# Patient Record
Sex: Female | Born: 1945
Health system: Southern US, Community
[De-identification: ages and names within clinical notes are randomized; demographics above are authoritative.]

## PROBLEM LIST (undated history)

## (undated) DIAGNOSIS — M810 Age-related osteoporosis without current pathological fracture: Secondary | ICD-10-CM

## (undated) DIAGNOSIS — K219 Gastro-esophageal reflux disease without esophagitis: Secondary | ICD-10-CM

## (undated) DIAGNOSIS — M5030 Other cervical disc degeneration, unspecified cervical region: Secondary | ICD-10-CM

## (undated) DIAGNOSIS — H269 Unspecified cataract: Secondary | ICD-10-CM

## (undated) DIAGNOSIS — T7840XA Allergy, unspecified, initial encounter: Secondary | ICD-10-CM

## (undated) DIAGNOSIS — N200 Calculus of kidney: Secondary | ICD-10-CM

## (undated) DIAGNOSIS — M1990 Unspecified osteoarthritis, unspecified site: Secondary | ICD-10-CM

## (undated) DIAGNOSIS — N35919 Unspecified urethral stricture, male, unspecified site: Secondary | ICD-10-CM

## (undated) DIAGNOSIS — I4510 Unspecified right bundle-branch block: Secondary | ICD-10-CM

## (undated) DIAGNOSIS — R001 Bradycardia, unspecified: Secondary | ICD-10-CM

## (undated) DIAGNOSIS — F32A Depression, unspecified: Secondary | ICD-10-CM

## (undated) DIAGNOSIS — E7800 Pure hypercholesterolemia, unspecified: Secondary | ICD-10-CM

## (undated) DIAGNOSIS — F419 Anxiety disorder, unspecified: Secondary | ICD-10-CM

## (undated) DIAGNOSIS — R7303 Prediabetes: Secondary | ICD-10-CM

## (undated) DIAGNOSIS — I471 Supraventricular tachycardia, unspecified: Secondary | ICD-10-CM

## (undated) DIAGNOSIS — E039 Hypothyroidism, unspecified: Secondary | ICD-10-CM

## (undated) DIAGNOSIS — K317 Polyp of stomach and duodenum: Secondary | ICD-10-CM

## (undated) DIAGNOSIS — M778 Other enthesopathies, not elsewhere classified: Secondary | ICD-10-CM

## (undated) HISTORY — DX: Unspecified cataract: H26.9

## (undated) HISTORY — PX: BREAST SURGERY: SHX581

## (undated) HISTORY — PX: EYE SURGERY: SHX253

## (undated) HISTORY — DX: Anxiety disorder, unspecified: F41.9

## (undated) HISTORY — PX: THYROIDECTOMY, PARTIAL: SHX18

## (undated) HISTORY — DX: Other enthesopathies, not elsewhere classified: M77.8

## (undated) HISTORY — PX: HAMMER TOE SURGERY: SHX385

## (undated) HISTORY — PX: BREAST CYST ASPIRATION: SHX578

## (undated) HISTORY — PX: DILATION AND CURETTAGE OF UTERUS: SHX78

## (undated) HISTORY — PX: TONSILLECTOMY: SUR1361

## (undated) HISTORY — PX: BREAST BIOPSY: SHX20

## (undated) HISTORY — DX: Depression, unspecified: F32.A

## (undated) HISTORY — DX: Allergy, unspecified, initial encounter: T78.40XA

## (undated) HISTORY — DX: Unspecified right bundle-branch block: I45.10

---

## 1965-05-18 HISTORY — PX: OVARIAN CYST REMOVAL: SHX89

## 2000-11-01 ENCOUNTER — Other Ambulatory Visit: Admission: RE | Admit: 2000-11-01 | Discharge: 2000-11-01 | Payer: Self-pay | Admitting: Family Medicine

## 2004-03-17 ENCOUNTER — Encounter: Payer: Self-pay | Admitting: Orthopedic Surgery

## 2004-03-18 ENCOUNTER — Encounter: Payer: Self-pay | Admitting: Orthopedic Surgery

## 2004-04-17 ENCOUNTER — Encounter: Payer: Self-pay | Admitting: Orthopedic Surgery

## 2004-05-18 ENCOUNTER — Encounter: Payer: Self-pay | Admitting: Orthopedic Surgery

## 2004-06-23 ENCOUNTER — Ambulatory Visit: Payer: Self-pay | Admitting: Family Medicine

## 2005-04-23 ENCOUNTER — Ambulatory Visit: Payer: Self-pay | Admitting: Unknown Physician Specialty

## 2005-04-30 ENCOUNTER — Ambulatory Visit: Payer: Self-pay | Admitting: Unknown Physician Specialty

## 2005-07-29 ENCOUNTER — Ambulatory Visit: Payer: Self-pay | Admitting: Unknown Physician Specialty

## 2005-09-14 ENCOUNTER — Ambulatory Visit: Payer: Self-pay | Admitting: Unknown Physician Specialty

## 2005-11-02 ENCOUNTER — Ambulatory Visit: Payer: Self-pay

## 2005-11-06 ENCOUNTER — Ambulatory Visit: Payer: Self-pay

## 2006-04-26 ENCOUNTER — Ambulatory Visit: Payer: Self-pay

## 2006-12-03 ENCOUNTER — Ambulatory Visit: Payer: Self-pay | Admitting: Family Medicine

## 2007-01-03 ENCOUNTER — Ambulatory Visit: Payer: Self-pay | Admitting: Unknown Physician Specialty

## 2007-02-07 ENCOUNTER — Ambulatory Visit: Payer: Self-pay | Admitting: Family Medicine

## 2008-02-17 ENCOUNTER — Ambulatory Visit: Payer: Self-pay | Admitting: Family Medicine

## 2008-10-16 DIAGNOSIS — M810 Age-related osteoporosis without current pathological fracture: Secondary | ICD-10-CM | POA: Insufficient documentation

## 2008-10-16 DIAGNOSIS — E7800 Pure hypercholesterolemia, unspecified: Secondary | ICD-10-CM | POA: Insufficient documentation

## 2008-10-16 DIAGNOSIS — E010 Iodine-deficiency related diffuse (endemic) goiter: Secondary | ICD-10-CM | POA: Insufficient documentation

## 2009-02-12 ENCOUNTER — Other Ambulatory Visit: Payer: Self-pay | Admitting: Family Medicine

## 2009-02-22 ENCOUNTER — Ambulatory Visit: Payer: Self-pay | Admitting: Family Medicine

## 2009-04-30 ENCOUNTER — Other Ambulatory Visit: Payer: Self-pay | Admitting: Family Medicine

## 2009-05-06 ENCOUNTER — Ambulatory Visit: Payer: Self-pay | Admitting: General Practice

## 2009-06-28 ENCOUNTER — Emergency Department: Payer: Self-pay | Admitting: Emergency Medicine

## 2010-02-26 ENCOUNTER — Ambulatory Visit: Payer: Self-pay | Admitting: Family Medicine

## 2010-04-04 ENCOUNTER — Ambulatory Visit: Payer: Self-pay | Admitting: Family Medicine

## 2011-03-04 ENCOUNTER — Ambulatory Visit: Payer: Self-pay | Admitting: Family Medicine

## 2011-11-05 LAB — HM PAP SMEAR: HM Pap smear: NEGATIVE

## 2012-03-15 ENCOUNTER — Ambulatory Visit: Payer: Self-pay | Admitting: Family Medicine

## 2012-03-25 ENCOUNTER — Ambulatory Visit: Payer: Self-pay | Admitting: Unknown Physician Specialty

## 2012-03-25 LAB — HM COLONOSCOPY

## 2012-03-28 LAB — PATHOLOGY REPORT

## 2012-04-27 ENCOUNTER — Ambulatory Visit: Payer: Self-pay | Admitting: Family Medicine

## 2012-06-27 ENCOUNTER — Other Ambulatory Visit: Payer: Self-pay | Admitting: Family Medicine

## 2012-06-27 LAB — CBC WITH DIFFERENTIAL/PLATELET
Basophil #: 0 10*3/uL (ref 0.0–0.1)
Basophil %: 0.7 %
Eosinophil #: 0 10*3/uL (ref 0.0–0.7)
Eosinophil %: 0.8 %
HCT: 39.9 % (ref 35.0–47.0)
HGB: 13.6 g/dL (ref 12.0–16.0)
Lymphocyte #: 2 10*3/uL (ref 1.0–3.6)
Lymphocyte %: 33.6 %
MCH: 31 pg (ref 26.0–34.0)
MCHC: 34.1 g/dL (ref 32.0–36.0)
MCV: 91 fL (ref 80–100)
Monocyte #: 0.6 x10 3/mm (ref 0.2–0.9)
Monocyte %: 9.8 %
Neutrophil #: 3.2 10*3/uL (ref 1.4–6.5)
Neutrophil %: 55.1 %
Platelet: 285 10*3/uL (ref 150–440)
RBC: 4.39 10*6/uL (ref 3.80–5.20)
RDW: 12.8 % (ref 11.5–14.5)
WBC: 5.9 10*3/uL (ref 3.6–11.0)

## 2012-06-27 LAB — COMPREHENSIVE METABOLIC PANEL
Albumin: 3.9 g/dL (ref 3.4–5.0)
Alkaline Phosphatase: 124 U/L (ref 50–136)
Anion Gap: 3 — ABNORMAL LOW (ref 7–16)
BUN: 23 mg/dL — ABNORMAL HIGH (ref 7–18)
Bilirubin,Total: 0.3 mg/dL (ref 0.2–1.0)
Calcium, Total: 8.7 mg/dL (ref 8.5–10.1)
Chloride: 105 mmol/L (ref 98–107)
Co2: 31 mmol/L (ref 21–32)
Creatinine: 0.69 mg/dL (ref 0.60–1.30)
EGFR (African American): >60
EGFR (Non-African Amer.): >60
Glucose: 85 mg/dL (ref 65–99)
Osmolality: 280 (ref 275–301)
Potassium: 3.9 mmol/L (ref 3.5–5.1)
SGOT(AST): 20 U/L (ref 15–37)
SGPT (ALT): 18 U/L (ref 12–78)
Sodium: 139 mmol/L (ref 136–145)
Total Protein: 7.1 g/dL (ref 6.4–8.2)

## 2012-06-27 LAB — TSH: Thyroid Stimulating Horm: 3.64 u[IU]/mL

## 2012-06-27 LAB — T4, FREE: Free Thyroxine: 0.87 ng/dL (ref 0.76–1.46)

## 2013-01-20 ENCOUNTER — Ambulatory Visit: Payer: Self-pay | Admitting: Gastroenterology

## 2013-03-16 ENCOUNTER — Ambulatory Visit: Payer: Self-pay | Admitting: Family Medicine

## 2014-02-01 ENCOUNTER — Ambulatory Visit: Payer: Self-pay | Admitting: Family Medicine

## 2014-02-01 LAB — LIPID PANEL
Cholesterol: 205 mg/dL — ABNORMAL HIGH (ref 0–200)
HDL Cholesterol: 56 mg/dL (ref 40–60)
Ldl Cholesterol, Calc: 117 mg/dL — ABNORMAL HIGH (ref 0–100)
Triglycerides: 160 mg/dL (ref 0–200)
VLDL Cholesterol, Calc: 32 mg/dL (ref 5–40)

## 2014-03-19 ENCOUNTER — Ambulatory Visit: Payer: Self-pay | Admitting: Family Medicine

## 2014-03-19 LAB — HM MAMMOGRAPHY

## 2014-09-07 DIAGNOSIS — M1990 Unspecified osteoarthritis, unspecified site: Secondary | ICD-10-CM | POA: Insufficient documentation

## 2014-09-07 DIAGNOSIS — F419 Anxiety disorder, unspecified: Secondary | ICD-10-CM | POA: Insufficient documentation

## 2014-09-07 DIAGNOSIS — G4700 Insomnia, unspecified: Secondary | ICD-10-CM | POA: Insufficient documentation

## 2014-09-07 DIAGNOSIS — R42 Dizziness and giddiness: Secondary | ICD-10-CM | POA: Insufficient documentation

## 2014-09-07 DIAGNOSIS — R928 Other abnormal and inconclusive findings on diagnostic imaging of breast: Secondary | ICD-10-CM | POA: Insufficient documentation

## 2014-09-07 DIAGNOSIS — R7303 Prediabetes: Secondary | ICD-10-CM | POA: Insufficient documentation

## 2014-09-07 DIAGNOSIS — J309 Allergic rhinitis, unspecified: Secondary | ICD-10-CM | POA: Insufficient documentation

## 2014-09-07 DIAGNOSIS — F32A Depression, unspecified: Secondary | ICD-10-CM | POA: Insufficient documentation

## 2014-09-07 DIAGNOSIS — F329 Major depressive disorder, single episode, unspecified: Secondary | ICD-10-CM | POA: Insufficient documentation

## 2014-09-07 DIAGNOSIS — R9431 Abnormal electrocardiogram [ECG] [EKG]: Secondary | ICD-10-CM | POA: Insufficient documentation

## 2014-10-08 ENCOUNTER — Other Ambulatory Visit: Payer: Self-pay

## 2014-10-08 NOTE — Patient Outreach (Signed)
[facility]) Care Management  [place]  10/08/2014   Sonya Barnes 1945-12-14 [id number]  Subjective: Patient comes to me today, pale and talking the entire time about the stress she's under.  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  Deziya forgot her blood sugar records but reports them as 80-90 in the am and 80-184m/dl, 2 hours after a meal.  She is not exercising or eating healthy like she normally does.  Objective: weight 139.5lbs, BP 132/68  Current Medications:  Current Outpatient Prescriptions  Medication Sig Dispense Refill  . ALPRAZolam (XANAX) 0.5 MG tablet Take 1 tablet by mouth daily. PRN    . cetirizine (ZYRTEC) 10 MG tablet Take 1 tablet by mouth daily.    . Diphenhydramine-APAP, sleep, 25-500 MG CAPS Take 1 tablet by mouth at bedtime as needed.    . fluticasone (FLONASE) 50 MCG/ACT nasal spray Place 2 sprays into the nose daily. 2 sprays each nostril    . Omeprazole 20 MG TBEC Take 1 capsule by mouth daily.    . sertraline (ZOLOFT) 50 MG tablet Take 1.5 tablets by mouth daily.    . Blood Glucose Monitoring Suppl (ACCU-CHEK AVIVA CONNECT) W/DEVICE KIT ACCU-CHEK AVIVA (In Vitro Strip)  1 Strip Use to check blood sugar daily as instructed for 0 days  Quantity: 50;  Refills: 2   Ordered :22-May-2014  MMargarita RanaMD;  Started 22-May-2014 Active    . NEOMYCIN-POLYMYXIN-HYDROCORTISONE (CORTISPORIN) 1 % SOLN otic solution Place in ear(s).    . ranitidine (ZANTAC) 150 MG tablet Take 1 tablet by mouth daily. As needed     No current facility-administered medications for this visit.    Functional Status:  In your present state of health, do you have any difficulty performing the following activities: 10/08/2014  Hearing? N  Vision? N  Difficulty concentrating or making decisions? N   Walking or climbing stairs? N  Dressing or bathing? N  Doing errands, shopping? N    Fall/Depression Screening: PHQ 2/9 Scores 10/08/2014  PHQ - 2 Score 2    Assessment/plan: patient is emotionally drained and not willing to add exercise at this time.  She is not following the meal plan guidelines for diabetes and is not willing to make changes at this time.  She will discuss her feelings of depression with MD at the next visit and may discuss the option of additional medications or increasing her dose of Zoloft at her next MD visit.   DMayceeis [facility] therefore I will not see her again.   [facility]        Patient Outreach from 10/08/2014 in TAnnaProblem One  Potential for elevated blood sugars as a result of stress   Care Plan for Problem One  Active   [facility] Long Term Goal (31-90 days)  Patient will maintain an A1C less than 6%   [facility] Long Term Goal Start Date  10/08/14   Interventions for Problem One Long Term Goal  - continue to check blood sugars through the week [Patient is emotionally drained at this time- not willing to make dietary or exercise changes at this time. ]      JGentry Fitz RN, BA, MHA,  [place] Direct Dial:  [phone]  Fax:  [phone] E-mail: [email or web address] [address], [place], [place]  [address]

## 2014-10-25 ENCOUNTER — Ambulatory Visit (INDEPENDENT_AMBULATORY_CARE_PROVIDER_SITE_OTHER): Payer: 59 | Admitting: Family Medicine

## 2014-10-25 ENCOUNTER — Encounter: Payer: Self-pay | Admitting: Family Medicine

## 2014-10-25 VITALS — BP 110/64 | HR 58 | Temp 97.7°F | Resp 16 | Ht 65.0 in | Wt 138.0 lb

## 2014-10-25 DIAGNOSIS — E049 Nontoxic goiter, unspecified: Secondary | ICD-10-CM | POA: Diagnosis not present

## 2014-10-25 DIAGNOSIS — E7800 Pure hypercholesterolemia, unspecified: Secondary | ICD-10-CM

## 2014-10-25 DIAGNOSIS — R739 Hyperglycemia, unspecified: Secondary | ICD-10-CM

## 2014-10-25 DIAGNOSIS — E010 Iodine-deficiency related diffuse (endemic) goiter: Secondary | ICD-10-CM

## 2014-10-25 DIAGNOSIS — Z0000 Encounter for general adult medical examination without abnormal findings: Secondary | ICD-10-CM | POA: Diagnosis not present

## 2014-10-25 NOTE — Progress Notes (Signed)
Patient ID: Sonya Barnes, female   DOB: 10/25/1945, 69 y.o.   MRN: [id number] Patient: Sonya Barnes, Female    DOB: 08/22/1945, 69 y.o.   MRN: [id number] Visit Date: 10/25/2014  Today's Provider: Nancy Maloney, MD   Chief Complaint  Patient presents with  . Medicare Wellness   Subjective:    Annual wellness visit Sonya Barnes is a 69 y.o. female who presents today for her Subsequent Annual Wellness Visit. She feels well. She reports exercising 3 times a week. She reports she is sleeping fairly well. 01/26/14 CPE 03/25/12 Colonoscopy- diverticulosis 03/19/14 mammo-BI-RADS 1 11/05/11 Pap-neg HPV-NEG 09/24/10 EKG  -----------------------------------------------------------   Review of Systems  Constitutional: Negative.   HENT: Negative.   Eyes: Negative.   Respiratory: Negative.   Cardiovascular: Negative.   Gastrointestinal: Negative.   Endocrine: Negative.   Genitourinary: Negative.   Musculoskeletal: Negative.   Skin: Negative.   Allergic/Immunologic: Negative.   Neurological: Negative.   Hematological: Negative.   Psychiatric/Behavioral: Negative.     History   Social History  . Marital Status: Married    Spouse Name: Mike  . Number of Children: 2  . Years of Education: N/A   Occupational History  . Full time [employer]   Social History Main Topics  . Smoking status: Never Smoker   . Smokeless tobacco: Never Used  . Alcohol Use: No  . Drug Use: No  . Sexual Activity: Not on file   Other Topics Concern  . Not on file   Social History Narrative    Patient Active Problem List   Diagnosis Date Noted  . Abnormal ECG 09/07/2014  . Abnormal finding on mammography 09/07/2014  . Allergic rhinitis 09/07/2014  . Anxiety 09/07/2014  . Arthritis 09/07/2014  . Clinical depression 09/07/2014  . Blood glucose elevated 09/07/2014  . Cannot sleep 09/07/2014  . Head revolving around 09/07/2014  . Bone/cartilage disorder 10/16/2008  . Big thyroid 10/16/2008  .  Hypercholesteremia 10/16/2008    Past Surgical History  Procedure Laterality Date  . Total thyroidectomy Right 2007  . Ovarian cyst removal  1967  . Tonsillectomy    . Hammer toe surgery Bilateral   . Breast surgery Left     biopsy    Her family history includes Arthritis in her mother; Cancer in her mother; Diabetes in her father; Early death in her brother; Heart disease in her father and sister; Liver disease in her brother; Lung disease in her mother and sister; Mental illness in her brother; Stroke in her father.    Previous Medications   ALPRAZOLAM (XANAX) 0.5 MG TABLET    Take 1 tablet by mouth daily. PRN   BLOOD GLUCOSE MONITORING SUPPL (ACCU-CHEK AVIVA CONNECT) W/DEVICE KIT    ACCU-CHEK AVIVA (In Vitro Strip)  1 Strip Use to check blood sugar daily as instructed for 0 days  Quantity: 50;  Refills: 2   Ordered :22-May-2014  Maloney, Nancy MD;  Started 22-May-2014 Active   CETIRIZINE (ZYRTEC) 10 MG TABLET    Take 1 tablet by mouth daily.   DIPHENHYDRAMINE-APAP, SLEEP, 25-500 MG CAPS    Take 1 tablet by mouth at bedtime as needed.   FLUTICASONE (FLONASE) 50 MCG/ACT NASAL SPRAY    Place 2 sprays into the nose daily. 2 sprays each nostril   NEOMYCIN-POLYMYXIN-HYDROCORTISONE (CORTISPORIN) 1 % SOLN OTIC SOLUTION    Place in ear(s).   OMEPRAZOLE 20 MG TBEC    Take 1 capsule by mouth daily.   RANITIDINE (ZANTAC) 150 MG   TABLET    Take 1 tablet by mouth daily. As needed   SERTRALINE (ZOLOFT) 50 MG TABLET    Take 1.5 tablets by mouth daily.    Patient Care Team: Nancy Maloney, MD as PCP - General (Family Medicine) Julie M Montpellier, RN as Registered Nurse    No Known Allergies   Objective:   Vitals: BP 110/64 mmHg  Pulse 58  Temp(Src) 97.7 F (36.5 C) (Oral)  Resp 16  Ht 5' 5" (1.651 m)  Wt 138 lb (62.596 kg)  BMI 22.96 kg/m2  SpO2 97%  Physical Exam  Constitutional: She is oriented to person, place, and time. She appears well-developed and well-nourished.  HENT:  Head:  Normocephalic and atraumatic.  Right Ear: Tympanic membrane, external ear and ear canal normal.  Left Ear: Tympanic membrane, external ear and ear canal normal.  Nose: Nose normal.  Mouth/Throat: Uvula is midline, oropharynx is clear and moist and mucous membranes are normal.  Eyes: Conjunctivae, EOM and lids are normal. Pupils are equal, round, and reactive to light.  Neck: Trachea normal and normal range of motion. Neck supple. Carotid bruit is not present. No thyroid mass and no thyromegaly present.  Cardiovascular: Normal rate, regular rhythm and normal heart sounds.   Pulmonary/Chest: Effort normal and breath sounds normal.  Abdominal: Soft. Normal appearance and bowel sounds are normal. There is no hepatosplenomegaly. There is no tenderness.  Genitourinary: No breast swelling, tenderness or discharge.  Musculoskeletal: Normal range of motion.  Lymphadenopathy:    She has no cervical adenopathy.    She has no axillary adenopathy.  Neurological: She is alert and oriented to person, place, and time. She has normal strength. No cranial nerve deficit.  Skin: Skin is warm, dry and intact.  Psychiatric: She has a normal mood and affect. Her speech is normal and behavior is normal. Judgment and thought content normal. Cognition and memory are normal.    Activities of Daily Living In your present state of health, do you have any difficulty performing the following activities: 10/25/2014 10/08/2014  Hearing? N N  Vision? N N  Difficulty concentrating or making decisions? N N  Walking or climbing stairs? N N  Dressing or bathing? N N  Doing errands, shopping? N N    Fall Risk Assessment Fall Risk  10/25/2014 10/08/2014  Falls in the past year? No No     Depression Screen PHQ 2/9 Scores 10/25/2014 10/08/2014  PHQ - 2 Score 0 2    Cognitive Testing - 6-CIT    Year: 0 4 points  Month: 0 3 points  Memorize "[place], [place], [address], [place]"  Time (within 1 hour:) 0 3 points  Count  backwars from 20: 0 2 4 points  Name months of year: 0 2 4 points  Repeat Address: 0 2 4 6 8 10 points   Total Score: 6/28  Interpretation : Normal (0-7) Abnormal (8-28)    Assessment & Plan:    1. Medicare annual wellness visit, subsequent       Stable. Patient advised to continue eating healthy and exercise daily.  2. Hypercholesteremia  - CBC with Differential/Platelet - Lipid panel  3. Blood glucose elevated  - Comprehensive metabolic panel - Hemoglobin A1c  4. Big thyroid  - TSH   Annual Wellness Visit  Reviewed patient's Family Medical History Reviewed and updated list of patient's medical providers Assessment of cognitive impairment was done Assessed patient's functional ability Established a written schedule for health screening services Health Risk   Assessent Completed and Reviewed  Exercise Activities and Dietary recommendations Goals    . Exercise 150 minutes per week (moderate activity)       Immunization History  Administered Date(s) Administered  . Pneumococcal Conjugate-13 01/26/2014  . Zoster 12/24/2010    Health Maintenance  Topic Date Due  . Samul Dada  10/13/1964  . DEXA SCAN  10/14/2010  . INFLUENZA VACCINE  12/17/2014  . PNA vac Low Risk Adult (2 of 2 - PPSV23) 01/27/2015  . MAMMOGRAM  03/19/2016  . COLONOSCOPY  03/25/2022  . ZOSTAVAX  Completed      Discussed health benefits of physical activity, and encouraged her to engage in regular exercise appropriate for her age and condition.    ------------------------------------------------------------------------------------------------------------    Patient seen and examined by Dr. Jerrell Belfast, and note scribed by Philbert Riser. Dimas, CMA.  I have reviewed the document for accuracy and completeness and I agree with above. Jerrell Belfast, MD

## 2014-10-26 ENCOUNTER — Telehealth: Payer: Self-pay

## 2014-10-26 ENCOUNTER — Other Ambulatory Visit
Admission: RE | Admit: 2014-10-26 | Discharge: 2014-10-26 | Disposition: A | Discharge status: Home / Self Care | Payer: 59 | Source: Ambulatory Visit | Attending: Family Medicine | Admitting: Family Medicine

## 2014-10-26 DIAGNOSIS — R7309 Other abnormal glucose: Secondary | ICD-10-CM | POA: Insufficient documentation

## 2014-10-26 DIAGNOSIS — E049 Nontoxic goiter, unspecified: Secondary | ICD-10-CM | POA: Insufficient documentation

## 2014-10-26 DIAGNOSIS — E780 Pure hypercholesterolemia: Secondary | ICD-10-CM | POA: Diagnosis not present

## 2014-10-26 LAB — CBC WITH DIFFERENTIAL/PLATELET
Basophils Absolute: 0.1 10*3/uL (ref 0–0.1)
Basophils Relative: 4 %
Eosinophils Absolute: 0.1 10*3/uL (ref 0–0.7)
Eosinophils Relative: 4 %
HCT: 39.4 % (ref 35.0–47.0)
Hemoglobin: 13.2 g/dL (ref 12.0–16.0)
Lymphocytes Relative: 23 %
Lymphs Abs: 0.9 10*3/uL — ABNORMAL LOW (ref 1.0–3.6)
MCH: 30.4 pg (ref 26.0–34.0)
MCHC: 33.4 g/dL (ref 32.0–36.0)
MCV: 90.8 fL (ref 80.0–100.0)
Monocytes Absolute: 0.5 10*3/uL (ref 0.2–0.9)
Monocytes Relative: 14 %
Neutro Abs: 2.2 10*3/uL (ref 1.4–6.5)
Neutrophils Relative %: 55 %
Platelets: 263 10*3/uL (ref 150–440)
RBC: 4.34 MIL/uL (ref 3.80–5.20)
RDW: 13.1 % (ref 11.5–14.5)
WBC: 3.9 10*3/uL (ref 3.6–11.0)

## 2014-10-26 LAB — COMPREHENSIVE METABOLIC PANEL
ALT: 25 U/L (ref 14–54)
AST: 34 U/L (ref 15–41)
Albumin: 4 g/dL (ref 3.5–5.0)
Alkaline Phosphatase: 172 U/L — ABNORMAL HIGH (ref 38–126)
Anion gap: 8 (ref 5–15)
BUN: 18 mg/dL (ref 6–20)
CO2: 28 mmol/L (ref 22–32)
Calcium: 9 mg/dL (ref 8.9–10.3)
Chloride: 105 mmol/L (ref 101–111)
Creatinine, Ser: 0.87 mg/dL (ref 0.44–1.00)
GFR calc Af Amer: >60 mL/min (ref >60)
GFR calc non Af Amer: >60 mL/min (ref >60)
Glucose, Bld: 96 mg/dL (ref 65–99)
Potassium: 4 mmol/L (ref 3.5–5.1)
Sodium: 141 mmol/L (ref 135–145)
Total Bilirubin: 0.4 mg/dL (ref 0.3–1.2)
Total Protein: 6.8 g/dL (ref 6.5–8.1)

## 2014-10-26 LAB — HEMOGLOBIN A1C: Hgb A1c MFr Bld: 5.7 % (ref 4.0–6.0)

## 2014-10-26 NOTE — Telephone Encounter (Signed)
Informed pt. Sonya Barnes, CMA  

## 2014-10-26 NOTE — Telephone Encounter (Signed)
-----   Message from Lorie Phenix, MD sent at 10/26/2014  4:10 PM EDT ----- HgbA1c looks good at 5.7. Please notify patient. Thanks.

## 2014-11-06 ENCOUNTER — Other Ambulatory Visit: Payer: Self-pay | Admitting: Family Medicine

## 2014-11-06 DIAGNOSIS — F32A Depression, unspecified: Secondary | ICD-10-CM

## 2014-11-06 DIAGNOSIS — F329 Major depressive disorder, single episode, unspecified: Secondary | ICD-10-CM

## 2014-11-06 NOTE — Telephone Encounter (Signed)
Last Refill 06/12/2014. LOV 10/25/2014. [name], CMA

## 2014-12-19 ENCOUNTER — Telehealth: Payer: Self-pay | Admitting: Family Medicine

## 2014-12-19 DIAGNOSIS — R748 Abnormal levels of other serum enzymes: Secondary | ICD-10-CM

## 2014-12-19 NOTE — Telephone Encounter (Signed)
Patient's Alkaline Phos was elevated at 172 on 10/26/14. You wanted her to have this repeated in 1 month. Patient wants to know why she should have this repeated? Please advise. Thanks!

## 2014-12-19 NOTE — Telephone Encounter (Signed)
Printed.  Thanks.  

## 2014-12-19 NOTE — Telephone Encounter (Signed)
Pt stated that she was supposed to have a lab rechecked this month and would like to get a lab slip. Pt request a nurse to call her back to verify which test she needs to have redone because she doesn't want to have another panel done. Thanks TNP

## 2015-01-14 ENCOUNTER — Other Ambulatory Visit
Admission: RE | Admit: 2015-01-14 | Discharge: 2015-01-14 | Disposition: A | Discharge status: Home / Self Care | Payer: 59 | Source: Ambulatory Visit | Attending: Family Medicine | Admitting: Family Medicine

## 2015-01-14 DIAGNOSIS — R748 Abnormal levels of other serum enzymes: Secondary | ICD-10-CM | POA: Diagnosis not present

## 2015-01-14 LAB — COMPREHENSIVE METABOLIC PANEL
ALT: 17 U/L (ref 14–54)
AST: 25 U/L (ref 15–41)
Albumin: 4.2 g/dL (ref 3.5–5.0)
Alkaline Phosphatase: 136 U/L — ABNORMAL HIGH (ref 38–126)
Anion gap: 8 (ref 5–15)
BUN: 20 mg/dL (ref 6–20)
CO2: 29 mmol/L (ref 22–32)
Calcium: 9.5 mg/dL (ref 8.9–10.3)
Chloride: 101 mmol/L (ref 101–111)
Creatinine, Ser: 0.8 mg/dL (ref 0.44–1.00)
GFR calc Af Amer: >60 mL/min (ref >60)
GFR calc non Af Amer: >60 mL/min (ref >60)
Glucose, Bld: 88 mg/dL (ref 65–99)
Potassium: 4.1 mmol/L (ref 3.5–5.1)
Sodium: 138 mmol/L (ref 135–145)
Total Bilirubin: 0.5 mg/dL (ref 0.3–1.2)
Total Protein: 7.1 g/dL (ref 6.5–8.1)

## 2015-01-15 ENCOUNTER — Telehealth: Payer: Self-pay

## 2015-01-15 NOTE — Telephone Encounter (Signed)
Pt advised as directed below.   Thanks,   -Laura  

## 2015-01-15 NOTE — Telephone Encounter (Signed)
-----   Message from Lorie Phenix, MD sent at 01/15/2015  9:36 AM EDT ----- Liver enzyme almost back to normal. Much better than previous reading.  Only slightly above normal. Recommend continue to monitor, with recheck in 2 to 3 months for stability. No need for further investigation at this time.  Sometimes any slight liver stress can make it go up and then returns to normal. Not concerning at this point. Thanks.

## 2015-02-15 ENCOUNTER — Ambulatory Visit (INDEPENDENT_AMBULATORY_CARE_PROVIDER_SITE_OTHER): Payer: 59 | Admitting: Family Medicine

## 2015-02-15 DIAGNOSIS — Z23 Encounter for immunization: Secondary | ICD-10-CM

## 2015-02-18 NOTE — Progress Notes (Signed)
Vaccine given in office.

## 2015-03-04 NOTE — Patient Outreach (Signed)
[facility]) Care Management  03/04/2015  Sonya Barnes 1945/11/26 [id number]   [name] has retired from [employer] and is no longer eligible for the Link to Wellness diabetes management program.   Susette RacerJulie Johnnay Pleitez, RN, BA, MHA, CDE [facility] Diabetes Coordinator- Link To Wellness Direct Dial:  [phone]  Fax:  [phone] E-mail: [email or web address] [address], [place], [place]  [id number]

## 2015-03-11 ENCOUNTER — Other Ambulatory Visit: Payer: Self-pay | Admitting: Family Medicine

## 2015-03-11 DIAGNOSIS — R739 Hyperglycemia, unspecified: Secondary | ICD-10-CM

## 2015-03-11 DIAGNOSIS — F419 Anxiety disorder, unspecified: Secondary | ICD-10-CM

## 2015-03-11 MED ORDER — ALPRAZOLAM 0.5 MG PO TABS: 0.5000 mg | ORAL_TABLET | Freq: Two times a day (BID) | ORAL | Status: DC | PRN

## 2015-03-11 NOTE — Telephone Encounter (Signed)
Called medication in to the pharmacy per verbal order from Dr. Elease HashimotoMaloney. Allene DillonEmily Drozdowski, CMA

## 2015-03-11 NOTE — Telephone Encounter (Signed)
OK to call in rx. Thanks.  

## 2015-03-11 NOTE — Telephone Encounter (Signed)
Pt called wanting refill on her generic xanax.  She needs a three month refill because her insurance will terminate next month.    She ask that someone please call her and let her know if and when the refill has been sent in.  Her call back is [phone]  [facility]

## 2015-03-11 NOTE — Telephone Encounter (Signed)
Called in rx. Informed pt. Allene DillonEmily Drozdowski, CMA

## 2015-06-10 ENCOUNTER — Other Ambulatory Visit: Payer: Self-pay | Admitting: Family Medicine

## 2015-06-10 DIAGNOSIS — K219 Gastro-esophageal reflux disease without esophagitis: Secondary | ICD-10-CM

## 2015-06-10 MED ORDER — OMEPRAZOLE 40 MG PO CPDR: 40.0000 mg | DELAYED_RELEASE_CAPSULE | Freq: Every day | ORAL | Status: DC

## 2015-06-10 NOTE — Telephone Encounter (Signed)
Pt contacted office for refill request on the following medications:  Omeprazole 20 MG TBEC.   Pt states [facility] gave this to pt.  Pt states the last Rx is for  not .  [employer] [facility]/phone #[phone]-fax #[phone].  Pt states you will need her ID #[id number].  CB#[id number].    This is a Dr Elease Hashimoto patient/MW

## 2015-10-21 DIAGNOSIS — H524 Presbyopia: Secondary | ICD-10-CM | POA: Diagnosis not present

## 2015-10-23 ENCOUNTER — Ambulatory Visit (INDEPENDENT_AMBULATORY_CARE_PROVIDER_SITE_OTHER): Payer: Medicare HMO | Admitting: Family Medicine

## 2015-10-23 ENCOUNTER — Encounter: Payer: Self-pay | Admitting: Family Medicine

## 2015-10-23 VITALS — BP 102/60 | HR 64 | Temp 98.2°F | Resp 16 | Ht 65.0 in | Wt 135.0 lb

## 2015-10-23 DIAGNOSIS — E7800 Pure hypercholesterolemia, unspecified: Secondary | ICD-10-CM | POA: Diagnosis not present

## 2015-10-23 DIAGNOSIS — F419 Anxiety disorder, unspecified: Secondary | ICD-10-CM | POA: Diagnosis not present

## 2015-10-23 DIAGNOSIS — Z0000 Encounter for general adult medical examination without abnormal findings: Secondary | ICD-10-CM

## 2015-10-23 DIAGNOSIS — Z780 Asymptomatic menopausal state: Secondary | ICD-10-CM | POA: Diagnosis not present

## 2015-10-23 DIAGNOSIS — R739 Hyperglycemia, unspecified: Secondary | ICD-10-CM | POA: Diagnosis not present

## 2015-10-23 DIAGNOSIS — Z1239 Encounter for other screening for malignant neoplasm of breast: Secondary | ICD-10-CM | POA: Diagnosis not present

## 2015-10-23 DIAGNOSIS — E049 Nontoxic goiter, unspecified: Secondary | ICD-10-CM

## 2015-10-23 DIAGNOSIS — Z124 Encounter for screening for malignant neoplasm of cervix: Secondary | ICD-10-CM

## 2015-10-23 DIAGNOSIS — R69 Illness, unspecified: Secondary | ICD-10-CM | POA: Diagnosis not present

## 2015-10-23 DIAGNOSIS — Z1159 Encounter for screening for other viral diseases: Secondary | ICD-10-CM | POA: Diagnosis not present

## 2015-10-23 DIAGNOSIS — E010 Iodine-deficiency related diffuse (endemic) goiter: Secondary | ICD-10-CM

## 2015-10-23 LAB — POCT URINALYSIS DIPSTICK
Bilirubin, UA: NEGATIVE
Blood, UA: NEGATIVE
Glucose, UA: NEGATIVE
Ketones, UA: NEGATIVE
Leukocytes, UA: NEGATIVE
Nitrite, UA: NEGATIVE
Protein, UA: NEGATIVE
Spec Grav, UA: 1.025
Urobilinogen, UA: 0.2
pH, UA: 5

## 2015-10-23 MED ORDER — ESCITALOPRAM OXALATE 5 MG PO TABS: 5.0000 mg | ORAL_TABLET | Freq: Every day | ORAL | Status: DC

## 2015-10-23 NOTE — Addendum Note (Signed)
Addended by: Hyacinth MeekerIMAS, Hitomi Slape S on: 10/23/2015 04:42 PM   Modules accepted: Orders

## 2015-10-23 NOTE — Patient Instructions (Signed)
Please call the [facility] at [facility] to schedule this at [phone]   

## 2015-10-23 NOTE — Progress Notes (Signed)
Patient ID: [name], female   DOB: August 20, 1945, 70 y.o.   MRN: [id number]       Patient: Sonya Barnes, Female    DOB: Apr 26, 1946, 70 y.o.   MRN: [id number] Visit Date: 10/23/2015  Today's Provider: Margarita Rana, MD   Chief Complaint  Patient presents with  . Medicare Wellness  . Anxiety   Subjective:    Annual wellness visit Sonya Barnes is a 70 y.o. female. She feels well. She reports exercising 3-4 days a week. She reports she is sleeping poorly.  10/25/14 AWE 11/05/11 Pap-neg; HPV-neg 03/19/14 Mammogram-BI-RADS 1 03/25/12 Colonoscopy-diverticulosis, internal hemorrhoids, Dr. Vira Agar, recheck in 03/2017 09/24/10 EKG -----------------------------------------------------------  Anxiety: Patient reports that she has been have more irritability since stopping sertraline. Patient reports that she was having shortness of breath and tightness. Patient reports that shortness of breath and tightness has improved since discontinuing sertraline.  Review of Systems  Constitutional: Positive for appetite change.  HENT: Negative.   Eyes: Negative.   Respiratory: Negative.   Cardiovascular: Negative.   Gastrointestinal: Positive for constipation and abdominal distention.  Endocrine: Negative.   Genitourinary: Negative.   Musculoskeletal: Positive for back pain and neck stiffness.  Skin: Negative.   Allergic/Immunologic: Negative.   Neurological: Negative.   Hematological: Negative.   Psychiatric/Behavioral: Positive for agitation. The patient is nervous/anxious.     Social History   Social History  . Marital Status: Married    Spouse Name: Ronalee Belts  . Number of Children: 2  . Years of Education: N/A   Occupational History  . Full time [employer]   Social History Main Topics  . Smoking status: Never Smoker   . Smokeless tobacco: Never Used  . Alcohol Use: No  . Drug Use: No  . Sexual Activity: Not on file   Other Topics Concern  . Not on file   Social History Narrative     Past Medical History  Diagnosis Date  . Tendonitis of elbow, right   . Bundle branch block, right   . Allergy   . Anxiety      Patient Active Problem List   Diagnosis Date Noted  . Acid reflux 06/10/2015  . Abnormal ECG 09/07/2014  . Abnormal finding on mammography 09/07/2014  . Allergic rhinitis 09/07/2014  . Anxiety 09/07/2014  . Arthritis 09/07/2014  . Clinical depression 09/07/2014  . Blood glucose elevated 09/07/2014  . Cannot sleep 09/07/2014  . Head revolving around 09/07/2014  . Bone/cartilage disorder 10/16/2008  . Big thyroid 10/16/2008  . Hypercholesteremia 10/16/2008    Past Surgical History  Procedure Laterality Date  . Total thyroidectomy Right 2007  . Ovarian cyst removal  1967  . Tonsillectomy    . Hammer toe surgery Bilateral   . Breast surgery Left     biopsy    Her family history includes Arthritis in her mother; Cancer in her mother; Diabetes in her father; Early death in her brother; Heart disease in her father and sister; Liver disease in her brother; Lung disease in her mother and sister; Mental illness in her brother; Stroke in her father.    Current Meds  Medication Sig  . ALPRAZolam (XANAX) 0.5 MG tablet Take 1 tablet (0.5 mg total) by mouth 2 (two) times daily as needed.  . Blood Glucose Monitoring Suppl (ACCU-CHEK AVIVA CONNECT) W/DEVICE KIT ACCU-CHEK AVIVA (In Vitro Strip)  1 Strip Use to check blood sugar daily as instructed for 0 days  Quantity: 50;  Refills: 2   Ordered :22-May-2014  Margarita Rana MD;  Started 22-May-2014 Active  . cetirizine (ZYRTEC) 10 MG tablet Take 1 tablet by mouth daily.  . fluticasone (FLONASE) 50 MCG/ACT nasal spray Place 2 sprays into the nose daily. 2 sprays each nostril  . Multiple Vitamins-Minerals (MULTIVITAL PO) Take 1 tablet by mouth daily.  Marland Kitchen omeprazole (PRILOSEC) 40 MG capsule Take 1 capsule (40 mg total) by mouth daily.  . ranitidine (ZANTAC) 150 MG tablet Take 1 tablet by mouth daily. As needed     Patient Care Team: Margarita Rana, MD as PCP - General (Family Medicine)   .MEDST  Objective:   Vitals: BP 102/60 mmHg  Pulse 64  Temp(Src) 98.2 F (36.8 C) (Oral)  Resp 16  Ht 5' 5"  (1.651 m)  Wt 135 lb (61.236 kg)  BMI 22.47 kg/m2  Physical Exam  Constitutional: She is oriented to person, place, and time. She appears well-developed and well-nourished.  HENT:  Head: Normocephalic and atraumatic.  Right Ear: Tympanic membrane, external ear and ear canal normal.  Left Ear: Tympanic membrane, external ear and ear canal normal.  Nose: Nose normal.  Mouth/Throat: Uvula is midline, oropharynx is clear and moist and mucous membranes are normal.  Eyes: Conjunctivae, EOM and lids are normal. Pupils are equal, round, and reactive to light.  Neck: Trachea normal and normal range of motion. Neck supple. Carotid bruit is not present. No thyroid mass and no thyromegaly present.  Cardiovascular: Normal rate, regular rhythm and normal heart sounds.   Pulmonary/Chest: Effort normal and breath sounds normal.  Abdominal: Soft. Normal appearance and bowel sounds are normal. There is no hepatosplenomegaly. There is no tenderness.  Genitourinary: Vagina normal. No breast swelling, tenderness or discharge.  Musculoskeletal: Normal range of motion.  Lymphadenopathy:    She has no cervical adenopathy.    She has no axillary adenopathy.  Neurological: She is alert and oriented to person, place, and time. She has normal strength. No cranial nerve deficit.  Skin: Skin is warm, dry and intact.  Psychiatric: She has a normal mood and affect. Her speech is normal and behavior is normal. Judgment and thought content normal. Cognition and memory are normal.    Activities of Daily Living In your present state of health, do you have any difficulty performing the following activities: 10/23/2015 10/25/2014  Hearing? N N  Vision? N N  Difficulty concentrating or making decisions? N N  Walking or climbing  stairs? N N  Dressing or bathing? N N  Doing errands, shopping? N N    Fall Risk Assessment Fall Risk  10/23/2015 10/25/2014 10/08/2014  Falls in the past year? No No No     Depression Screen PHQ 2/9 Scores 10/23/2015 10/23/2015 10/25/2014 10/08/2014  PHQ - 2 Score 2 2 0 2  PHQ- 9 Score 11 - - -    Cognitive Testing - 6-CIT  Correct? Score   What year is it? yes 0 0 or 4  What month is it? yes 0 0 or 3  Memorize:    Pia Mau,  [birth year],  [place],      What time is it? (within 1 hour) yes 0 0 or 3  Count backwards from 20 no 1 0, 2, or 4  Name the months of the year yes 0 0, 2, or 4  Repeat name & address above no 2 0, 2, 4, 6, 8, or 10       TOTAL SCORE  3/28   Interpretation:  Normal  Normal (0-7)  Abnormal (8-28)       Assessment & Plan:     Annual Wellness Visit  Reviewed patient's Family Medical History Reviewed and updated list of patient's medical providers Assessment of cognitive impairment was done Assessed patient's functional ability Established a written schedule for health screening services Health Risk Assessment Completed and Reviewed  Exercise Activities and Dietary recommendations Goals    . Exercise 150 minutes per week (moderate activity)       Immunization History  Administered Date(s) Administered  . Influenza-Unspecified 01/17/2015  . Pneumococcal Conjugate-13 01/26/2014  . Pneumococcal Polysaccharide-23 02/15/2015  . Zoster 12/24/2010      1. Medicare annual wellness visit, subsequent Stable. Patient advised to continue eating healthy and exercise daily.  2. Anxiety Worsening. Patient started on escitalopram 5 mg daily as below.  Patient instructed to call back if condition worsens or does not improve.    - escitalopram (LEXAPRO) 5 MG tablet; Take 1 tablet (5 mg total) by mouth daily.  Dispense: 30 tablet; Refill: 5  3. Pap smear for cervical cancer screening - Pap IG (Image Guided)  4. Breast cancer screening - MM DIGITAL  SCREENING BILATERAL; Future  5. Big thyroid - TSH  6. Blood glucose elevated - Hemoglobin A1c  7. Hypercholesteremia - CBC with Differential/Platelet - Comprehensive metabolic panel - Lipid Panel With LDL/HDL Ratio  8. Need for hepatitis C screening test - Hepatitis C antibody  9. Postmenopausal - DG Bone Density; Future    Patient seen and examined by Dr. Jerrell Belfast, and note scribed by Philbert Riser. Dimas, CMA.  I have reviewed the document for accuracy and completeness and I agree with above. Jerrell Belfast, MD  Margarita Rana, MD  ------------------------------------------------------------------------------------------------------------

## 2015-10-24 ENCOUNTER — Other Ambulatory Visit
Admission: RE | Admit: 2015-10-24 | Discharge: 2015-10-24 | Disposition: A | Discharge status: Home / Self Care | Payer: Medicare HMO | Source: Ambulatory Visit | Attending: Family Medicine | Admitting: Family Medicine

## 2015-10-24 DIAGNOSIS — E7800 Pure hypercholesterolemia, unspecified: Secondary | ICD-10-CM | POA: Diagnosis not present

## 2015-10-24 DIAGNOSIS — Z1159 Encounter for screening for other viral diseases: Secondary | ICD-10-CM | POA: Diagnosis not present

## 2015-10-24 DIAGNOSIS — R739 Hyperglycemia, unspecified: Secondary | ICD-10-CM | POA: Diagnosis not present

## 2015-10-24 DIAGNOSIS — E040 Nontoxic diffuse goiter: Secondary | ICD-10-CM | POA: Insufficient documentation

## 2015-10-24 LAB — TSH: TSH: 11.681 u[IU]/mL — ABNORMAL HIGH (ref 0.350–4.500)

## 2015-10-24 LAB — CBC WITH DIFFERENTIAL/PLATELET
Basophils Absolute: 0.1 10*3/uL (ref 0–0.1)
Basophils Relative: 1 %
Eosinophils Absolute: 0.1 10*3/uL (ref 0–0.7)
Eosinophils Relative: 2 %
HCT: 39.1 % (ref 35.0–47.0)
Hemoglobin: 13.3 g/dL (ref 12.0–16.0)
Lymphocytes Relative: 24 %
Lymphs Abs: 1.2 10*3/uL (ref 1.0–3.6)
MCH: 30.4 pg (ref 26.0–34.0)
MCHC: 34.1 g/dL (ref 32.0–36.0)
MCV: 89.1 fL (ref 80.0–100.0)
Monocytes Absolute: 0.5 10*3/uL (ref 0.2–0.9)
Monocytes Relative: 10 %
Neutro Abs: 3.2 10*3/uL (ref 1.4–6.5)
Neutrophils Relative %: 63 %
Platelets: 277 10*3/uL (ref 150–440)
RBC: 4.38 MIL/uL (ref 3.80–5.20)
RDW: 13.7 % (ref 11.5–14.5)
WBC: 5.1 10*3/uL (ref 3.6–11.0)

## 2015-10-24 LAB — COMPREHENSIVE METABOLIC PANEL
ALT: 14 U/L (ref 14–54)
AST: 24 U/L (ref 15–41)
Albumin: 4.4 g/dL (ref 3.5–5.0)
Alkaline Phosphatase: 109 U/L (ref 38–126)
Anion gap: 8 (ref 5–15)
BUN: 21 mg/dL — ABNORMAL HIGH (ref 6–20)
CO2: 28 mmol/L (ref 22–32)
Calcium: 9.3 mg/dL (ref 8.9–10.3)
Chloride: 104 mmol/L (ref 101–111)
Creatinine, Ser: 0.67 mg/dL (ref 0.44–1.00)
GFR calc Af Amer: >60 mL/min (ref >60)
GFR calc non Af Amer: >60 mL/min (ref >60)
Glucose, Bld: 83 mg/dL (ref 65–99)
Potassium: 4.2 mmol/L (ref 3.5–5.1)
Sodium: 140 mmol/L (ref 135–145)
Total Bilirubin: 0.6 mg/dL (ref 0.3–1.2)
Total Protein: 7 g/dL (ref 6.5–8.1)

## 2015-10-24 LAB — HEMOGLOBIN A1C: Hgb A1c MFr Bld: 5.7 % (ref 4.0–6.0)

## 2015-10-24 LAB — LIPID PANEL
Cholesterol: 241 mg/dL — ABNORMAL HIGH (ref 0–200)
HDL: 63 mg/dL (ref >40)
LDL Cholesterol: 166 mg/dL — ABNORMAL HIGH (ref 0–99)
Total CHOL/HDL Ratio: 3.8 RATIO
Triglycerides: 62 mg/dL (ref <150)
VLDL: 12 mg/dL (ref 0–40)

## 2015-10-25 ENCOUNTER — Telehealth: Payer: Self-pay

## 2015-10-25 LAB — HEPATITIS C ANTIBODY: HCV Ab: <0.1 s/co ratio (ref 0.0–0.9)

## 2015-10-25 NOTE — Telephone Encounter (Signed)
-----   Message from Lorie PhenixNancy Maloney, MD sent at 10/25/2015 10:52 AM EDT ----- Labs stable. Please notify patient. Thanks.

## 2015-10-25 NOTE — Telephone Encounter (Signed)
Advised pt of lab results. Pt verbally acknowledges understanding. Emily Drozdowski, CMA   

## 2015-10-26 ENCOUNTER — Encounter: Payer: Self-pay | Admitting: Family Medicine

## 2015-10-26 LAB — PAP IG (IMAGE GUIDED): PAP Smear Comment: 0

## 2015-10-28 ENCOUNTER — Telehealth: Payer: Self-pay

## 2015-10-28 NOTE — Telephone Encounter (Signed)
-----   Message from Lorie PhenixNancy Maloney, MD sent at 10/26/2015  6:47 AM EDT ----- Pap is normal. Please notify patient.

## 2015-10-28 NOTE — Telephone Encounter (Signed)
Patient advised as below.  

## 2015-10-29 MED ORDER — LEVOTHYROXINE SODIUM 50 MCG PO TABS: 50.0000 ug | ORAL_TABLET | Freq: Every day | ORAL | Status: DC

## 2015-10-29 NOTE — Telephone Encounter (Signed)
Pt is calling back about this, because she does not see a medication sent to her pharmacy. Thanks

## 2015-12-02 ENCOUNTER — Other Ambulatory Visit: Payer: Self-pay | Admitting: Family Medicine

## 2015-12-02 ENCOUNTER — Ambulatory Visit
Admission: RE | Admit: 2015-12-02 | Discharge: 2015-12-02 | Disposition: A | Discharge status: Home / Self Care | Payer: Medicare HMO | Source: Ambulatory Visit | Attending: Family Medicine | Admitting: Family Medicine

## 2015-12-02 DIAGNOSIS — M810 Age-related osteoporosis without current pathological fracture: Secondary | ICD-10-CM | POA: Insufficient documentation

## 2015-12-02 DIAGNOSIS — Z1231 Encounter for screening mammogram for malignant neoplasm of breast: Secondary | ICD-10-CM | POA: Insufficient documentation

## 2015-12-02 DIAGNOSIS — Z780 Asymptomatic menopausal state: Secondary | ICD-10-CM

## 2015-12-02 DIAGNOSIS — R69 Illness, unspecified: Secondary | ICD-10-CM | POA: Diagnosis not present

## 2015-12-02 DIAGNOSIS — Z8262 Family history of osteoporosis: Secondary | ICD-10-CM | POA: Diagnosis not present

## 2015-12-02 DIAGNOSIS — E039 Hypothyroidism, unspecified: Secondary | ICD-10-CM | POA: Diagnosis not present

## 2015-12-02 DIAGNOSIS — Z1239 Encounter for other screening for malignant neoplasm of breast: Secondary | ICD-10-CM

## 2015-12-06 ENCOUNTER — Encounter: Payer: Self-pay | Admitting: Family Medicine

## 2015-12-19 ENCOUNTER — Encounter: Payer: Self-pay | Admitting: Family Medicine

## 2015-12-20 ENCOUNTER — Telehealth: Payer: Self-pay

## 2015-12-20 DIAGNOSIS — K219 Gastro-esophageal reflux disease without esophagitis: Secondary | ICD-10-CM

## 2015-12-20 NOTE — Telephone Encounter (Signed)
She is at high risk of hip and spinae fractures. She needs to take at least 2000 units of vitamin D per day and 800mg  of calcium per day. She needs to do 30 minutes of weight bearing exercises at 3-4 times per week to help bone strength, and need to recheck bone density test in one year to make sure it is improving.

## 2015-12-20 NOTE — Telephone Encounter (Signed)
-----   Message from Malva Limes, MD sent at 12/19/2015 10:01 PM EDT ----- Bone density test shows severe osteoporosis in spine and moderate osteoporosis in hip. Recommend she start alendronate 70mg  once a week, #4, rf x 12 and follow up o.v. In 3 months.

## 2015-12-20 NOTE — Telephone Encounter (Signed)
Pt advised; She does not want to start a medication at this time.  She has started Calcium with Vitamin D OTC.   Thanks,   -Vernona Rieger

## 2015-12-23 ENCOUNTER — Other Ambulatory Visit: Payer: Self-pay | Admitting: *Deleted

## 2015-12-23 DIAGNOSIS — K219 Gastro-esophageal reflux disease without esophagitis: Secondary | ICD-10-CM

## 2015-12-23 MED ORDER — OMEPRAZOLE 40 MG PO CPDR: 40.0000 mg | DELAYED_RELEASE_CAPSULE | Freq: Every day | ORAL | 3 refills | Status: DC

## 2015-12-23 NOTE — Telephone Encounter (Signed)
Patient was notified. Patient expressed understanding. Patient stated that she is going to think about taking alendronate and if she decides to start, she will us back to let us know.

## 2016-01-07 ENCOUNTER — Encounter: Payer: Self-pay | Admitting: Family Medicine

## 2016-01-07 ENCOUNTER — Ambulatory Visit (INDEPENDENT_AMBULATORY_CARE_PROVIDER_SITE_OTHER): Payer: Medicare HMO | Admitting: Family Medicine

## 2016-01-07 ENCOUNTER — Other Ambulatory Visit
Admission: RE | Admit: 2016-01-07 | Discharge: 2016-01-07 | Disposition: A | Discharge status: Home / Self Care | Payer: Medicare HMO | Source: Ambulatory Visit | Attending: Family Medicine | Admitting: Family Medicine

## 2016-01-07 VITALS — BP 102/70 | HR 53 | Temp 98.4°F | Resp 16 | Ht 65.0 in | Wt 135.0 lb

## 2016-01-07 DIAGNOSIS — E890 Postprocedural hypothyroidism: Secondary | ICD-10-CM | POA: Diagnosis not present

## 2016-01-07 DIAGNOSIS — M810 Age-related osteoporosis without current pathological fracture: Secondary | ICD-10-CM | POA: Diagnosis not present

## 2016-01-07 LAB — TSH: TSH: 3.285 u[IU]/mL (ref 0.350–4.500)

## 2016-01-07 NOTE — Patient Instructions (Signed)
Osteoporosis  Osteoporosis is the thinning and loss of density in the bones. Osteoporosis makes the bones more brittle, fragile, and likely to break (fracture). Over time, osteoporosis can cause the bones to become so weak that they fracture after a simple fall. The bones most likely to fracture are the bones in the hip, wrist, and spine.  CAUSES   The exact cause is not known.  RISK FACTORS  Anyone can develop osteoporosis. You may be at greater risk if you have a family history of the condition or have poor nutrition. You may also have a higher risk if you are:   · Female.    · 50 years old or older.  · A smoker.  · Not physically active.    · White or Asian.  · Slender.  SIGNS AND SYMPTOMS   A fracture might be the first sign of the disease, especially if it results from a fall or injury that would not usually cause a bone to break. Other signs and symptoms include:   · Low back and neck pain.  · Stooped posture.  · Height loss.  DIAGNOSIS   To make a diagnosis, your health care provider may:  · Take a medical history.  · Perform a physical exam.  · Order tests, such as:    A bone mineral density test.    A dual-energy X-ray absorptiometry test.  TREATMENT   The goal of osteoporosis treatment is to strengthen your bones to reduce your risk of a fracture. Treatment may involve:  · Making lifestyle changes, such as:    Eating a diet rich in calcium.    Doing weight-bearing and muscle-strengthening exercises.    Stopping tobacco use.    Limiting alcohol intake.  · Taking medicine to slow the process of bone loss or to increase bone density.  · Monitoring your levels of calcium and vitamin D.  HOME CARE INSTRUCTIONS  · Include calcium and vitamin D in your diet. Calcium is important for bone health, and vitamin D helps the body absorb calcium.  · Perform weight-bearing and muscle-strengthening exercises as directed by your health care provider.  · Do not use any tobacco products, including cigarettes, chewing  tobacco, and electronic cigarettes. If you need help quitting, ask your health care provider.  · Limit your alcohol intake.  · Take medicines only as directed by your health care provider.  · Keep all follow-up visits as directed by your health care provider. This is important.  · Take precautions at home to lower your risk of falling, such as:    Keeping rooms well lit and clutter free.    Installing safety rails on stairs.    Using rubber mats in the bathroom and other areas that are often wet or slippery.  SEEK IMMEDIATE MEDICAL CARE IF:   You fall or injure yourself.      This information is not intended to replace advice given to you by your health care provider. Make sure you discuss any questions you have with your health care provider.     Document Released: 02/11/2005 Document Revised: 05/25/2014 Document Reviewed: 10/12/2013  Elsevier Interactive Patient Education ©2016 Else[employer] Inc[employer]

## 2016-01-07 NOTE — Progress Notes (Signed)
Patient: Sonya Barnes Female    DOB: [birth year]   70 y.o.   MRN: [id number] Visit Date: 01/07/2016  Today's Provider: Lelon Huh, MD   Chief Complaint  Patient presents with  . Results   Subjective:    HPI  This is a previous patient of Dr. Venia Minks present today as new patient to me to establish care and follow up on chronic medical problems.   Patient is here to discuss bone density results, from 12/02/2015. Test shows severe osteoporosis in spine and moderate osteoporosis in hip. Recommended she start alendronate 45m once a week, #4, rf x 12 and follow up o.v. in 3 months. She is very concerned due to family history of osteoporosis, but is reluctant to take prescription medications.  She is also here to follow up on hypothyroidism. Is tolerating levothyroxine. Lab Results  Component Value Date   TSH 11.681 (H) 10/24/2015   No change in medications were made after last checked in June.     No Known Allergies Current Meds  Medication Sig  . ALPRAZolam (XANAX) 0.5 MG tablet Take 1 tablet (0.5 mg total) by mouth 2 (two) times daily as needed.  . Biotin (BIOTIN 5000) 5 MG CAPS Take by mouth.  . Biotin 5000 MCG CAPS Take 5,000 mcg by mouth 3 (three) times daily.  . Blood Glucose Monitoring Suppl (ACCU-CHEK AVIVA CONNECT) W/DEVICE KIT ACCU-CHEK AVIVA (In Vitro Strip)  1 Strip Use to check blood sugar daily as instructed for 0 days  Quantity: 50;  Refills: 2   Ordered :22-May-2014  MMargarita RanaMD;  Started 22-May-2014 Active  . cetirizine (ZYRTEC) 10 MG tablet Take 1 tablet by mouth daily.  .Marland Kitchenescitalopram (LEXAPRO) 5 MG tablet Take 1 tablet (5 mg total) by mouth daily.  . fluticasone (FLONASE) 50 MCG/ACT nasal spray Place 2 sprays into the nose daily. 2 sprays each nostril  . levothyroxine (SYNTHROID, LEVOTHROID) 50 MCG tablet Take 1 tablet (50 mcg total) by mouth daily.  . Melatonin 1 MG TABS Take 1 mg by mouth at bedtime as needed.  .Marland Kitchenomeprazole (PRILOSEC) 40 MG capsule  Take 1 capsule (40 mg total) by mouth daily.  . ranitidine (ZANTAC) 150 MG tablet Take 1 tablet by mouth daily. As needed    Review of Systems  Constitutional: Negative for appetite change, chills, fatigue and fever.  Respiratory: Negative for chest tightness and shortness of breath.   Cardiovascular: Negative for chest pain and palpitations.  Gastrointestinal: Negative for abdominal pain, nausea and vomiting.  Neurological: Negative for dizziness and weakness.    Social History  Substance Use Topics  . Smoking status: Never Smoker  . Smokeless tobacco: Never Used  . Alcohol use No   Objective:   BP 102/70 (BP Location: Right Arm, Patient Position: Sitting, Cuff Size: Normal)   Pulse (!) 53   Temp 98.4 F (36.9 C) (Oral)   Resp 16   Ht 5' 5"  (1.651 m)   Wt 135 lb (61.2 kg)   SpO2 98%   BMI 22.47 kg/m   Physical Exam  General appearance: thin female, alert, well developed, well nourished, cooperative and in no distress Head: Normocephalic, without obvious abnormality, atraumatic Respiratory: Respirations even and unlabored, normal respiratory rate Extremities: No gross deformities Skin: Skin color, texture, turgor normal. No rashes seen  Psych: Appropriate mood and affect. Neurologic: Mental status: Alert, oriented to person, place, and time, thought content appropriate.     Assessment & Plan:  1. Osteoporosis Long discussion regarding causes, risks and various treatment options including weight bearing exercises, supplement, diet changes and prescriptions medication. She is reluctant to start prescription medication, but is at high risk for fractures. Will see how vitamin D levels look before deciding long term treatment.  - VITAMIN D 25 Hydroxy (Vit-D Deficiency, Fractures)  2. Hypothyroidism associated with surgical procedure Due for  - T4 AND TSH  Over half of this 30 minute visit were spent in counseling and coordinating care of multiple medical problems.        Lelon Huh, MD  [facility]

## 2016-01-08 ENCOUNTER — Telehealth: Payer: Self-pay | Admitting: *Deleted

## 2016-01-08 LAB — T4: T4, Total: 8.2 ug/dL (ref 4.5–12.0)

## 2016-01-08 LAB — VITAMIN D 25 HYDROXY (VIT D DEFICIENCY, FRACTURES): Vit D, 25-Hydroxy: 42.2 ng/mL (ref 30.0–100.0)

## 2016-01-08 MED ORDER — ALENDRONATE SODIUM 70 MG PO TABS: 70.0000 mg | ORAL_TABLET | ORAL | 4 refills | Status: DC

## 2016-01-08 NOTE — Telephone Encounter (Signed)
-----   Message from Malva Limesonald E Fisher, MD sent at 01/08/2016  7:57 AM EDT ----- Thyroid and vitamin D levels are normal. She should go ahead and start alendronate 70mg  weekly, #12, rf x 4 and continue current calcium and vitamin D supplement. Will need to recheck Bone density test in 2 years.

## 2016-01-08 NOTE — Telephone Encounter (Signed)
Patient was notified of results. Patient expressed understanding. Rx sent to pharmacy.  

## 2016-02-27 ENCOUNTER — Ambulatory Visit (INDEPENDENT_AMBULATORY_CARE_PROVIDER_SITE_OTHER): Payer: Medicare HMO

## 2016-02-27 DIAGNOSIS — Z23 Encounter for immunization: Secondary | ICD-10-CM | POA: Diagnosis not present

## 2016-03-09 ENCOUNTER — Encounter: Payer: Self-pay | Admitting: Family Medicine

## 2016-06-09 DIAGNOSIS — R69 Illness, unspecified: Secondary | ICD-10-CM | POA: Diagnosis not present

## 2016-06-23 ENCOUNTER — Ambulatory Visit (INDEPENDENT_AMBULATORY_CARE_PROVIDER_SITE_OTHER): Payer: Medicare HMO | Admitting: Family Medicine

## 2016-06-23 ENCOUNTER — Encounter: Payer: Self-pay | Admitting: Family Medicine

## 2016-06-23 VITALS — BP 100/70 | HR 53 | Temp 97.8°F | Resp 16 | Wt 137.0 lb

## 2016-06-23 DIAGNOSIS — J40 Bronchitis, not specified as acute or chronic: Secondary | ICD-10-CM

## 2016-06-23 MED ORDER — AZITHROMYCIN 250 MG PO TABS: ORAL_TABLET | ORAL | 0 refills | Status: AC

## 2016-06-23 NOTE — Patient Instructions (Signed)
   You can take OTC Mucinex (guaifenesin) for chest or sinus congestion and to help your cough

## 2016-06-23 NOTE — Progress Notes (Signed)
Patient: Sonya Barnes Female    DOB: August 17, 1945   71 y.o.   MRN: [id number] Visit Date: 06/23/2016  Today's Provider: Lelon Huh, MD   Chief Complaint  Patient presents with  . Cough   Subjective:    Cough and congestion started 6 days ago. Patient stated that she has had mild fever, body aches, headaches, nasal congestion and pressure. Also has mild sob and wheezing. Patient stated cough is non productive. Patient took otc Theraflu with mild relief. Symptoms have improved alightly.    Cough  This is a new problem. The current episode started in the past 7 days (6 days ago). The problem has been gradually improving. The problem occurs every few minutes. The cough is productive of sputum. Associated symptoms include chills, a fever, headaches, myalgias, nasal congestion, postnasal drip, rhinorrhea, shortness of breath and sweats. Pertinent negatives include no chest pain, ear congestion, ear pain, heartburn, hemoptysis, rash, sore throat, weight loss or wheezing. The symptoms are aggravated by lying down. Treatments tried: theraflu. The treatment provided mild relief. Her past medical history is significant for bronchitis. There is no history of pneumonia.   Was feeling better two days ago, but worsened yesterday with more wheezing and coughing.     No Known Allergies   Current Outpatient Prescriptions:  .  alendronate (FOSAMAX) 70 MG tablet, Take 1 tablet (70 mg total) by mouth once a week. Take with a full glass of water on an empty stomach., Disp: 12 tablet, Rfl: 4 .  ALPRAZolam (XANAX) 0.5 MG tablet, Take 1 tablet (0.5 mg total) by mouth 2 (two) times daily as needed., Disp: 180 tablet, Rfl: 0 .  Biotin (BIOTIN 5000) 5 MG CAPS, Take by mouth., Disp: , Rfl:  .  Biotin 5000 MCG CAPS, Take 5,000 mcg by mouth 3 (three) times daily., Disp: , Rfl:  .  Blood Glucose Monitoring Suppl (ACCU-CHEK AVIVA CONNECT) W/DEVICE KIT, ACCU-CHEK AVIVA (In Vitro Strip)  1 Strip Use to check blood  sugar daily as instructed for 0 days  Quantity: 50;  Refills: 2   Ordered :22-May-2014  Margarita Rana MD;  Started 22-May-2014 Active, Disp: , Rfl:  .  cetirizine (ZYRTEC) 10 MG tablet, Take 1 tablet by mouth daily., Disp: , Rfl:  .  fluticasone (FLONASE) 50 MCG/ACT nasal spray, Place 2 sprays into the nose daily. 2 sprays each nostril, Disp: , Rfl:  .  levothyroxine (SYNTHROID, LEVOTHROID) 50 MCG tablet, Take 1 tablet (50 mcg total) by mouth daily., Disp: 90 tablet, Rfl: 3 .  Melatonin 1 MG TABS, Take 1 mg by mouth at bedtime as needed., Disp: , Rfl:  .  omeprazole (PRILOSEC) 40 MG capsule, Take 1 capsule (40 mg total) by mouth daily., Disp: 90 capsule, Rfl: 3 .  ranitidine (ZANTAC) 150 MG tablet, Take 1 tablet by mouth daily. As needed, Disp: , Rfl:  .  escitalopram (LEXAPRO) 5 MG tablet, Take 1 tablet (5 mg total) by mouth daily. (Patient not taking: Reported on 06/23/2016), Disp: 30 tablet, Rfl: 5  Review of Systems  Constitutional: Positive for chills and fever. Negative for appetite change, fatigue and weight loss.  HENT: Positive for congestion, postnasal drip, rhinorrhea and sinus pressure. Negative for ear pain and sore throat.   Respiratory: Positive for cough and shortness of breath. Negative for hemoptysis, chest tightness and wheezing.   Cardiovascular: Negative for chest pain and palpitations.  Gastrointestinal: Negative for abdominal pain, heartburn, nausea and vomiting.  Musculoskeletal: Positive  for myalgias.  Skin: Negative for rash.  Neurological: Positive for headaches. Negative for dizziness and weakness.    Social History  Substance Use Topics  . Smoking status: Never Smoker  . Smokeless tobacco: Never Used  . Alcohol use No   Objective:   BP 100/70 (BP Location: Right Arm, Patient Position: Sitting, Cuff Size: Normal)   Pulse (!) 53   Temp 97.8 F (36.6 C) (Oral)   Resp 16   Wt 137 lb (62.1 kg)   SpO2 97%   BMI 22.80 kg/m   Physical Exam \  General  Appearance:    Alert, cooperative, no distress, appears well  HENT:   bilateral TM normal without fluid or infection, neck without nodes and nasal mucosa pale and congested  Eyes:    PERRL, conjunctiva/corneas clear, EOM's intact       Lungs:     Occasional expiratory wheeze, no rales, , respirations unlabored  Heart:    Regular rate and rhythm  Neurologic:   Awake, alert, oriented x 3. No apparent focal neurological           defect.          Assessment & Plan:      1. Bronchitis  - azithromycin (ZITHROMAX) 250 MG tablet; 2 by mouth today, then 1 daily for 4 days  Dispense: 6 tablet; Refill: 0  Call if symptoms change or if not rapidly improving.          Lelon Huh, MD  [facility]

## 2016-07-22 ENCOUNTER — Other Ambulatory Visit: Payer: Self-pay | Admitting: Family Medicine

## 2016-07-22 DIAGNOSIS — K219 Gastro-esophageal reflux disease without esophagitis: Secondary | ICD-10-CM

## 2016-07-22 MED ORDER — LEVOTHYROXINE SODIUM 50 MCG PO TABS: 50.0000 ug | ORAL_TABLET | Freq: Every day | ORAL | 4 refills | Status: DC

## 2016-07-22 MED ORDER — OMEPRAZOLE 40 MG PO CPDR: 40.0000 mg | DELAYED_RELEASE_CAPSULE | Freq: Every day | ORAL | 4 refills | Status: DC

## 2016-07-22 NOTE — Telephone Encounter (Signed)
Pt contacted office for refill request on the following medications:  90 day supply.  [facility] [address]  CB#[phone]/MW  omeprazole (PRILOSEC) 40 MG capsule  levothyroxine (SYNTHROID, LEVOTHROID) 50 MCG tablet

## 2016-07-22 NOTE — Telephone Encounter (Signed)
Patient advised refills have been sent to [facility]

## 2016-09-11 ENCOUNTER — Ambulatory Visit (INDEPENDENT_AMBULATORY_CARE_PROVIDER_SITE_OTHER): Payer: Medicare HMO | Admitting: Physician Assistant

## 2016-09-11 ENCOUNTER — Encounter: Payer: Self-pay | Admitting: Physician Assistant

## 2016-09-11 VITALS — BP 110/72 | HR 68 | Temp 97.8°F | Resp 16 | Wt 139.0 lb

## 2016-09-11 DIAGNOSIS — N952 Postmenopausal atrophic vaginitis: Secondary | ICD-10-CM

## 2016-09-11 DIAGNOSIS — R8281 Pyuria: Secondary | ICD-10-CM

## 2016-09-11 DIAGNOSIS — N8110 Cystocele, unspecified: Secondary | ICD-10-CM | POA: Diagnosis not present

## 2016-09-11 DIAGNOSIS — R1030 Lower abdominal pain, unspecified: Secondary | ICD-10-CM | POA: Diagnosis not present

## 2016-09-11 DIAGNOSIS — N390 Urinary tract infection, site not specified: Secondary | ICD-10-CM

## 2016-09-11 LAB — POCT URINALYSIS DIPSTICK
Bilirubin, UA: NEGATIVE
Glucose, UA: NEGATIVE
Ketones, UA: NEGATIVE
Nitrite, UA: NEGATIVE
Protein, UA: NEGATIVE
Spec Grav, UA: >=1.03 — AB (ref 1.010–1.025)
Urobilinogen, UA: 0.2 E.U./dL
pH, UA: 6 (ref 5.0–8.0)

## 2016-09-11 MED ORDER — ESTRADIOL 0.1 MG/GM VA CREA: 1.0000 | TOPICAL_CREAM | VAGINAL | 5 refills | Status: DC

## 2016-09-11 NOTE — Patient Instructions (Signed)
Atrophic Vaginitis Atrophic vaginitis is a condition in which the tissues that line the vagina become dry and thin. This condition is most common in women who have stopped having regular menstrual periods (menopause). This usually starts when a woman is 55-71 years old. Estrogen helps to keep the vagina moist. It stimulates the vagina to produce a clear fluid that lubricates the vagina for sexual intercourse. This fluid also protects the vagina from infection. Lack of estrogen can cause the lining of the vagina to get thinner and dryer. The vagina may also shrink in size. It may become less elastic. Atrophic vaginitis tends to get worse over time as a woman's estrogen level drops. What are the causes? This condition is caused by the normal drop in estrogen that happens around the time of menopause. What increases the risk? Certain conditions or situations may lower a woman's estrogen level, which increases her risk of atrophic vaginitis. These include:  Taking medicine that blocks estrogen.  Having ovaries removed surgically.  Being treated for cancer with X-ray treatment (radiation) or medicines (chemotherapy).  Exercising very hard and often.  Having an eating disorder (anorexia).  Giving birth or breastfeeding.  Being over the age of 47.  Smoking. What are the signs or symptoms? Symptoms of this condition include:  Pain, soreness, or bleeding during sexual intercourse (dyspareunia).  Vaginal burning, irritation, or itching.  Pain or bleeding during a vaginal examination using a speculum (pelvic exam).  Loss of interest in sexual activity.  Having burning pain when passing urine.  Vaginal discharge that is brown or yellow. In some cases, there are no symptoms. How is this diagnosed? This condition is diagnosed with a medical history and physical exam. This will include a pelvic exam that checks whether the inside of your vagina appears pale, thin, or dry. Rarely, you may also  have other tests, including:  A urine test.  A test that checks the acid balance in your vaginal fluid (acid balance test). How is this treated? Treatment for this condition may depend on the severity of your symptoms. Treatment may include:  Using an over-the-counter vaginal lubricant before you have sexual intercourse.  Using a long-acting vaginal moisturizer.  Using low-dose vaginal estrogen for moderate to severe symptoms that do not respond to other treatments. Options include creams, tablets, and inserts (vaginal rings). Before using vaginal estrogen, tell your health care provider if you have a history of:  Breast cancer.  Endometrial cancer.  Blood clots.  Taking medicines. You may be able to take a daily pill for dyspareunia. Discuss all of the risks of this medicine with your health care provider. It is usually not recommended for women who have a family history or personal history of breast cancer. If your symptoms are very mild and you are not sexually active, you may not need treatment. Follow these instructions at home:  Take medicines only as directed by your health care provider. Do not use herbal or alternative medicines unless your health care provider says that you can.  Use over-the-counter creams, lubricants, or moisturizers for dryness only as directed by your health care provider.  If your atrophic vaginitis is caused by menopause, discuss all of your menopausal symptoms and treatment options with your health care provider.  Do not douche.  Do not use products that can make your vagina dry. These include:  Scented feminine sprays.  Scented tampons.  Scented soaps.  If it hurts to have sex, talk with your sexual partner. Contact a health  care provider if:  Your discharge looks different than normal.  Your vagina has an unusual smell.  You have new symptoms.  Your symptoms do not improve with treatment.  Your symptoms get worse. This  information is not intended to replace advice given to you by your health care provider. Make sure you discuss any questions you have with your health care provider. Document Released: 09/18/2014 Document Revised: 10/10/2015 Document Reviewed: 04/25/2014 Elsevier Interactive Patient Education  2017 [employer]. Pelvic Organ Prolapse Pelvic organ prolapse is the stretching, bulging, or dropping of pelvic organs into an abnormal position. It happens when the muscles and tissues that surround and support pelvic structures are stretched or weak. Pelvic organ prolapse can involve:  Vagina (vaginal prolapse).  Uterus (uterine prolapse).  Bladder (cystocele).  Rectum (rectocele).  Intestines (enterocele). When organs other than the vagina are involved, they often bulge into the vagina or protrude from the vagina, depending on how severe the prolapse is. What are the causes? Causes of this condition include:  Pregnancy, labor, and childbirth.  Long-lasting (chronic) cough.  Chronic constipation.  Obesity.  Past pelvic surgery.  Aging. During and after menopause, a decreased production of the hormone estrogen can weaken pelvic ligaments and muscles.  Consistently lifting more than 50 lb (23 kg).  Buildup of fluid in the abdomen due to certain diseases and other conditions. What are the signs or symptoms? Symptoms of this condition include:  Loss of bladder control when you cough, sneeze, strain, and exercise (stress incontinence). This may be worse immediately following childbirth, and it may gradually improve over time.  Feeling pressure in your pelvis or vagina. This pressure may increase when you cough or when you are having a bowel movement.  A bulge that protrudes from the opening of your vagina or against your vaginal wall. If your uterus protrudes through the opening of your vagina and rubs against your clothing, you may also experience soreness, ulcers, infection, pain, and  bleeding.  Increased effort to have a bowel movement or urinate.  Pain in your low back.  Pain, discomfort, or disinterest in sexual intercourse.  Repeated bladder infections (urinary tract infections).  Difficulty inserting or inability to insert a tampon or applicator. In some people, this condition does not cause any symptoms. How is this diagnosed? Your health care provider may perform an internal and external vaginal and rectal exam. During the exam, you may be asked to cough and strain while you are lying down, sitting, and standing up. Your health care provider will determine if other tests are required, such as bladder function tests. How is this treated? In most cases, this condition needs to be treated only if it produces symptoms. No treatment is guaranteed to correct the prolapse or relieve the symptoms completely. Treatment may include:  Lifestyle changes, such as:  Avoiding drinking beverages that contain caffeine.  Increasing your intake of high-fiber foods. This can help to decrease constipation and straining during bowel movements.  Emptying your bladder at scheduled times (bladder training therapy). This can help to reduce or avoid urinary incontinence.  Losing weight if you are overweight or obese.  Estrogen. Estrogen may help mild prolapse by increasing the strength and tone of pelvic floor muscles.  Kegel exercises. These may help mild cases of prolapse by strengthening and tightening the muscles of the pelvic floor.  Pessary insertion. A pessary is a soft, flexible device that is placed into your vagina by your health care provider to help support the vaginal  walls and keep pelvic organs in place.  Surgery. This is often the only form of treatment for severe prolapse. Different types of surgeries are available. Follow these instructions at home:  Wear a sanitary pad or absorbent product if you have urinary incontinence.  Avoid heavy lifting and straining  with exercise and work. Do not hold your breath when you perform mild to moderate lifting and exercise activities. Limit your activities as directed by your health care provider.  Take medicines only as directed by your health care provider.  Perform Kegel exercises as directed by your health care provider.  If you have a pessary, take care of it as directed by your health care provider. Contact a health care provider if:  Your symptoms interfere with your daily activities or sex life.  You need medicine to help with the discomfort.  You notice bleeding from the vagina that is not related to your period.  You have a fever.  You have pain or bleeding when you urinate.  You have bleeding when you have a bowel movement.  You lose urine when you have sex.  You have chronic constipation.  You have a pessary that falls out.  You have vaginal discharge that has a bad smell.  You have low abdominal pain or cramping that is unusual for you. This information is not intended to replace advice given to you by your health care provider. Make sure you discuss any questions you have with your health care provider. Document Released: 11/29/2013 Document Revised: 10/10/2015 Document Reviewed: 07/17/2013 Elsevier Interactive Patient Education  2017 [employer].

## 2016-09-11 NOTE — Progress Notes (Addendum)
Patient: Sonya Barnes Female    DOB: 1945-11-30   71 y.o.   MRN: [id number] Visit Date: 09/11/2016  Today's Provider: Mar Daring, PA-C   Chief Complaint  Patient presents with  . Abdominal Pain   Subjective:    Abdominal Pain  This is a new problem. The current episode started 1 to 4 weeks ago (about 3 weeks). The onset quality is gradual. The problem occurs daily. The quality of the pain is dull. Associated symptoms include constipation and frequency. Pertinent negatives include no diarrhea, dysuria, fever, headaches, hematuria, nausea or vomiting. Associated symptoms comments: Has chronic constipation.   Patient reports that she has a dull ache in her lower abdominal area. Patient reports that this is constant. She denies any fever, nausea or vomiting, diarrhea or change in constipation (has chronic constipation), melena or hematochezia. She does report having increased urinary frequency. Personal history of left ovarian cyst that required surgery due to torsion.     No Known Allergies   Current Outpatient Prescriptions:  .  alendronate (FOSAMAX) 70 MG tablet, Take 1 tablet (70 mg total) by mouth once a week. Take with a full glass of water on an empty stomach., Disp: 12 tablet, Rfl: 4 .  ALPRAZolam (XANAX) 0.5 MG tablet, Take 1 tablet (0.5 mg total) by mouth 2 (two) times daily as needed., Disp: 180 tablet, Rfl: 0 .  Biotin (BIOTIN 5000) 5 MG CAPS, Take by mouth., Disp: , Rfl:  .  Biotin 5000 MCG CAPS, Take 5,000 mcg by mouth 3 (three) times daily., Disp: , Rfl:  .  Blood Glucose Monitoring Suppl (ACCU-CHEK AVIVA CONNECT) W/DEVICE KIT, ACCU-CHEK AVIVA (In Vitro Strip)  1 Strip Use to check blood sugar daily as instructed for 0 days  Quantity: 50;  Refills: 2   Ordered :22-May-2014  Margarita Rana MD;  Started 22-May-2014 Active, Disp: , Rfl:  .  cetirizine (ZYRTEC) 10 MG tablet, Take 1 tablet by mouth daily., Disp: , Rfl:  .  fluticasone (FLONASE) 50 MCG/ACT nasal spray,  Place 2 sprays into the nose daily. 2 sprays each nostril, Disp: , Rfl:  .  levothyroxine (SYNTHROID, LEVOTHROID) 50 MCG tablet, Take 1 tablet (50 mcg total) by mouth daily., Disp: 90 tablet, Rfl: 4 .  Melatonin 1 MG TABS, Take 1 mg by mouth at bedtime as needed., Disp: , Rfl:  .  omeprazole (PRILOSEC) 40 MG capsule, Take 1 capsule (40 mg total) by mouth daily., Disp: 90 capsule, Rfl: 4 .  ranitidine (ZANTAC) 150 MG tablet, Take 1 tablet by mouth daily. As needed, Disp: , Rfl:  .  escitalopram (LEXAPRO) 5 MG tablet, Take 1 tablet (5 mg total) by mouth daily. (Patient not taking: Reported on 06/23/2016), Disp: 30 tablet, Rfl: 5  Review of Systems  Constitutional: Negative for activity change, appetite change, chills, diaphoresis, fatigue, fever and unexpected weight change.  Respiratory: Negative for cough, chest tightness and shortness of breath.   Cardiovascular: Negative for chest pain, palpitations and leg swelling.  Gastrointestinal: Positive for abdominal pain and constipation. Negative for abdominal distention, anal bleeding, blood in stool, diarrhea, nausea and vomiting.  Genitourinary: Positive for frequency, pelvic pain (pressure not pain) and urgency. Negative for decreased urine volume, dysuria, enuresis, flank pain, genital sores, hematuria, menstrual problem, vaginal bleeding, vaginal discharge and vaginal pain.  Musculoskeletal: Negative for back pain.  Neurological: Negative for dizziness and headaches.    Social History  Substance Use Topics  . Smoking status: Never  Smoker  . Smokeless tobacco: Never Used  . Alcohol use No   Objective:   BP 110/72 (BP Location: Right Arm, Patient Position: Sitting, Cuff Size: Normal)   Pulse 68   Temp 97.8 F (36.6 C)   Resp 16   Wt 139 lb (63 kg)   SpO2 97%   BMI 23.13 kg/m  Vitals:   09/11/16 1100  BP: 110/72  Pulse: 68  Resp: 16  Temp: 97.8 F (36.6 C)  SpO2: 97%  Weight: 139 lb (63 kg)     Physical Exam  Constitutional:  She appears well-developed and well-nourished. No distress.  Neck: Normal range of motion. Neck supple. No JVD present. No tracheal deviation present. No thyromegaly present.  Cardiovascular: Normal rate, regular rhythm and normal heart sounds.  Exam reveals no gallop and no friction rub.   No murmur heard. Pulmonary/Chest: Effort normal and breath sounds normal. No respiratory distress. She has no wheezes. She has no rales.  Abdominal: Soft. Bowel sounds are normal. She exhibits no distension, no ascites, no pulsatile midline mass and no mass. There is no tenderness. There is no CVA tenderness.  Genitourinary: Uterus normal. There is rash (external erythema noted on labia majora and minora) on the right labia. There is no tenderness, lesion or injury on the right labia. There is rash (external erythema noted on labia majora and minora) on the left labia. There is no tenderness, lesion or injury on the left labia. Cervix exhibits no motion tenderness, no discharge and no friability. Right adnexum displays no mass, no tenderness and no fullness. Left adnexum displays no mass, no tenderness and no fullness. No erythema or tenderness in the vagina. No vaginal discharge found.  Lymphadenopathy:    She has no cervical adenopathy.       Right: No inguinal adenopathy present.       Left: No inguinal adenopathy present.  Skin: She is not diaphoretic.  Vitals reviewed.       Assessment & Plan:     1. Lower abdominal pain UA had trace leukocytes. Will send for culture and f/u pending culture. Being that patient was asymptomatic will not treat until C&S results have returned.  - POCT urinalysis dipstick  2. Vaginal atrophy Suspect the erythema at vaginal opening to be secondary to postmenopausal estrogen deficiency causing vaginal atrophy. Will give estrace cream as below for her to use three times per week to once weekly pending symptom relief.  - estradiol (ESTRACE) 0.1 MG/GM vaginal cream; Place 1  Applicatorful vaginally once a week.  Dispense: 42.5 g; Refill: 5  3. Pyuria See above medical treatment plan. - Urine Culture  4. Female cystocele Small cystocele noted. Bladder not at vaginal opening yet. Discussed Kegel exercises and timed toileting. Continue management of constipation to reduce straining. She is to call if urine frequency worsens, starts developing UTIs, or develops vaginal pressure or pain.        Mar Daring, PA-C  [facility]

## 2016-09-13 LAB — URINE CULTURE: Organism ID, Bacteria: NO GROWTH

## 2016-09-14 ENCOUNTER — Telehealth: Payer: Self-pay

## 2016-09-14 NOTE — Telephone Encounter (Signed)
Pt is returning call.  CB#[phone]/MW

## 2016-09-14 NOTE — Telephone Encounter (Signed)
-----   Message from Margaretann Loveless, [place] sent at 09/14/2016  8:44 AM EDT ----- Urine culture was negative, no growth. Suspect vaginal atrophy as we discussed in office and you are being treated for. Please call if no improvements.

## 2016-09-14 NOTE — Telephone Encounter (Signed)
lmtcb

## 2016-10-22 DIAGNOSIS — H524 Presbyopia: Secondary | ICD-10-CM | POA: Diagnosis not present

## 2016-12-08 DIAGNOSIS — R69 Illness, unspecified: Secondary | ICD-10-CM | POA: Diagnosis not present

## 2016-12-30 ENCOUNTER — Encounter: Payer: Medicare HMO | Admitting: Family Medicine

## 2017-01-05 ENCOUNTER — Ambulatory Visit (INDEPENDENT_AMBULATORY_CARE_PROVIDER_SITE_OTHER): Payer: Medicare HMO

## 2017-01-05 VITALS — BP 114/80 | HR 56 | Temp 98.5°F | Ht 65.0 in | Wt 137.6 lb

## 2017-01-05 DIAGNOSIS — Z0000 Encounter for general adult medical examination without abnormal findings: Secondary | ICD-10-CM

## 2017-01-05 NOTE — Progress Notes (Signed)
Subjective:   Sonya Barnes is a 71 y.o. female who presents for Medicare Annual (Subsequent) preventive examination.  Review of Systems:  N/A  Cardiac Risk Factors include: advanced age (>28mn, >>61women);dyslipidemia     Objective:     Vitals: BP 114/80 (BP Location: Left Arm)   Pulse (!) 56   Temp 98.5 F (36.9 C) (Oral)   Ht 5' 5"  (1.651 m)   Wt 137 lb 9.6 oz (62.4 kg)   BMI 22.90 kg/m   Body mass index is 22.9 kg/m.   Tobacco History  Smoking Status  . Never Smoker  Smokeless Tobacco  . Never Used     Counseling given: Not Answered   Past Medical History:  Diagnosis Date  . Allergy   . Anxiety   . Bundle branch block, right   . Tendonitis of elbow, right    Past Surgical History:  Procedure Laterality Date  . BREAST BIOPSY Left    Core BX Negtive  . BREAST CYST ASPIRATION Right 15 years ago   Negative  . BREAST SURGERY Left    biopsy  . HAMMER TOE SURGERY Bilateral   . OVARIAN CYST REMOVAL  1967  . TONSILLECTOMY    . TOTAL THYROIDECTOMY Right 2007   Family History  Problem Relation Age of Onset  . Arthritis Mother   . Cancer Mother        lung  . Lung disease Mother   . Osteoporosis Mother   . Diabetes Father   . Stroke Father   . Heart disease Father   . Heart disease Sister   . Lung disease Sister   . Osteoporosis Sister   . Colon polyps Sister   . Early death Brother   . Mental illness Brother   . Liver disease Brother   . Breast cancer Neg Hx    History  Sexual Activity  . Sexual activity: Not on file    Outpatient Encounter Prescriptions as of 01/05/2017  Medication Sig  . alendronate (FOSAMAX) 70 MG tablet Take 1 tablet (70 mg total) by mouth once a week. Take with a full glass of water on an empty stomach.  . ALPRAZolam (XANAX) 0.5 MG tablet Take 1 tablet (0.5 mg total) by mouth 2 (two) times daily as needed.  . cetirizine (ZYRTEC) 10 MG tablet Take 1 tablet by mouth daily.  . fluticasone (FLONASE) 50 MCG/ACT nasal spray  Place 2 sprays into the nose daily. 2 sprays each nostril  . levothyroxine (SYNTHROID, LEVOTHROID) 50 MCG tablet Take 1 tablet (50 mcg total) by mouth daily.  . Melatonin 1 MG TABS Take 6 mg by mouth at bedtime as needed.   .Marland Kitchenomeprazole (PRILOSEC) 40 MG capsule Take 1 capsule (40 mg total) by mouth daily.  . ranitidine (ZANTAC) 150 MG tablet Take 1 tablet by mouth daily. As needed  . [DISCONTINUED] Blood Glucose Monitoring Suppl (ACCU-CHEK AVIVA CONNECT) W/DEVICE KIT ACCU-CHEK AVIVA (In Vitro Strip)  1 Strip Use to check blood sugar daily as instructed for 0 days  Quantity: 50;  Refills: 2   Ordered :22-May-2014  MMargarita RanaMD;  Started 22-May-2014 Active  . [DISCONTINUED] Biotin (BIOTIN 5000) 5 MG CAPS Take by mouth.  . [DISCONTINUED] Biotin 5000 MCG CAPS Take 5,000 mcg by mouth 3 (three) times daily.  . [DISCONTINUED] estradiol (ESTRACE) 0.1 MG/GM vaginal cream Place 1 Applicatorful vaginally once a week.   No facility-administered encounter medications on file as of 01/05/2017.     Activities of  Daily Living In your present state of health, do you have any difficulty performing the following activities: 01/05/2017  Hearing? Y  Vision? N  Difficulty concentrating or making decisions? Y  Walking or climbing stairs? N  Dressing or bathing? N  Doing errands, shopping? N  Preparing Food and eating ? N  Using the Toilet? N  In the past six months, have you accidently leaked urine? N  Do you have problems with loss of bowel control? N  Managing your Medications? N  Managing your Finances? N  Housekeeping or managing your Housekeeping? N  Some recent data might be hidden    Patient Care Team: Virginia Crews, MD as PCP - General (Family Medicine)    Assessment:     Exercise Activities and Dietary recommendations Current Exercise Habits: Home exercise routine, Type of exercise: treadmill;walking, Time (Minutes): 35 (to 40 mins), Frequency (Times/Week): 3, Weekly Exercise  (Minutes/Week): 105, Intensity: Moderate, Exercise limited by: None identified  Goals    . Exercise 150 minutes per week (moderate activity)    . Increase water intake          Recommend increasing water intake to 6-8 glasses of water a day.       Fall Risk Fall Risk  01/05/2017 10/23/2015 10/25/2014 10/08/2014  Falls in the past year? No No No No   Depression Screen PHQ 2/9 Scores 01/05/2017 10/23/2015 10/23/2015 10/25/2014  PHQ - 2 Score 2 2 2  0  PHQ- 9 Score 8 11 - -     Cognitive Function- Declined screening today.        Immunization History  Administered Date(s) Administered  . DTaP 05/18/1996  . Influenza, High Dose Seasonal PF 02/27/2016  . Influenza-Unspecified 01/17/2015  . Pneumococcal Conjugate-13 01/26/2014  . Pneumococcal Polysaccharide-23 02/15/2015  . Zoster 12/24/2010   Screening Tests Health Maintenance  Topic Date Due  . INFLUENZA VACCINE  12/16/2016  . TETANUS/TDAP  05/18/2026 (Originally 10/13/1964)  . MAMMOGRAM  12/01/2017  . COLONOSCOPY  03/25/2022  . DEXA SCAN  Completed  . Hepatitis C Screening  Completed  . PNA vac Low Risk Adult  Completed      Plan:  I have personally reviewed and addressed the Medicare Annual Wellness questionnaire and have noted the following in the patient's chart:  A. Medical and social history B. Use of alcohol, tobacco or illicit drugs  C. Current medications and supplements D. Functional ability and status E.  Nutritional status F.  Physical activity G. Advance directives H. List of other physicians I.  Hospitalizations, surgeries, and ER visits in previous 12 months J.  [place] such as hearing and vision if needed, cognitive and depression L. Referrals and appointments - none  In addition, I have reviewed and discussed with patient certain preventive protocols, quality metrics, and best practice recommendations. A written personalized care plan for preventive services as well as general preventive health  recommendations were provided to patient.  See attached scanned questionnaire for additional information.   Signed,  Fabio Neighbors, LPN Nurse Health Advisor   MD Recommendations: None, pt declined tetanus vaccine today.

## 2017-01-05 NOTE — Patient Instructions (Addendum)
Sonya Barnes , Thank you for taking time to come for your Medicare Wellness Visit. I appreciate your ongoing commitment to your health goals. Please review the following plan we discussed and let me know if I can assist you in the future.   Screening recommendations/referrals: Colonoscopy:  due 03/25/17 Mammogram: completed 12/02/15 Bone Density: up to date Recommended yearly ophthalmology/optometry visit for glaucoma screening and checkup Recommended yearly dental visit for hygiene and checkup  Vaccinations: Influenza vaccine: due fall 2018 Pneumococcal vaccine: completed series Tdap vaccine: declined Shingles vaccine: completed 12/24/10  Advanced directives: Please bring a copy of your POA (Power of [place]) and/or Living Will to your next appointment.   Conditions/risks identified: Recommend increasing water intake to 6-8 glasses of water a day.   Next appointment: 01/06/17 @ 10:00 AM   Preventive Care 65 Years and Older, Female Preventive care refers to lifestyle choices and visits with your health care provider that can promote health and wellness. What does preventive care include?  A yearly physical exam. This is also called an annual well check.  Dental exams once or twice a year.  Routine eye exams. Ask your health care provider how often you should have your eyes checked.  Personal lifestyle choices, including:  Daily care of your teeth and gums.  Regular physical activity.  Eating a healthy diet.  Avoiding tobacco and drug use.  Limiting alcohol use.  Practicing safe sex.  Taking low-dose aspirin every day.  Taking vitamin and mineral supplements as recommended by your health care provider. What happens during an annual well check? The services and screenings done by your health care provider during your annual well check will depend on your age, overall health, lifestyle risk factors, and family history of disease. Counseling  Your health care provider  may ask you questions about your:  Alcohol use.  Tobacco use.  Drug use.  Emotional well-being.  Home and relationship well-being.  Sexual activity.  Eating habits.  History of falls.  Memory and ability to understand (cognition).  Work and work Statistician.  Reproductive health. Screening  You may have the following tests or measurements:  Height, weight, and BMI.  Blood pressure.  Lipid and cholesterol levels. These may be checked every 5 years, or more frequently if you are over 46 years old.  Skin check.  Lung cancer screening. You may have this screening every year starting at age 37 if you have a 30-pack-year history of smoking and currently smoke or have quit within the past 15 years.  Fecal occult blood test (FOBT) of the stool. You may have this test every year starting at age 91.  Flexible sigmoidoscopy or colonoscopy. You may have a sigmoidoscopy every 5 years or a colonoscopy every 10 years starting at age 21.  Hepatitis C blood test.  Hepatitis B blood test.  Sexually transmitted disease (STD) testing.  Diabetes screening. This is done by checking your blood sugar (glucose) after you have not eaten for a while (fasting). You may have this done every 1-3 years.  Bone density scan. This is done to screen for osteoporosis. You may have this done starting at age 24.  Mammogram. This may be done every 1-2 years. Talk to your health care provider about how often you should have regular mammograms. Talk with your health care provider about your test results, treatment options, and if necessary, the need for more tests. Vaccines  Your health care provider may recommend certain vaccines, such as:  Influenza vaccine. This is  recommended every year.  Tetanus, diphtheria, and acellular pertussis (Tdap, Td) vaccine. You may need a Td booster every 10 years.  Zoster vaccine. You may need this after age 74.  Pneumococcal 13-valent conjugate (PCV13) vaccine.  One dose is recommended after age 75.  Pneumococcal polysaccharide (PPSV23) vaccine. One dose is recommended after age 29. Talk to your health care provider about which screenings and vaccines you need and how often you need them. This information is not intended to replace advice given to you by your health care provider. Make sure you discuss any questions you have with your health care provider. Document Released: 05/31/2015 Document Revised: 01/22/2016 Document Reviewed: 03/05/2015 Elsevier Interactive Patient Education  2017 [place] Prevention in the Home Falls can cause injuries. They can happen to people of all ages. There are many things you can do to make your home safe and to help prevent falls. What can I do on the outside of my home?  Regularly fix the edges of walkways and driveways and fix any cracks.  Remove anything that might make you trip as you walk through a door, such as a raised step or threshold.  Trim any bushes or trees on the path to your home.  Use bright outdoor lighting.  Clear any walking paths of anything that might make someone trip, such as rocks or tools.  Regularly check to see if handrails are loose or broken. Make sure that both sides of any steps have handrails.  Any raised decks and porches should have guardrails on the edges.  Have any leaves, snow, or ice cleared regularly.  Use sand or salt on walking paths during winter.  Clean up any spills in your garage right away. This includes oil or grease spills. What can I do in the bathroom?  Use night lights.  Install grab bars by the toilet and in the tub and shower. Do not use towel bars as grab bars.  Use non-skid mats or decals in the tub or shower.  If you need to sit down in the shower, use a plastic, non-slip stool.  Keep the floor dry. Clean up any water that spills on the floor as soon as it happens.  Remove soap buildup in the tub or shower regularly.  Attach bath  mats securely with double-sided non-slip rug tape.  Do not have throw rugs and other things on the floor that can make you trip. What can I do in the bedroom?  Use night lights.  Make sure that you have a light by your bed that is easy to reach.  Do not use any sheets or blankets that are too big for your bed. They should not hang down onto the floor.  Have a firm chair that has side arms. You can use this for support while you get dressed.  Do not have throw rugs and other things on the floor that can make you trip. What can I do in the kitchen?  Clean up any spills right away.  Avoid walking on wet floors.  Keep items that you use a lot in easy-to-reach places.  If you need to reach something above you, use a strong step stool that has a grab bar.  Keep electrical cords out of the way.  Do not use floor polish or wax that makes floors slippery. If you must use wax, use non-skid floor wax.  Do not have throw rugs and other things on the floor that can make you trip.  What can I do with my stairs?  Do not leave any items on the stairs.  Make sure that there are handrails on both sides of the stairs and use them. Fix handrails that are broken or loose. Make sure that handrails are as long as the stairways.  Check any carpeting to make sure that it is firmly attached to the stairs. Fix any carpet that is loose or worn.  Avoid having throw rugs at the top or bottom of the stairs. If you do have throw rugs, attach them to the floor with carpet tape.  Make sure that you have a light switch at the top of the stairs and the bottom of the stairs. If you do not have them, ask someone to add them for you. What else can I do to help prevent falls?  Wear shoes that:  Do not have high heels.  Have rubber bottoms.  Are comfortable and fit you well.  Are closed at the toe. Do not wear sandals.  If you use a stepladder:  Make sure that it is fully opened. Do not climb a closed  stepladder.  Make sure that both sides of the stepladder are locked into place.  Ask someone to hold it for you, if possible.  Clearly mark and make sure that you can see:  Any grab bars or handrails.  First and last steps.  Where the edge of each step is.  Use tools that help you move around (mobility aids) if they are needed. These include:  Canes.  Walkers.  Scooters.  Crutches.  Turn on the lights when you go into a dark area. Replace any light bulbs as soon as they burn out.  Set up your furniture so you have a clear path. Avoid moving your furniture around.  If any of your floors are uneven, fix them.  If there are any pets around you, be aware of where they are.  Review your medicines with your doctor. Some medicines can make you feel dizzy. This can increase your chance of falling. Ask your doctor what other things that you can do to help prevent falls. This information is not intended to replace advice given to you by your health care provider. Make sure you discuss any questions you have with your health care provider. Document Released: 02/28/2009 Document Revised: 10/10/2015 Document Reviewed: 06/08/2014 Elsevier Interactive Patient Education  2017 [employer].

## 2017-01-06 ENCOUNTER — Ambulatory Visit (INDEPENDENT_AMBULATORY_CARE_PROVIDER_SITE_OTHER): Payer: Medicare HMO | Admitting: Family Medicine

## 2017-01-06 ENCOUNTER — Encounter: Payer: Self-pay | Admitting: Family Medicine

## 2017-01-06 VITALS — BP 102/62 | HR 56 | Temp 97.8°F | Resp 16 | Ht 65.0 in | Wt 136.0 lb

## 2017-01-06 DIAGNOSIS — R739 Hyperglycemia, unspecified: Secondary | ICD-10-CM

## 2017-01-06 DIAGNOSIS — Z0000 Encounter for general adult medical examination without abnormal findings: Secondary | ICD-10-CM | POA: Diagnosis not present

## 2017-01-06 DIAGNOSIS — Z1231 Encounter for screening mammogram for malignant neoplasm of breast: Secondary | ICD-10-CM

## 2017-01-06 DIAGNOSIS — E7800 Pure hypercholesterolemia, unspecified: Secondary | ICD-10-CM | POA: Diagnosis not present

## 2017-01-06 DIAGNOSIS — E890 Postprocedural hypothyroidism: Secondary | ICD-10-CM | POA: Diagnosis not present

## 2017-01-06 DIAGNOSIS — Z1211 Encounter for screening for malignant neoplasm of colon: Secondary | ICD-10-CM | POA: Diagnosis not present

## 2017-01-06 NOTE — Assessment & Plan Note (Signed)
Continue Synthroid at current dose Patient asymptomatic and well controlled Recheck TSH status as indicated by results

## 2017-01-06 NOTE — Assessment & Plan Note (Signed)
Recheck lipid panel and CMP Not currently taking any medication

## 2017-01-06 NOTE — Patient Instructions (Signed)

## 2017-01-06 NOTE — Progress Notes (Signed)
Patient: Sonya Barnes, Female    DOB: 1945/09/06, 71 y.o.   MRN: [id number] Visit Date: 01/06/2017  Today's Provider: Shirlee Latch, MD   Chief Complaint  Patient presents with  . Annual Exam   Subjective:    Annual physical exam Sonya Barnes is a 71 y.o. female who presents today for health maintenance and complete physical. She feels well. She reports exercising 3 times per week. She walks for 35-40 minutes and trying to incorporate weight lifting. She reports she is sleeping fairly well.  She had her AWV with the nurse health advisor yesterday. Last BMD- 12/02/2015- osteoporosis. Is currently on Fosamax. Last mammogram- 12/02/2015- BI-RADS 1 Last colonoscopy- 03/25/2012- Internal hemorrhoids. Multiple diverticula. Repeat 11/18 per Dr. Mechele Collin.  -----------------------------------------------------------------   Review of Systems  Constitutional: Positive for appetite change and fatigue. Negative for activity change, chills, diaphoresis, fever and unexpected weight change.  HENT: Positive for rhinorrhea, tinnitus and trouble swallowing (chronic). Negative for congestion, dental problem, drooling, ear discharge, ear pain, facial swelling, hearing loss, mouth sores, nosebleeds, postnasal drip, sinus pain, sinus pressure, sneezing, sore throat and voice change.   Eyes: Negative.   Respiratory: Negative.   Cardiovascular: Negative.   Gastrointestinal: Negative.   Endocrine: Positive for polyphagia and polyuria (bladder prolapse). Negative for cold intolerance, heat intolerance and polydipsia.  Genitourinary: Negative.   Musculoskeletal: Negative.   Skin: Negative.   Allergic/Immunologic: Negative.   Neurological: Negative.   Hematological: Negative.   Psychiatric/Behavioral: Negative.     Social History      She  reports that she has never smoked. She has never used smokeless tobacco. She reports that she does not drink alcohol or use drugs.       Social History    Social History  . Marital status: Married    Spouse name: Kathlene November  . Number of children: 2  . Years of education: associates   Occupational History  . retired Sports coach   Social History Main Topics  . Smoking status: Never Smoker  . Smokeless tobacco: Never Used  . Alcohol use No  . Drug use: No  . Sexual activity: Yes   Other Topics Concern  . None   Social History Narrative  . None    Past Medical History:  Diagnosis Date  . Allergy   . Anxiety   . Bundle branch block, right   . Tendonitis of elbow, right      Patient Active Problem List   Diagnosis Date Noted  . Hypothyroidism associated with surgical procedure 01/07/2016  . Acid reflux 06/10/2015  . Abnormal ECG 09/07/2014  . Allergic rhinitis 09/07/2014  . Anxiety 09/07/2014  . Arthritis 09/07/2014  . Clinical depression 09/07/2014  . Blood glucose elevated 09/07/2014  . Cannot sleep 09/07/2014  . Osteoporosis 10/16/2008  . Big thyroid 10/16/2008  . Hypercholesteremia 10/16/2008    Past Surgical History:  Procedure Laterality Date  . BREAST BIOPSY Left    Core BX Negtive  . BREAST CYST ASPIRATION Right 15 years ago   Negative  . BREAST SURGERY Left    biopsy  . DILATION AND CURETTAGE OF UTERUS  1956-57  . HAMMER TOE SURGERY Bilateral   . OVARIAN CYST REMOVAL  1967  . THYROIDECTOMY, PARTIAL    . TONSILLECTOMY      Family History        Family Status  Relation Status  . Mother Deceased  . Father Deceased  . Sister Alive  . Brother Deceased  .  Brother Alive  . Neg Hx (Not Specified)        Her family history includes Arthritis in her mother; Cancer in her mother; Colon polyps in her sister; Diabetes in her father; Early death in her brother; Heart disease in her father and sister; Liver disease in her brother; Lung disease in her mother and sister; Mental illness in her brother; Osteoporosis in her mother and sister; Stroke in her father.     No Known Allergies   Current Outpatient  Prescriptions:  .  alendronate (FOSAMAX) 70 MG tablet, Take 1 tablet (70 mg total) by mouth once a week. Take with a full glass of water on an empty stomach., Disp: 12 tablet, Rfl: 4 .  ALPRAZolam (XANAX) 0.5 MG tablet, Take 1 tablet (0.5 mg total) by mouth 2 (two) times daily as needed., Disp: 180 tablet, Rfl: 0 .  cetirizine (ZYRTEC) 10 MG tablet, Take 1 tablet by mouth daily., Disp: , Rfl:  .  fluticasone (FLONASE) 50 MCG/ACT nasal spray, Place 2 sprays into the nose daily. 2 sprays each nostril, Disp: , Rfl:  .  levothyroxine (SYNTHROID, LEVOTHROID) 50 MCG tablet, Take 1 tablet (50 mcg total) by mouth daily., Disp: 90 tablet, Rfl: 4 .  Melatonin 1 MG TABS, Take 6 mg by mouth at bedtime as needed. , Disp: , Rfl:  .  omeprazole (PRILOSEC) 40 MG capsule, Take 1 capsule (40 mg total) by mouth daily., Disp: 90 capsule, Rfl: 4 .  Polyethyl Glycol-Propyl Glycol (SYSTANE ULTRA OP), Apply to eye., Disp: , Rfl:  .  ranitidine (ZANTAC) 150 MG tablet, Take 1 tablet by mouth daily. As needed, Disp: , Rfl:    Patient Care Team: Erasmo Downer, MD as PCP - General (Family Medicine)      Objective:   Vitals: BP 102/62 (BP Location: Left Arm, Patient Position: Sitting, Cuff Size: Normal)   Pulse (!) 56   Temp 97.8 F (36.6 C) (Oral)   Resp 16   Ht 5\' 5"  (1.651 m)   Wt 136 lb (61.7 kg)   BMI 22.63 kg/m    Vitals:   01/06/17 1006  BP: 102/62  Pulse: (!) 56  Resp: 16  Temp: 97.8 F (36.6 C)  TempSrc: Oral  Weight: 136 lb (61.7 kg)  Height: 5\' 5"  (1.651 m)     Physical Exam  Constitutional: She is oriented to person, place, and time. She appears well-developed and well-nourished. No distress.  HENT:  Head: Normocephalic and atraumatic.  Right Ear: External ear normal.  Left Ear: External ear normal.  Nose: Nose normal.  Mouth/Throat: Oropharynx is clear and moist.  Eyes: Pupils are equal, round, and reactive to light. Conjunctivae are normal. No scleral icterus.  Neck: Neck  supple.  Cardiovascular: Normal rate, regular rhythm, normal heart sounds and intact distal pulses.   No murmur heard. Pulmonary/Chest: Effort normal and breath sounds normal. No respiratory distress. She has no wheezes. She has no rales.  Breasts: breasts appear normal, no suspicious masses, no skin or nipple changes or axillary nodes.  Abdominal: Soft. Bowel sounds are normal. She exhibits no distension. There is no tenderness. There is no rebound and no guarding.  Musculoskeletal: She exhibits no edema or deformity.  Lymphadenopathy:    She has no cervical adenopathy.  Neurological: She is alert and oriented to person, place, and time.  Skin: Skin is warm and dry. No rash noted.  Psychiatric: She has a normal mood and affect. Her behavior is normal.  Vitals  reviewed.    Depression Screen PHQ 2/9 Scores 01/05/2017 10/23/2015 10/23/2015 10/25/2014  PHQ - 2 Score 2 2 2  0  PHQ- 9 Score 8 11 - -      Assessment & Plan:     Routine Health Maintenance and Physical Exam  Exercise Activities and Dietary recommendations Goals    . Exercise 150 minutes per week (moderate activity)    . Increase water intake          Recommend increasing water intake to 6-8 glasses of water a day.        Immunization History  Administered Date(s) Administered  . DTaP 05/18/1996  . Influenza, High Dose Seasonal PF 02/27/2016  . Influenza-Unspecified 01/17/2015  . Pneumococcal Conjugate-13 01/26/2014  . Pneumococcal Polysaccharide-23 02/15/2015  . Zoster 12/24/2010    Health Maintenance  Topic Date Due  . INFLUENZA VACCINE  12/16/2016  . TETANUS/TDAP  05/18/2026 (Originally 10/13/1964)  . MAMMOGRAM  12/01/2017  . COLONOSCOPY  03/25/2022  . DEXA SCAN  Completed  . Hepatitis C Screening  Completed  . PNA vac Low Risk Adult  Completed     Discussed health benefits of physical activity, and encouraged her to engage in regular exercise appropriate for her age and condition.  Reviewed records  from AWV yesterday.   Healthcare maintenance Patient declined tetanus vaccination Screening mammogram and colonoscopy orders placed today Next CPE in one year  Hypercholesteremia Recheck lipid panel and CMP Not currently taking any medication  Blood glucose elevated Recheck A1c  Hypothyroidism associated with surgical procedure Continue Synthroid at current dose Patient asymptomatic and well controlled Recheck TSH status as indicated by results    --------------------------------------------------------------------  The entirety of the information documented in the History of Present Illness, Review of Systems and Physical Exam were personally obtained by me. Portions of this information were initially documented by Irving Burton Ratchford, CMA and reviewed by me for thoroughness and accuracy.    Shirlee Latch, MD  [facility]

## 2017-01-06 NOTE — Assessment & Plan Note (Signed)
Patient declined tetanus vaccination Screening mammogram and colonoscopy orders placed today Next CPE in one year

## 2017-01-06 NOTE — Assessment & Plan Note (Signed)
Recheck A1c 

## 2017-01-08 ENCOUNTER — Other Ambulatory Visit: Payer: Self-pay

## 2017-01-08 ENCOUNTER — Telehealth: Payer: Self-pay

## 2017-01-08 DIAGNOSIS — Z86010 Personal history of colonic polyps: Secondary | ICD-10-CM

## 2017-01-08 NOTE — Telephone Encounter (Signed)
Gastroenterology Pre-Procedure Review  Request Date: 9/7 Requesting Physician: Dr. Allen Norris  PATIENT REVIEW QUESTIONS: The patient responded to the following health history questions as indicated:    1. Are you having any GI issues? no 2. Do you have a personal history of Polyps? yes (2 polyps removed 03/2012) 3. Do you have a family history of Colon Cancer or Polyps? yes (sister: polyps) 4. Diabetes Mellitus? no 5. Joint replacements in the past 12 months?no 6. Major health problems in the past 3 months?no 7. Any artificial heart valves, MVP, or defibrillator?no    MEDICATIONS & ALLERGIES:    Patient reports the following regarding taking any anticoagulation/antiplatelet therapy:   Plavix, Coumadin, Eliquis, Xarelto, Lovenox, Pradaxa, Brilinta, or Effient? no Aspirin? no  Patient confirms/reports the following medications:  Current Outpatient Prescriptions  Medication Sig Dispense Refill  . alendronate (FOSAMAX) 70 MG tablet Take 1 tablet (70 mg total) by mouth once a week. Take with a full glass of water on an empty stomach. 12 tablet 4  . ALPRAZolam (XANAX) 0.5 MG tablet Take 1 tablet (0.5 mg total) by mouth 2 (two) times daily as needed. 180 tablet 0  . cetirizine (ZYRTEC) 10 MG tablet Take 1 tablet by mouth daily.    . fluticasone (FLONASE) 50 MCG/ACT nasal spray Place 2 sprays into the nose daily. 2 sprays each nostril    . levothyroxine (SYNTHROID, LEVOTHROID) 50 MCG tablet Take 1 tablet (50 mcg total) by mouth daily. 90 tablet 4  . Melatonin 1 MG TABS Take 6 mg by mouth at bedtime as needed.     Marland Kitchen omeprazole (PRILOSEC) 40 MG capsule Take 1 capsule (40 mg total) by mouth daily. 90 capsule 4  . Polyethyl Glycol-Propyl Glycol (SYSTANE ULTRA OP) Apply to eye.    . ranitidine (ZANTAC) 150 MG tablet Take 1 tablet by mouth daily. As needed     No current facility-administered medications for this visit.     Patient confirms/reports the following allergies:  No Known Allergies  No  orders of the defined types were placed in this encounter.   AUTHORIZATION INFORMATION Primary Insurance: 1D#: Group #:  Secondary Insurance: 1D#: Group #:  SCHEDULE INFORMATION: Date: 9/7 Time: Location: [place]

## 2017-01-11 ENCOUNTER — Other Ambulatory Visit
Admission: RE | Admit: 2017-01-11 | Discharge: 2017-01-11 | Disposition: A | Discharge status: Home / Self Care | Payer: Medicare HMO | Source: Ambulatory Visit | Attending: Family Medicine | Admitting: Family Medicine

## 2017-01-11 ENCOUNTER — Telehealth: Payer: Self-pay | Admitting: Gastroenterology

## 2017-01-11 DIAGNOSIS — E890 Postprocedural hypothyroidism: Secondary | ICD-10-CM | POA: Diagnosis not present

## 2017-01-11 DIAGNOSIS — R739 Hyperglycemia, unspecified: Secondary | ICD-10-CM | POA: Diagnosis not present

## 2017-01-11 DIAGNOSIS — Z0000 Encounter for general adult medical examination without abnormal findings: Secondary | ICD-10-CM | POA: Diagnosis not present

## 2017-01-11 DIAGNOSIS — E7800 Pure hypercholesterolemia, unspecified: Secondary | ICD-10-CM | POA: Diagnosis not present

## 2017-01-11 LAB — COMPREHENSIVE METABOLIC PANEL
ALT: 15 U/L (ref 14–54)
AST: 24 U/L (ref 15–41)
Albumin: 4.1 g/dL (ref 3.5–5.0)
Alkaline Phosphatase: 69 U/L (ref 38–126)
Anion gap: 7 (ref 5–15)
BUN: 19 mg/dL (ref 6–20)
CO2: 29 mmol/L (ref 22–32)
Calcium: 9.6 mg/dL (ref 8.9–10.3)
Chloride: 104 mmol/L (ref 101–111)
Creatinine, Ser: 0.82 mg/dL (ref 0.44–1.00)
GFR calc Af Amer: >60 mL/min (ref >60)
GFR calc non Af Amer: >60 mL/min (ref >60)
Glucose, Bld: 73 mg/dL (ref 65–99)
Potassium: 4.5 mmol/L (ref 3.5–5.1)
Sodium: 140 mmol/L (ref 135–145)
Total Bilirubin: 0.6 mg/dL (ref 0.3–1.2)
Total Protein: 6.7 g/dL (ref 6.5–8.1)

## 2017-01-11 LAB — TSH: TSH: 2.665 u[IU]/mL (ref 0.350–4.500)

## 2017-01-11 LAB — CBC WITH DIFFERENTIAL/PLATELET
Basophils Absolute: 0.1 10*3/uL (ref 0–0.1)
Basophils Relative: 1 %
Eosinophils Absolute: 0.1 10*3/uL (ref 0–0.7)
Eosinophils Relative: 2 %
HCT: 39.8 % (ref 35.0–47.0)
Hemoglobin: 13.3 g/dL (ref 12.0–16.0)
Lymphocytes Relative: 22 %
Lymphs Abs: 1.1 10*3/uL (ref 1.0–3.6)
MCH: 30.8 pg (ref 26.0–34.0)
MCHC: 33.5 g/dL (ref 32.0–36.0)
MCV: 91.7 fL (ref 80.0–100.0)
Monocytes Absolute: 0.6 10*3/uL (ref 0.2–0.9)
Monocytes Relative: 12 %
Neutro Abs: 3 10*3/uL (ref 1.4–6.5)
Neutrophils Relative %: 63 %
Platelets: 273 10*3/uL (ref 150–440)
RBC: 4.34 MIL/uL (ref 3.80–5.20)
RDW: 13.4 % (ref 11.5–14.5)
WBC: 4.8 10*3/uL (ref 3.6–11.0)

## 2017-01-11 LAB — LIPID PANEL
Cholesterol: 208 mg/dL — ABNORMAL HIGH (ref 0–200)
HDL: 54 mg/dL (ref >40)
LDL Cholesterol: 134 mg/dL — ABNORMAL HIGH (ref 0–99)
Total CHOL/HDL Ratio: 3.9 RATIO
Triglycerides: 102 mg/dL (ref <150)
VLDL: 20 mg/dL (ref 0–40)

## 2017-01-11 LAB — HEMOGLOBIN A1C
Hgb A1c MFr Bld: 5.7 % — ABNORMAL HIGH (ref 4.8–5.6)
Mean Plasma Glucose: 116.89 mg/dL

## 2017-01-11 NOTE — Telephone Encounter (Addendum)
Spoke with pt and rescheduled colonoscopy to 01/29/17. Contacted [facility] and changed.

## 2017-01-11 NOTE — Telephone Encounter (Signed)
Patient needs to reschedule her procedure. Her husband will be out of town. Please call

## 2017-01-13 ENCOUNTER — Telehealth: Payer: Self-pay

## 2017-01-13 NOTE — Telephone Encounter (Signed)
-----   Message from Erasmo Downer, MD sent at 01/12/2017  8:59 AM EDT ----- Normal Blood counts, kidney function, liver function, electrolytes, Thyroid function.  A1c is stable at 5.7, which is just into pre-diabetes range.  Low carb diet will prevent this from progressing to diabetes.  Cholesterol is elevated, but still better than last year.  10 year risk of heart disease/stroke is 7%.  A low dose statin medication would be recommended to prevent heart disease/stroke.  I will leave it up to the patient to decide if she would like to start one (likely Lipitor 20mg  daily).  Erasmo Downer, MD, MPH [facility] 01/12/2017 8:59 AM

## 2017-01-13 NOTE — Telephone Encounter (Signed)
Pt advised.  She does not want to start a cholesterol medication at this time. She is going to work on diet and exercise.  Thanks,   -Vernona RiegerLaura

## 2017-01-19 ENCOUNTER — Telehealth: Payer: Self-pay | Admitting: Gastroenterology

## 2017-01-19 NOTE — Telephone Encounter (Signed)
Patient Sonya Barnes for you to call her that she had a couple questions.

## 2017-01-21 ENCOUNTER — Ambulatory Visit
Admission: RE | Admit: 2017-01-21 | Discharge: 2017-01-21 | Disposition: A | Discharge status: Home / Self Care | Payer: Medicare HMO | Source: Ambulatory Visit | Attending: Family Medicine | Admitting: Family Medicine

## 2017-01-21 ENCOUNTER — Telehealth: Payer: Self-pay

## 2017-01-21 ENCOUNTER — Encounter: Payer: Self-pay | Admitting: *Deleted

## 2017-01-21 DIAGNOSIS — Z1231 Encounter for screening mammogram for malignant neoplasm of breast: Secondary | ICD-10-CM | POA: Insufficient documentation

## 2017-01-21 NOTE — Telephone Encounter (Signed)
Spoke with pt regarding her colon prep instructions. Pt has requested to do the Ducolax/Miralax method. These instructions were discussed with pt and she has verbalized understanding.

## 2017-01-21 NOTE — Telephone Encounter (Signed)
Pt advised.

## 2017-01-21 NOTE — Telephone Encounter (Signed)
-----   Message from Erasmo DownerAngela M Bacigalupo, MD sent at 01/21/2017  1:42 PM EDT ----- Normal mammogram  Bacigalupo, Marzella SchleinAngela M, MD, MPH [facility] 01/21/2017 1:42 PM

## 2017-01-22 ENCOUNTER — Ambulatory Visit: Admit: 2017-01-22 | Payer: Medicare HMO | Admitting: Gastroenterology

## 2017-01-27 NOTE — Discharge Instructions (Signed)
General Anesthesia, Adult, Care After °These instructions provide you with information about caring for yourself after your procedure. Your health care provider may also give you more specific instructions. Your treatment has been planned according to current medical practices, but problems sometimes occur. Call your health care provider if you have any problems or questions after your procedure. °What can I expect after the procedure? °After the procedure, it is common to have: °· Vomiting. °· A sore throat. °· Mental slowness. ° °It is common to feel: °· Nauseous. °· Cold or shivery. °· Sleepy. °· Tired. °· Sore or achy, even in parts of your body where you did not have surgery. ° °Follow these instructions at home: °For at least 24 hours after the procedure: °· Do not: °? Participate in activities where you could fall or become injured. °? Drive. °? Use heavy machinery. °? Drink alcohol. °? Take sleeping pills or medicines that cause drowsiness. °? Make important decisions or sign legal documents. °? Take care of children on your own. °· Rest. °Eating and drinking °· If you vomit, drink water, juice, or soup when you can drink without vomiting. °· Drink enough fluid to keep your urine clear or pale yellow. °· Make sure you have little or no nausea before eating solid foods. °· Follow the diet recommended by your health care provider. °General instructions °· Have a responsible adult stay with you until you are awake and alert. °· Return to your normal activities as told by your health care provider. Ask your health care provider what activities are safe for you. °· Take over-the-counter and prescription medicines only as told by your health care provider. °· If you smoke, do not smoke without supervision. °· Keep all follow-up visits as told by your health care provider. This is important. °Contact a health care provider if: °· You continue to have nausea or vomiting at home, and medicines are not helpful. °· You  cannot drink fluids or start eating again. °· You cannot urinate after 8-12 hours. °· You develop a skin rash. °· You have fever. °· You have increasing redness at the site of your procedure. °Get help right away if: °· You have difficulty breathing. °· You have chest pain. °· You have unexpected bleeding. °· You feel that you are having a life-threatening or urgent problem. °This information is not intended to replace advice given to you by your health care provider. Make sure you discuss any questions you have with your health care provider. °Document Released: 08/10/2000 Document Revised: 10/07/2015 Document Reviewed: 04/18/2015 °Elsevier Interactive Patient Education © 2018 [employer] ° °

## 2017-01-29 SURGERY — COLONOSCOPY WITH PROPOFOL
Anesthesia: Choice

## 2017-02-01 ENCOUNTER — Ambulatory Visit: Payer: Medicare HMO | Admitting: Anesthesiology

## 2017-02-01 ENCOUNTER — Encounter: Payer: Self-pay | Admitting: Anesthesiology

## 2017-02-01 ENCOUNTER — Ambulatory Visit
Admission: RE | Admit: 2017-02-01 | Discharge: 2017-02-01 | Disposition: A | Discharge status: Home / Self Care | Payer: Medicare HMO | Source: Ambulatory Visit | Attending: Gastroenterology | Admitting: Gastroenterology

## 2017-02-01 ENCOUNTER — Ambulatory Visit
Admission: RE | Disposition: A | Discharge status: Home / Self Care | Payer: Self-pay | Source: Ambulatory Visit | Attending: Gastroenterology

## 2017-02-01 DIAGNOSIS — F419 Anxiety disorder, unspecified: Secondary | ICD-10-CM | POA: Diagnosis not present

## 2017-02-01 DIAGNOSIS — M810 Age-related osteoporosis without current pathological fracture: Secondary | ICD-10-CM | POA: Insufficient documentation

## 2017-02-01 DIAGNOSIS — K219 Gastro-esophageal reflux disease without esophagitis: Secondary | ICD-10-CM | POA: Diagnosis not present

## 2017-02-01 DIAGNOSIS — Z79899 Other long term (current) drug therapy: Secondary | ICD-10-CM | POA: Diagnosis not present

## 2017-02-01 DIAGNOSIS — Z87442 Personal history of urinary calculi: Secondary | ICD-10-CM | POA: Diagnosis not present

## 2017-02-01 DIAGNOSIS — E039 Hypothyroidism, unspecified: Secondary | ICD-10-CM | POA: Diagnosis not present

## 2017-02-01 DIAGNOSIS — Z1211 Encounter for screening for malignant neoplasm of colon: Secondary | ICD-10-CM | POA: Insufficient documentation

## 2017-02-01 DIAGNOSIS — K2950 Unspecified chronic gastritis without bleeding: Secondary | ICD-10-CM | POA: Insufficient documentation

## 2017-02-01 DIAGNOSIS — Z8371 Family history of colonic polyps: Secondary | ICD-10-CM | POA: Insufficient documentation

## 2017-02-01 DIAGNOSIS — R1310 Dysphagia, unspecified: Secondary | ICD-10-CM | POA: Insufficient documentation

## 2017-02-01 DIAGNOSIS — K317 Polyp of stomach and duodenum: Secondary | ICD-10-CM | POA: Diagnosis not present

## 2017-02-01 DIAGNOSIS — K5730 Diverticulosis of large intestine without perforation or abscess without bleeding: Secondary | ICD-10-CM | POA: Insufficient documentation

## 2017-02-01 DIAGNOSIS — K2930 Chronic superficial gastritis without bleeding: Secondary | ICD-10-CM | POA: Diagnosis not present

## 2017-02-01 DIAGNOSIS — R69 Illness, unspecified: Secondary | ICD-10-CM | POA: Diagnosis not present

## 2017-02-01 DIAGNOSIS — M47816 Spondylosis without myelopathy or radiculopathy, lumbar region: Secondary | ICD-10-CM | POA: Insufficient documentation

## 2017-02-01 DIAGNOSIS — I4510 Unspecified right bundle-branch block: Secondary | ICD-10-CM | POA: Diagnosis not present

## 2017-02-01 DIAGNOSIS — Z860100 Personal history of colon polyps, unspecified: Secondary | ICD-10-CM

## 2017-02-01 DIAGNOSIS — R7303 Prediabetes: Secondary | ICD-10-CM | POA: Diagnosis not present

## 2017-02-01 DIAGNOSIS — K2970 Gastritis, unspecified, without bleeding: Secondary | ICD-10-CM | POA: Diagnosis not present

## 2017-02-01 HISTORY — PX: COLONOSCOPY: SHX5424

## 2017-02-01 HISTORY — PX: ESOPHAGEAL DILATION: SHX303

## 2017-02-01 HISTORY — DX: Gastro-esophageal reflux disease without esophagitis: K21.9

## 2017-02-01 HISTORY — DX: Age-related osteoporosis without current pathological fracture: M81.0

## 2017-02-01 HISTORY — PX: ESOPHAGOGASTRODUODENOSCOPY: SHX5428

## 2017-02-01 HISTORY — DX: Hypothyroidism, unspecified: E03.9

## 2017-02-01 HISTORY — DX: Prediabetes: R73.03

## 2017-02-01 HISTORY — DX: Unspecified osteoarthritis, unspecified site: M19.90

## 2017-02-01 HISTORY — DX: Calculus of kidney: N20.0

## 2017-02-01 HISTORY — DX: Other cervical disc degeneration, unspecified cervical region: M50.30

## 2017-02-01 SURGERY — COLONOSCOPY
Anesthesia: General | Site: Rectum | Wound class: Dirty or Infected

## 2017-02-01 MED ORDER — ONDANSETRON HCL 4 MG/2ML IJ SOLN: 4.0000 mg | Freq: Once | INTRAMUSCULAR | Status: DC | PRN

## 2017-02-01 MED ORDER — GLYCOPYRROLATE 0.2 MG/ML IJ SOLN
INTRAMUSCULAR | Status: DC | PRN
  Administered 2017-02-01: 0.1 mg via INTRAVENOUS

## 2017-02-01 MED ORDER — PROPOFOL 10 MG/ML IV BOLUS
INTRAVENOUS | Status: DC | PRN
Start: 2017-02-01 — End: 2017-02-01
  Administered 2017-02-01: 20 mg via INTRAVENOUS
  Administered 2017-02-01: 50 mg via INTRAVENOUS
  Administered 2017-02-01 (×2): 20 mg via INTRAVENOUS
  Administered 2017-02-01: 100 mg via INTRAVENOUS
  Administered 2017-02-01: 10 mg via INTRAVENOUS
  Administered 2017-02-01: 30 mg via INTRAVENOUS

## 2017-02-01 MED ORDER — STERILE WATER FOR IRRIGATION IR SOLN
Status: DC | PRN
  Administered 2017-02-01: 10:00:00

## 2017-02-01 MED ORDER — ACETAMINOPHEN 160 MG/5ML PO SOLN: 325.0000 mg | ORAL | Status: DC | PRN

## 2017-02-01 MED ORDER — LIDOCAINE HCL (CARDIAC) 20 MG/ML IV SOLN
INTRAVENOUS | Status: DC | PRN
  Administered 2017-02-01: 50 mg via INTRAVENOUS

## 2017-02-01 MED ORDER — ACETAMINOPHEN 325 MG PO TABS: 325.0000 mg | ORAL_TABLET | ORAL | Status: DC | PRN

## 2017-02-01 MED ORDER — LACTATED RINGERS IV SOLN
10.0000 mL/h | INTRAVENOUS | Status: DC
  Administered 2017-02-01: 10 mL/h via INTRAVENOUS

## 2017-02-01 SURGICAL SUPPLY — 26 items
BALLN DILATOR 15-18 8 (BALLOONS) ×3
BALLOON DILATOR 15-18 8 (BALLOONS) IMPLANT
CANISTER SUCT 1200ML W/VALVE (MISCELLANEOUS) ×3 IMPLANT
CLIP HMST 235XBRD CATH ROT (MISCELLANEOUS) IMPLANT
CLIP RESOLUTION 360 11X235 (MISCELLANEOUS)
FCP ESCP3.2XJMB 240X2.8X (MISCELLANEOUS)
FORCEPS BIOP RAD 4 LRG CAP 4 (CUTTING FORCEPS) ×1 IMPLANT
FORCEPS BIOP RJ4 240 W/NDL (MISCELLANEOUS)
FORCEPS ESCP3.2XJMB 240X2.8X (MISCELLANEOUS) IMPLANT
GOWN CVR UNV OPN BCK APRN NK (MISCELLANEOUS) ×4 IMPLANT
GOWN ISOL THUMB LOOP REG UNIV (MISCELLANEOUS) ×6
INJECTOR VARIJECT VIN23 (MISCELLANEOUS) IMPLANT
KIT DEFENDO VALVE AND CONN (KITS) IMPLANT
KIT ENDO PROCEDURE OLY (KITS) ×4 IMPLANT
MARKER SPOT ENDO TATTOO 5ML (MISCELLANEOUS) IMPLANT
PAD GROUND ADULT SPLIT (MISCELLANEOUS) IMPLANT
PROBE APC STR FIRE (PROBE) IMPLANT
RETRIEVER NET ROTH 2.5X230 LF (MISCELLANEOUS) IMPLANT
SNARE SHORT THROW 13M SML OVAL (MISCELLANEOUS) IMPLANT
SNARE SHORT THROW 30M LRG OVAL (MISCELLANEOUS) IMPLANT
SNARE SNG USE RND 15MM (INSTRUMENTS) IMPLANT
SPOT EX ENDOSCOPIC TATTOO (MISCELLANEOUS)
SYR INFLATION 60ML (SYRINGE) ×1 IMPLANT
TRAP ETRAP POLY (MISCELLANEOUS) IMPLANT
VARIJECT INJECTOR VIN23 (MISCELLANEOUS)
WATER STERILE IRR 250ML POUR (IV SOLUTION) ×3 IMPLANT

## 2017-02-01 NOTE — Transfer of Care (Signed)
Immediate Anesthesia Transfer of Care Note  Patient: Sonya Barnes  Procedure(s) Performed: Procedure(s): COLONOSCOPY (N/A) ESOPHAGOGASTRODUODENOSCOPY (EGD) (N/A) ESOPHAGEAL DILATION (N/A)  Patient Location: PACU  Anesthesia Type: General  Level of Consciousness: awake, alert  and patient cooperative  Airway and Oxygen Therapy: Patient Spontanous Breathing and Patient connected to supplemental oxygen  Post-op Assessment: Post-op Vital signs reviewed, Patient's Cardiovascular Status Stable, Respiratory Function Stable, Patent Airway and No signs of Nausea or vomiting  Post-op Vital Signs: Reviewed and stable  Complications: No apparent anesthesia complications

## 2017-02-01 NOTE — Addendum Note (Signed)
Addendum  created 02/01/17 1109 by Orrin Brigham, MD   Sign clinical note

## 2017-02-01 NOTE — H&P (Signed)
Midge Minium, MD [facility] [address], [address] [place], [place] [address] [phone] Fax : [phone]  Primary Care Physician:  Erasmo Downer, MD Primary Gastroenterologist:  Dr. Servando Snare  Pre-Procedure History & Physical: HPI:  Sonya Barnes is a 71 y.o. female is here for an endoscopy and colonoscopy.   Past Medical History:  Diagnosis Date  . Allergy   . Anxiety   . Arthritis    lower spine  . Bundle branch block, right   . Degenerative disc disease, cervical    pt reports no limitations  . GERD (gastroesophageal reflux disease)   . Hypothyroidism   . Kidney stone   . Osteoporosis    back, hips  . Pre-diabetes   . Tendonitis of elbow, right     Past Surgical History:  Procedure Laterality Date  . BREAST BIOPSY Left 15 years ago   Core BX of calcs. Negtive  . BREAST CYST ASPIRATION Right 15 years ago   Negative  . BREAST SURGERY Left    biopsy  . DILATION AND CURETTAGE OF UTERUS  1956-57  . HAMMER TOE SURGERY Bilateral   . OVARIAN CYST REMOVAL  1967  . THYROIDECTOMY, PARTIAL    . TONSILLECTOMY      Prior to Admission medications   Medication Sig Start Date End Date Taking? Authorizing Provider  alendronate (FOSAMAX) 70 MG tablet Take 1 tablet (70 mg total) by mouth once a week. Take with a full glass of water on an empty stomach. 01/08/16  Yes Fisher, Demetrios Isaacs, MD  ALPRAZolam Prudy Feeler) 0.5 MG tablet Take 1 tablet (0.5 mg total) by mouth 2 (two) times daily as needed. 03/11/15  Yes Lorie Phenix, MD  Calcium-Vitamin D-Vitamin K (VIACTIV PO) Take by mouth daily.   Yes [provider]  cetirizine (ZYRTEC) 10 MG tablet Take 1 tablet by mouth daily.   Yes [provider]  fluticasone (FLONASE) 50 MCG/ACT nasal spray Place 2 sprays into the nose daily. 2 sprays each nostril 11/24/12  Yes [provider]  levothyroxine (SYNTHROID, LEVOTHROID) 50 MCG tablet Take 1 tablet (50 mcg total) by mouth daily. 07/22/16  Yes Malva Limes, MD    Melatonin 1 MG TABS Take 6 mg by mouth at bedtime as needed.    Yes [provider]  Multiple Vitamin (MULTIVITAMIN) tablet Take 1 tablet by mouth daily.   Yes [provider]  omeprazole (PRILOSEC) 40 MG capsule Take 1 capsule (40 mg total) by mouth daily. 07/22/16  Yes Malva Limes, MD  Polyethyl Glycol-Propyl Glycol (SYSTANE ULTRA OP) Apply to eye.   Yes [provider]  ranitidine (ZANTAC) 150 MG tablet Take 1 tablet by mouth daily. As needed   Yes [provider]    Allergies as of 01/11/2017  . (No Known Allergies)    Family History  Problem Relation Age of Onset  . Arthritis Mother   . Cancer Mother        lung  . Lung disease Mother   . Osteoporosis Mother   . Diabetes Father   . Stroke Father   . Heart disease Father   . Heart disease Sister   . Lung disease Sister   . Osteoporosis Sister   . Colon polyps Sister   . Early death Brother   . Mental illness Brother   . Liver disease Brother   . Breast cancer Neg Hx     Social History   Social History  . Marital status: Married  Spouse name: Kathlene November  . Number of children: 2  . Years of education: associates   Occupational History  . retired Sports coach   Social History Main Topics  . Smoking status: Never Smoker  . Smokeless tobacco: Never Used  . Alcohol use No  . Drug use: No  . Sexual activity: Yes   Other Topics Concern  . Not on file   Social History Narrative  . No narrative on file    Review of Systems: See HPI, otherwise negative ROS  Physical Exam: BP (!) 99/52   Pulse (!) 46   Temp (!) 97.5 F (36.4 C) (Temporal)   Resp 16   Ht  (1.651 m)   Wt 133 lb (60.3 kg)   SpO2 100%   BMI 22.13 kg/m  General:   Alert,  pleasant and cooperative in NAD Head:  Normocephalic and atraumatic. Neck:  Supple; no masses or thyromegaly. Lungs:  Clear throughout to auscultation.    Heart:  Regular rate and rhythm. Abdomen:  Soft, nontender and nondistended.  Normal bowel sounds, without guarding, and without rebound.   Neurologic:  Alert and  oriented x4;  grossly normal neurologically.  Impression/Plan: Sonya Barnes is here for an endoscopy and colonoscopy to be performed for Dysphagia and history of polyps  Risks, benefits, limitations, and alternatives regarding  endoscopy and colonoscopy have been reviewed with the patient.  Questions have been answered.  All parties agreeable.   Midge Minium, MD  02/01/2017, 9:37 AM

## 2017-02-01 NOTE — Anesthesia Postprocedure Evaluation (Addendum)
Anesthesia Post Note  Patient: Sonya Barnes  Procedure(s) Performed: Procedure(s) (LRB): COLONOSCOPY (N/A) ESOPHAGOGASTRODUODENOSCOPY (EGD) (N/A) ESOPHAGEAL DILATION (N/A)  Patient location during evaluation: PACU Anesthesia Type: General Level of consciousness: awake and alert Pain management: pain level controlled Vital Signs Assessment: post-procedure vital signs reviewed and stable Respiratory status: spontaneous breathing, nonlabored ventilation, respiratory function stable and patient connected to nasal cannula oxygen Cardiovascular status: blood pressure returned to baseline and stable Postop Assessment: no apparent nausea or vomiting Anesthetic complications: no Comments: Pt has hx of dry eyes. Upon discharge, pt c/o eye dryness. Pt given lubricant eye drops prior to d/c.    Orrin Brigham

## 2017-02-01 NOTE — Anesthesia Preprocedure Evaluation (Signed)
Anesthesia Evaluation  Patient identified by MRN, date of birth, ID band  Reviewed: NPO status   History of Anesthesia Complications Negative for: history of anesthetic complications  Airway Mallampati: II  TM Distance: >3 FB Neck ROM: full    Dental no notable dental hx.    Pulmonary neg pulmonary ROS,    Pulmonary exam normal        Cardiovascular Exercise Tolerance: Good Normal cardiovascular exam+ dysrhythmias (rbbb)      Neuro/Psych Anxiety negative neurological ROS     GI/Hepatic Neg liver ROS, GERD  Medicated,  Endo/Other  Hypothyroidism   Renal/GU negative Renal ROS  negative genitourinary   Musculoskeletal  (+) Arthritis ,   Abdominal   Peds  Hematology negative hematology ROS (+)   Anesthesia Other Findings tiva  Reproductive/Obstetrics                             Anesthesia Physical Anesthesia Plan  ASA: II  Anesthesia Plan: General   Post-op Pain Management:    Induction:   PONV Risk Score and Plan:   Airway Management Planned:   Additional Equipment:   Intra-op Plan:   Post-operative Plan:   Informed Consent: I have reviewed the patients History and Physical, chart, labs and discussed the procedure including the risks, benefits and alternatives for the proposed anesthesia with the patient or authorized representative who has indicated his/her understanding and acceptance.     Plan Discussed with: CRNA  Anesthesia Plan Comments:         Anesthesia Quick Evaluation

## 2017-02-01 NOTE — Op Note (Signed)
[facility] Patient Name: Sonya Barnes Procedure Date: 02/01/2017 9:58 AM MRN: [id number] Account #: [id number] Date of Birth: 1945/07/25 Admit Type: Outpatient Age: 71 Room: [facility] OR ROOM 01 Gender: Female Note Status: Finalized Procedure:            Colonoscopy Indications:          High risk colon cancer surveillance: Personal history                        of colonic polyps Providers:            Lucilla Lame MD, MD Medicines:            Propofol per Anesthesia Complications:        No immediate complications. Procedure:            Pre-Anesthesia Assessment:                       - Prior to the procedure, a History and Physical was                        performed, and patient medications and allergies were                        reviewed. The patient's tolerance of previous                        anesthesia was also reviewed. The risks and benefits of                        the procedure and the sedation options and risks were                        discussed with the patient. All questions were                        answered, and informed consent was obtained. Prior                        Anticoagulants: The patient has taken no previous                        anticoagulant or antiplatelet agents. ASA Grade                        Assessment: II - A patient with mild systemic disease.                        After reviewing the risks and benefits, the patient was                        deemed in satisfactory condition to undergo the                        procedure.                       After obtaining informed consent, the colonoscope was                        passed under direct vision. Throughout the  procedure,                        the patient's blood pressure, pulse, and oxygen                        saturations were monitored continuously. The [place] [id number]) was introduced through the                anus and advanced to the the cecum, identified by                        appendiceal orifice and ileocecal valve. The                        colonoscopy was performed without difficulty. The                        patient tolerated the procedure well. The quality of                        the bowel preparation was excellent. Findings:      The perianal and digital rectal examinations were normal.      A few small-mouthed diverticula were found in the sigmoid colon. Impression:           - Diverticulosis in the sigmoid colon.                       - No specimens collected. Recommendation:       - Discharge patient to home.                       - Resume previous diet.                       - Continue present medications.                       - Repeat colonoscopy in 5 years for surveillance. Procedure Code(s):    --- Professional ---                       [phone], Colonoscopy, flexible; diagnostic, including                        collection of specimen(s) by brushing or washing, when                        performed (separate procedure) Diagnosis Code(s):    --- Professional ---                       Z86.010, Personal history of colonic polyps CPT copyright 2016 American Medical Association. All rights reserved. The codes documented in this report are preliminary and upon coder review may  be revised to meet current compliance requirements. Lucilla Lame MD, MD 02/01/2017 10:32:59 AM This report has been signed electronically. Number of Addenda: 0 Note Initiated On: 02/01/2017 9:58 AM Scope Withdrawal Time: 0 hours 6 minutes 23 seconds  Total Procedure Duration: 0 hours 9 minutes 14 seconds  [facility]

## 2017-02-01 NOTE — Anesthesia Procedure Notes (Signed)
Date/Time: 02/01/2017 10:07 AM Performed by: Maree Krabbe Pre-anesthesia Checklist: Patient identified, Emergency Drugs available, Suction available, Timeout performed and Patient being monitored Patient Re-evaluated:Patient Re-evaluated prior to induction Oxygen Delivery Method: Nasal cannula Placement Confirmation: positive ETCO2

## 2017-02-01 NOTE — Op Note (Signed)
[facility] Patient Name: Sonya Barnes Procedure Date: 02/01/2017 9:59 AM MRN: [id number] Account #: [id number] Date of Birth: 09-17-1945 Admit Type: Outpatient Age: 71 Room: [facility] OR ROOM 1 Gender: Female Note Status: Finalized Procedure:            Upper GI endoscopy Indications:          Dysphagia Providers:            Midge Minium MD, MD Referring MD:         Marzella Schlein. Bacigalupo (Referring MD) Medicines:            Propofol per Anesthesia Complications:        No immediate complications. Procedure:            Pre-Anesthesia Assessment:                       - Prior to the procedure, a History and Physical was                        performed, and patient medications and allergies were                        reviewed. The patient's tolerance of previous                        anesthesia was also reviewed. The risks and benefits of                        the procedure and the sedation options and risks were                        discussed with the patient. All questions were                        answered, and informed consent was obtained. Prior                        Anticoagulants: The patient has taken no previous                        anticoagulant or antiplatelet agents. ASA Grade                        Assessment: II - A patient with mild systemic disease.                        After reviewing the risks and benefits, the patient was                        deemed in satisfactory condition to undergo the                        procedure.                       After obtaining informed consent, the endoscope was                        passed under direct vision. Throughout the procedure,  the patient's blood pressure, pulse, and oxygen                        saturations were monitored continuously. The Olympus                        [id number]-HQ190 Endoscope (S#. [id number]) was introduced                        through the  mouth, and advanced to the second part of                        duodenum. The upper GI endoscopy was accomplished                        without difficulty. The patient tolerated the procedure                        well. Findings:      The examined esophagus was normal. A TTS dilator was passed through the       scope. Dilation with a 15-16.5-18 mm balloon dilator was performed to 18       mm. The dilation site was examined following endoscope reinsertion and       showed complete resolution of luminal narrowing.      Multiple sessile polyps with no bleeding and no stigmata of recent       bleeding were found in the gastric body. Biopsies were taken with a cold       forceps for histology.      Localized moderate inflammation characterized by erosions was found in       the gastric body.      The examined duodenum was normal. Impression:           - Normal esophagus. Dilated.                       - Multiple gastric polyps. Biopsied.                       - Gastritis.                       - Normal examined duodenum. Recommendation:       - Await pathology results.                       - Discharge patient to home.                       - Resume previous diet.                       - Continue present medications.                       - Await pathology results. Procedure Code(s):    --- Professional ---                       [phone], Esophagogastroduodenoscopy, flexible, transoral;                        with transendoscopic balloon dilation of esophagus                        (  less than 30 mm diameter)                       [id number], Esophagogastroduodenoscopy, flexible, transoral;                        with biopsy, single or multiple Diagnosis Code(s):    --- Professional ---                       R13.10, Dysphagia, unspecified                       K31.7, Polyp of stomach and duodenum                       K29.70, Gastritis, unspecified, without bleeding CPT copyright 2016 American  Medical Association. All rights reserved. The codes documented in this report are preliminary and upon coder review may  be revised to meet current compliance requirements. Midge Minium MD, MD 02/01/2017 10:21:16 AM This report has been signed electronically. Number of Addenda: 0 Note Initiated On: 02/01/2017 9:59 AM      [facility]

## 2017-02-02 ENCOUNTER — Encounter: Payer: Self-pay | Admitting: Gastroenterology

## 2017-02-04 ENCOUNTER — Ambulatory Visit: Payer: Medicare HMO

## 2017-02-09 ENCOUNTER — Encounter: Payer: Self-pay | Admitting: Gastroenterology

## 2017-02-10 ENCOUNTER — Encounter: Payer: Self-pay | Admitting: Gastroenterology

## 2017-02-11 ENCOUNTER — Ambulatory Visit (INDEPENDENT_AMBULATORY_CARE_PROVIDER_SITE_OTHER): Payer: Medicare HMO

## 2017-02-11 DIAGNOSIS — Z23 Encounter for immunization: Secondary | ICD-10-CM | POA: Diagnosis not present

## 2017-02-20 ENCOUNTER — Other Ambulatory Visit: Payer: Self-pay | Admitting: Family Medicine

## 2017-06-15 DIAGNOSIS — R69 Illness, unspecified: Secondary | ICD-10-CM | POA: Diagnosis not present

## 2017-10-20 ENCOUNTER — Other Ambulatory Visit: Payer: Self-pay | Admitting: Family Medicine

## 2017-10-22 ENCOUNTER — Other Ambulatory Visit: Payer: Self-pay | Admitting: Family Medicine

## 2017-10-22 DIAGNOSIS — K219 Gastro-esophageal reflux disease without esophagitis: Secondary | ICD-10-CM

## 2017-12-06 DIAGNOSIS — H524 Presbyopia: Secondary | ICD-10-CM | POA: Diagnosis not present

## 2017-12-08 ENCOUNTER — Other Ambulatory Visit: Payer: Self-pay | Admitting: Family Medicine

## 2017-12-08 DIAGNOSIS — Z1231 Encounter for screening mammogram for malignant neoplasm of breast: Secondary | ICD-10-CM

## 2017-12-15 DIAGNOSIS — R69 Illness, unspecified: Secondary | ICD-10-CM | POA: Diagnosis not present

## 2018-01-06 ENCOUNTER — Ambulatory Visit: Payer: Self-pay

## 2018-01-07 ENCOUNTER — Encounter: Payer: Self-pay | Admitting: Family Medicine

## 2018-01-21 ENCOUNTER — Encounter: Payer: Self-pay | Admitting: Family Medicine

## 2018-01-21 ENCOUNTER — Ambulatory Visit (INDEPENDENT_AMBULATORY_CARE_PROVIDER_SITE_OTHER): Payer: Medicare HMO | Admitting: Family Medicine

## 2018-01-21 VITALS — BP 102/60 | HR 52 | Temp 98.0°F | Ht 65.0 in | Wt 130.8 lb

## 2018-01-21 DIAGNOSIS — E890 Postprocedural hypothyroidism: Secondary | ICD-10-CM

## 2018-01-21 DIAGNOSIS — R7303 Prediabetes: Secondary | ICD-10-CM | POA: Diagnosis not present

## 2018-01-21 DIAGNOSIS — Z23 Encounter for immunization: Secondary | ICD-10-CM | POA: Diagnosis not present

## 2018-01-21 DIAGNOSIS — M810 Age-related osteoporosis without current pathological fracture: Secondary | ICD-10-CM | POA: Diagnosis not present

## 2018-01-21 DIAGNOSIS — R69 Illness, unspecified: Secondary | ICD-10-CM | POA: Diagnosis not present

## 2018-01-21 DIAGNOSIS — F5101 Primary insomnia: Secondary | ICD-10-CM | POA: Diagnosis not present

## 2018-01-21 DIAGNOSIS — Z0000 Encounter for general adult medical examination without abnormal findings: Secondary | ICD-10-CM | POA: Diagnosis not present

## 2018-01-21 DIAGNOSIS — E7800 Pure hypercholesterolemia, unspecified: Secondary | ICD-10-CM

## 2018-01-21 MED ORDER — GABAPENTIN 300 MG PO CAPS: 300.0000 mg | ORAL_CAPSULE | Freq: Every day | ORAL | 1 refills | Status: DC

## 2018-01-21 NOTE — Progress Notes (Signed)
Patient: Sonya Barnes, Female    DOB: 1945/05/31, 72 y.o.   MRN: [id number] Visit Date: 01/21/2018  Today's Provider: Shirlee Latch, MD   Chief Complaint  Patient presents with  . Medicare Wellness   Subjective:    Annual wellness visit Sonya Barnes is a 72 y.o. female who presents today for her Subsequent Annual Wellness Visit. She feels well. She reports exercising 3-5 days per week. She reports she is not sleeping well.  She feels tired and irritable all the time.  She is taking melatonin which was initially helping, but does not seem to be helping anymore.  She has both trouble falling asleep and staying asleep.  She is practicing good sleep hygiene.  She states she is tried several sleeping pills in the past and none seem to work, but she does not remember the names of any of these.  Some of the problem seems to be her hot flashes that she has all through the night that wake her up and caused her to be uncomfortable.  She is not interested in hormone replacement therapy.  She has tried some leftover Xanax which does help with sleep, but she is not interested in taking this long-term as she knows the risks. -----------------------------------------------------------   Review of Systems  Constitutional: Negative.   HENT: Negative.   Eyes: Negative.   Respiratory: Negative.   Cardiovascular: Negative.   Gastrointestinal: Negative.   Endocrine: Negative.   Genitourinary: Negative.   Musculoskeletal: Negative.   Skin: Negative.   Allergic/Immunologic: Negative.   Neurological: Negative.   Hematological: Negative.   Psychiatric/Behavioral: Negative.     Social History   Socioeconomic History  . Marital status: Married    Spouse name: Kathlene November  . Number of children: 2  . Years of education: associates  . Highest education level: Not on file  Occupational History  . Occupation: retired    Associate Professor: [employer]  Social Needs  . Financial resource strain: Not on file  . Food  insecurity:    Worry: Not on file    Inability: Not on file  . Transportation needs:    Medical: Not on file    Non-medical: Not on file  Tobacco Use  . Smoking status: Never Smoker  . Smokeless tobacco: Never Used  Substance and Sexual Activity  . Alcohol use: No    Alcohol/week: 0.0 standard drinks  . Drug use: No  . Sexual activity: Yes  Lifestyle  . Physical activity:    Days per week: Not on file    Minutes per session: Not on file  . Stress: Not on file  Relationships  . Social connections:    Talks on phone: Not on file    Gets together: Not on file    Attends religious service: Not on file    Active member of club or organization: Not on file    Attends meetings of clubs or organizations: Not on file    Relationship status: Not on file  . Intimate partner violence:    Fear of current or ex partner: Not on file    Emotionally abused: Not on file    Physically abused: Not on file    Forced sexual activity: Not on file  Other Topics Concern  . Not on file  Social History Narrative  . Not on file    Patient Active Problem List   Diagnosis Date Noted  . Problems with swallowing and mastication   . Gastric polyp   . Personal  history of colonic polyps   . Healthcare maintenance 01/06/2017  . Hypothyroidism associated with surgical procedure 01/07/2016  . Acid reflux 06/10/2015  . Abnormal ECG 09/07/2014  . Allergic rhinitis 09/07/2014  . Anxiety 09/07/2014  . Arthritis 09/07/2014  . Clinical depression 09/07/2014  . Blood glucose elevated 09/07/2014  . Cannot sleep 09/07/2014  . Osteoporosis 10/16/2008  . Big thyroid 10/16/2008  . Hypercholesteremia 10/16/2008    Past Surgical History:  Procedure Laterality Date  . BREAST BIOPSY Left 15 years ago   Core BX of calcs. Negtive  . BREAST CYST ASPIRATION Right 15 years ago   Negative  . BREAST SURGERY Left    biopsy  . COLONOSCOPY N/A 02/01/2017   Procedure: COLONOSCOPY;  Surgeon: Midge Minium, MD;   Location: [facility];  Service: Gastroenterology;  Laterality: N/A;  . DILATION AND CURETTAGE OF UTERUS  1956-57  . ESOPHAGEAL DILATION N/A 02/01/2017   Procedure: ESOPHAGEAL DILATION;  Surgeon: Midge Minium, MD;  Location: [facility];  Service: Gastroenterology;  Laterality: N/A;  . ESOPHAGOGASTRODUODENOSCOPY N/A 02/01/2017   Procedure: ESOPHAGOGASTRODUODENOSCOPY (EGD);  Surgeon: Midge Minium, MD;  Location: [facility];  Service: Gastroenterology;  Laterality: N/A;  . HAMMER TOE SURGERY Bilateral   . OVARIAN CYST REMOVAL  1967  . THYROIDECTOMY, PARTIAL    . TONSILLECTOMY      Her family history includes Arthritis in her mother; Cancer in her mother; Colon polyps in her sister; Diabetes in her father; Early death in her brother; Heart disease in her father and sister; Liver disease in her brother; Lung disease in her mother and sister; Mental illness in her brother; Osteoporosis in her mother and sister; Stroke in her father.     Previous Medications   ALENDRONATE (FOSAMAX) 70 MG TABLET    TAKE 1 TABLET BY MOUTH ONCE WEEKLY. TAKE WITH FULL GLASS OF WATER ON AN EMPTY STOMACH   ALPRAZOLAM (XANAX) 0.5 MG TABLET    Take 1 tablet (0.5 mg total) by mouth 2 (two) times daily as needed.   CALCIUM-VITAMIN D-VITAMIN K (VIACTIV PO)    Take by mouth daily.   CETIRIZINE (ZYRTEC) 10 MG TABLET    Take 1 tablet by mouth daily.   FLUTICASONE (FLONASE) 50 MCG/ACT NASAL SPRAY    Place 2 sprays into the nose daily. 2 sprays each nostril   LEVOTHYROXINE (SYNTHROID, LEVOTHROID) 50 MCG TABLET    TAKE 1 TABLET (50 MCG TOTAL) BY MOUTH DAILY.   MELATONIN 1 MG TABS    Take 6 mg by mouth at bedtime as needed.    MULTIPLE VITAMIN (MULTIVITAMIN) TABLET    Take 1 tablet by mouth daily.   OMEPRAZOLE (PRILOSEC) 40 MG CAPSULE    TAKE 1 CAPSULE (40 MG TOTAL) BY MOUTH DAILY.   POLYETHYL GLYCOL-PROPYL GLYCOL (SYSTANE ULTRA OP)    Apply to eye.   RANITIDINE (ZANTAC) 150 MG TABLET    Take 1 tablet by  mouth daily. As needed    Patient Care Team: Erasmo Downer, MD as PCP - General (Family Medicine)      Objective:   Vitals: BP 102/60 (BP Location: Right Arm, Patient Position: Sitting, Cuff Size: Normal)   Pulse (!) 52   Temp 98 F (36.7 C) (Oral)   Ht 5\' 5"  (1.651 m)   Wt 130 lb 12.8 oz (59.3 kg)   SpO2 99%   BMI 21.77 kg/m   Physical Exam  Constitutional: She is oriented to person, place, and time. She appears well-developed and  well-nourished. No distress.  HENT:  Head: Normocephalic and atraumatic.  Right Ear: External ear normal.  Left Ear: External ear normal.  Nose: Nose normal.  Mouth/Throat: Oropharynx is clear and moist.  Eyes: Pupils are equal, round, and reactive to light. Conjunctivae and EOM are normal. No scleral icterus.  Neck: Neck supple. No thyromegaly present.  Cardiovascular: Normal rate, regular rhythm, normal heart sounds and intact distal pulses.  No murmur heard. Pulmonary/Chest: Effort normal and breath sounds normal. No respiratory distress. She has no wheezes. She has no rales.  Abdominal: Soft. Bowel sounds are normal. She exhibits no distension. There is no tenderness. There is no rebound and no guarding.  Musculoskeletal: She exhibits no edema or deformity.  Lymphadenopathy:    She has no cervical adenopathy.  Neurological: She is alert and oriented to person, place, and time.  Skin: Skin is warm and dry. Capillary refill takes less than 2 seconds. No rash noted.  Psychiatric: She has a normal mood and affect. Her behavior is normal.  Vitals reviewed.   Activities of Daily Living In your present state of health, do you have any difficulty performing the following activities: 02/01/2017  Hearing? N  Vision? N  Difficulty concentrating or making decisions? N  Walking or climbing stairs? N  Dressing or bathing? N  Some recent data might be hidden    Fall Risk Assessment Fall Risk  01/05/2017 10/23/2015 10/25/2014 10/08/2014  Falls in  the past year? No No No No     Depression Screen PHQ 2/9 Scores 01/05/2017 10/23/2015 10/23/2015 10/25/2014  PHQ - 2 Score 2 2 2  0  PHQ- 9 Score 8 11 - -    Cognitive Testing - 6-CIT  Correct? Score   What year is it? yes 0 0 or 4  What month is it? yes 0 0 or 3  Memorize:    Floyde Parkins,  42,  [address],  [place],      What time is it? (within 1 hour) yes 0 0 or 3  Count backwards from 20 yes 0 0, 2, or 4  Name the months of the year yes 0 0, 2, or 4  Repeat name & address above yes 1 0, 2, 4, 6, 8, or 10       TOTAL SCORE  1/28   Interpretation:  Normal  Normal (0-7) Abnormal (8-28)     Assessment & Plan:     Annual Wellness Visit  Reviewed patient's Family Medical History Reviewed and updated list of patient's medical providers Assessment of cognitive impairment was done Assessed patient's functional ability Established a written schedule for health screening services Health Risk Assessent Completed and Reviewed  Exercise Activities and Dietary recommendations Goals    . Exercise 150 minutes per week (moderate activity)    . Increase water intake     Recommend increasing water intake to 6-8 glasses of water a day.        Immunization History  Administered Date(s) Administered  . DTaP 05/18/1996  . Influenza, High Dose Seasonal PF 02/27/2016, 02/11/2017  . Influenza-Unspecified 01/17/2015  . Pneumococcal Conjugate-13 01/26/2014  . Pneumococcal Polysaccharide-23 02/15/2015  . Zoster 12/24/2010    Health Maintenance  Topic Date Due  . INFLUENZA VACCINE  12/16/2017  . TETANUS/TDAP  05/18/2026 (Originally 10/13/1964)  . MAMMOGRAM  01/22/2019  . COLONOSCOPY  02/01/2022  . DEXA SCAN  Completed  . Hepatitis C Screening  Completed  . PNA vac Low Risk Adult  Completed  Discussed health benefits of physical activity, and encouraged her to engage in regular exercise appropriate for her age and condition.     ------------------------------------------------------------------------------------------------------------  Problem List Items Addressed This Visit      Endocrine   Hypothyroidism associated with surgical procedure    Continue Synthroid at current dose Patient asymptomatic and well controlled Recheck TSH and change Synthroid dose as indicated by results      Relevant Orders   TSH     Musculoskeletal and Integument   Osteoporosis    Doing well on Fosamax which was started in 12/2015 Her last DEXA scan in 11/2015 showed spinal T score of -3.9 and hip T score -2.7 We will recheck DEXA as it has been 2 years since this was last checked      Relevant Orders   DG Bone Density     Other   Hypercholesteremia    Not currently taking any medication Recheck lipid panel and CMP      Relevant Orders   Comprehensive metabolic panel   Lipid panel   Prediabetes    Discussed diet and exercise Recheck A1c      Relevant Orders   Hemoglobin A1c   Cannot sleep    Chronic problem Melatonin is no longer working well Some is related to her menopausal symptoms We will try gabapentin 300 mg nightly to see if this helps with both sleep and vasomotor symptoms of menopause Follow-up in 1 month      Relevant Orders   CBC w/Diff/Platelet    Other Visit Diagnoses    Medicare annual wellness visit, subsequent    -  Primary   Relevant Orders   CBC w/Diff/Platelet   Need for influenza vaccination       Relevant Orders   Flu vaccine HIGH DOSE PF (Completed)       Return in about 4 weeks (around 02/18/2018) for insomnia f/u.   The entirety of the information documented in the History of Present Illness, Review of Systems and Physical Exam were personally obtained by me. Portions of this information were initially documented by Presley Raddle, CMA and reviewed by me for thoroughness and accuracy.    Erasmo Downer, MD, MPH [facility] 01/21/2018 4:12 PM

## 2018-01-21 NOTE — Assessment & Plan Note (Signed)
Chronic problem Melatonin is no longer working well Some is related to her menopausal symptoms We will try gabapentin 300 mg nightly to see if this helps with both sleep and vasomotor symptoms of menopause Follow-up in 1 month

## 2018-01-21 NOTE — Assessment & Plan Note (Signed)
Continue Synthroid at current dose Patient asymptomatic and well controlled Recheck TSH and change Synthroid dose as indicated by results

## 2018-01-21 NOTE — Assessment & Plan Note (Signed)
Discussed diet and exercise Recheck A1c 

## 2018-01-21 NOTE — Assessment & Plan Note (Signed)
Doing well on Fosamax which was started in 12/2015 Her last DEXA scan in 11/2015 showed spinal T score of -3.9 and hip T score -2.7 We will recheck DEXA as it has been 2 years since this was last checked

## 2018-01-21 NOTE — Assessment & Plan Note (Signed)
Not currently taking any medication Recheck lipid panel and CMP

## 2018-01-21 NOTE — Patient Instructions (Signed)
 The CDC recommends two doses of Shingrix (the shingles vaccine) separated by 2 to 6 months for adults age 72 years and older. I recommend checking with your insurance plan regarding coverage for this vaccine.     Preventive Care 65 Years and Older, Female Preventive care refers to lifestyle choices and visits with your health care provider that can promote health and wellness. What does preventive care include?  A yearly physical exam. This is also called an annual well check.  Dental exams once or twice a year.  Routine eye exams. Ask your health care provider how often you should have your eyes checked.  Personal lifestyle choices, including: ? Daily care of your teeth and gums. ? Regular physical activity. ? Eating a healthy diet. ? Avoiding tobacco and drug use. ? Limiting alcohol use. ? Practicing safe sex. ? Taking low-dose aspirin every day. ? Taking vitamin and mineral supplements as recommended by your health care provider. What happens during an annual well check? The services and screenings done by your health care provider during your annual well check will depend on your age, overall health, lifestyle risk factors, and family history of disease. Counseling Your health care provider may ask you questions about your:  Alcohol use.  Tobacco use.  Drug use.  Emotional well-being.  Home and relationship well-being.  Sexual activity.  Eating habits.  History of falls.  Memory and ability to understand (cognition).  Work and work environment.  Reproductive health.  Screening You may have the following tests or measurements:  Height, weight, and BMI.  Blood pressure.  Lipid and cholesterol levels. These may be checked every 5 years, or more frequently if you are over 72 years old.  Skin check.  Lung cancer screening. You may have this screening every year starting at age 55 if you have a 30-pack-year history of smoking and currently smoke or have  quit within the past 15 years.  Fecal occult blood test (FOBT) of the stool. You may have this test every year starting at age 72.  Flexible sigmoidoscopy or colonoscopy. You may have a sigmoidoscopy every 5 years or a colonoscopy every 10 years starting at age 72.  Hepatitis C blood test.  Hepatitis B blood test.  Sexually transmitted disease (STD) testing.  Diabetes screening. This is done by checking your blood sugar (glucose) after you have not eaten for a while (fasting). You may have this done every 1-3 years.  Bone density scan. This is done to screen for osteoporosis. You may have this done starting at age 65.  Mammogram. This may be done every 1-2 years. Talk to your health care provider about how often you should have regular mammograms.  Talk with your health care provider about your test results, treatment options, and if necessary, the need for more tests. Vaccines Your health care provider may recommend certain vaccines, such as:  Influenza vaccine. This is recommended every year.  Tetanus, diphtheria, and acellular pertussis (Tdap, Td) vaccine. You may need a Td booster every 10 years.  Varicella vaccine. You may need this if you have not been vaccinated.  Zoster vaccine. You may need this after age 60.  Measles, mumps, and rubella (MMR) vaccine. You may need at least one dose of MMR if you were born in 1957 or later. You may also need a second dose.  Pneumococcal 13-valent conjugate (PCV13) vaccine. One dose is recommended after age 65.  Pneumococcal polysaccharide (PPSV23) vaccine. One dose is recommended after age 65.    Meningococcal vaccine. You may need this if you have certain conditions.  Hepatitis A vaccine. You may need this if you have certain conditions or if you travel or work in places where you may be exposed to hepatitis A.  Hepatitis B vaccine. You may need this if you have certain conditions or if you travel or work in places where you may be  exposed to hepatitis B.  Haemophilus influenzae type b (Hib) vaccine. You may need this if you have certain conditions.  Talk to your health care provider about which screenings and vaccines you need and how often you need them. This information is not intended to replace advice given to you by your health care provider. Make sure you discuss any questions you have with your health care provider. Document Released: 05/31/2015 Document Revised: 01/22/2016 Document Reviewed: 03/05/2015 Elsevier Interactive Patient Education  2018 Elsevier Inc.  

## 2018-01-24 DIAGNOSIS — E7800 Pure hypercholesterolemia, unspecified: Secondary | ICD-10-CM | POA: Diagnosis not present

## 2018-01-24 DIAGNOSIS — R69 Illness, unspecified: Secondary | ICD-10-CM | POA: Diagnosis not present

## 2018-01-24 DIAGNOSIS — R7303 Prediabetes: Secondary | ICD-10-CM | POA: Diagnosis not present

## 2018-01-24 DIAGNOSIS — E890 Postprocedural hypothyroidism: Secondary | ICD-10-CM | POA: Diagnosis not present

## 2018-01-24 DIAGNOSIS — Z0000 Encounter for general adult medical examination without abnormal findings: Secondary | ICD-10-CM | POA: Diagnosis not present

## 2018-01-25 ENCOUNTER — Encounter: Payer: Self-pay | Admitting: Family Medicine

## 2018-01-25 ENCOUNTER — Telehealth: Payer: Self-pay

## 2018-01-25 ENCOUNTER — Ambulatory Visit
Admission: RE | Admit: 2018-01-25 | Discharge: 2018-01-25 | Disposition: A | Discharge status: Home / Self Care | Payer: Medicare HMO | Source: Ambulatory Visit | Attending: Family Medicine | Admitting: Family Medicine

## 2018-01-25 DIAGNOSIS — Z1231 Encounter for screening mammogram for malignant neoplasm of breast: Secondary | ICD-10-CM | POA: Insufficient documentation

## 2018-01-25 LAB — COMPREHENSIVE METABOLIC PANEL
ALT: 9 IU/L (ref 0–32)
AST: 20 IU/L (ref 0–40)
Albumin/Globulin Ratio: 2 (ref 1.2–2.2)
Albumin: 4.1 g/dL (ref 3.5–4.8)
Alkaline Phosphatase: 84 IU/L (ref 39–117)
BUN/Creatinine Ratio: 33 — ABNORMAL HIGH (ref 12–28)
BUN: 25 mg/dL (ref 8–27)
Bilirubin Total: 0.2 mg/dL (ref 0.0–1.2)
CO2: 27 mmol/L (ref 20–29)
Calcium: 9.8 mg/dL (ref 8.7–10.3)
Chloride: 103 mmol/L (ref 96–106)
Creatinine, Ser: 0.76 mg/dL (ref 0.57–1.00)
GFR calc Af Amer: 91 mL/min/{1.73_m2} (ref >59)
GFR calc non Af Amer: 79 mL/min/{1.73_m2} (ref >59)
Globulin, Total: 2.1 g/dL (ref 1.5–4.5)
Glucose: 82 mg/dL (ref 65–99)
Potassium: 4.1 mmol/L (ref 3.5–5.2)
Sodium: 142 mmol/L (ref 134–144)
Total Protein: 6.2 g/dL (ref 6.0–8.5)

## 2018-01-25 LAB — LIPID PANEL
Chol/HDL Ratio: 3.6 ratio (ref 0.0–4.4)
Cholesterol, Total: 213 mg/dL — ABNORMAL HIGH (ref 100–199)
HDL: 59 mg/dL (ref >39)
LDL Calculated: 137 mg/dL — ABNORMAL HIGH (ref 0–99)
Triglycerides: 84 mg/dL (ref 0–149)
VLDL Cholesterol Cal: 17 mg/dL (ref 5–40)

## 2018-01-25 LAB — CBC WITH DIFFERENTIAL/PLATELET
Basophils Absolute: 0.1 10*3/uL (ref 0.0–0.2)
Basos: 1 %
EOS (ABSOLUTE): 0.1 10*3/uL (ref 0.0–0.4)
Eos: 3 %
Hematocrit: 39.3 % (ref 34.0–46.6)
Hemoglobin: 13.4 g/dL (ref 11.1–15.9)
Immature Grans (Abs): 0 10*3/uL (ref 0.0–0.1)
Immature Granulocytes: 0 %
Lymphocytes Absolute: 1.7 10*3/uL (ref 0.7–3.1)
Lymphs: 41 %
MCH: 30.9 pg (ref 26.6–33.0)
MCHC: 34.1 g/dL (ref 31.5–35.7)
MCV: 91 fL (ref 79–97)
Monocytes Absolute: 0.6 10*3/uL (ref 0.1–0.9)
Monocytes: 15 %
Neutrophils Absolute: 1.7 10*3/uL (ref 1.4–7.0)
Neutrophils: 40 %
Platelets: 270 10*3/uL (ref 150–450)
RBC: 4.34 x10E6/uL (ref 3.77–5.28)
RDW: 14.1 % (ref 12.3–15.4)
WBC: 4.2 10*3/uL (ref 3.4–10.8)

## 2018-01-25 LAB — HEMOGLOBIN A1C
Est. average glucose Bld gHb Est-mCnc: 114 mg/dL
Hgb A1c MFr Bld: 5.6 % (ref 4.8–5.6)

## 2018-01-25 LAB — TSH: TSH: 4.98 u[IU]/mL — ABNORMAL HIGH (ref 0.450–4.500)

## 2018-01-25 MED ORDER — GABAPENTIN 300 MG PO CAPS: 300.0000 mg | ORAL_CAPSULE | Freq: Every day | ORAL | 1 refills | Status: DC

## 2018-01-25 NOTE — Telephone Encounter (Signed)
Patient advised. She states she takes Synthroid regularly. [facility]

## 2018-01-25 NOTE — Telephone Encounter (Signed)
-----   Message from Erasmo Downer, MD sent at 01/25/2018 10:31 AM EDT ----- Normal labs including blood sugar, A1c, kidney function, liver function, electrolytes, blood counts.  Cholesterol is stable from last year.  It is slightly elevated, but in an okay range.  Recommend diet and exercise to keep this from elevating.  Thyroid function, TSH, is slightly elevated.  Wonder if patient has not missed any doses of Synthroid.  If she is taking this regularly, she may need a slightly higher dose to better control her thyroid function.  Erasmo Downer, MD, MPH [facility] 01/25/2018 10:31 AM

## 2018-01-25 NOTE — Telephone Encounter (Signed)
LMTCB

## 2018-01-26 MED ORDER — LEVOTHYROXINE SODIUM 75 MCG PO TABS: 75.0000 ug | ORAL_TABLET | Freq: Every day | ORAL | 5 refills | Status: DC

## 2018-01-26 NOTE — Telephone Encounter (Signed)
New dose of Synthroid, 75 mcg daily, sent to the patient's pharmacy.  TSH should be rechecked in 6 to 8 weeks to see if this is an okay dose.  Erasmo Downer, MD, MPH [facility] 01/26/2018 1:55 PM

## 2018-01-26 NOTE — Telephone Encounter (Signed)
Patient advised. She will call in 6-8 weeks to request a lab slip to recheck TSH.

## 2018-02-17 ENCOUNTER — Ambulatory Visit
Admission: RE | Admit: 2018-02-17 | Discharge: 2018-02-17 | Disposition: A | Discharge status: Home / Self Care | Payer: Medicare HMO | Source: Ambulatory Visit | Attending: Family Medicine | Admitting: Family Medicine

## 2018-02-17 DIAGNOSIS — M810 Age-related osteoporosis without current pathological fracture: Secondary | ICD-10-CM | POA: Insufficient documentation

## 2018-02-17 DIAGNOSIS — M85852 Other specified disorders of bone density and structure, left thigh: Secondary | ICD-10-CM | POA: Diagnosis not present

## 2018-02-17 DIAGNOSIS — Z780 Asymptomatic menopausal state: Secondary | ICD-10-CM | POA: Diagnosis not present

## 2018-02-23 ENCOUNTER — Ambulatory Visit (INDEPENDENT_AMBULATORY_CARE_PROVIDER_SITE_OTHER): Payer: Medicare HMO | Admitting: Family Medicine

## 2018-02-23 ENCOUNTER — Encounter: Payer: Self-pay | Admitting: Family Medicine

## 2018-02-23 VITALS — BP 110/70 | HR 65 | Temp 98.3°F | Resp 16 | Wt 129.4 lb

## 2018-02-23 DIAGNOSIS — R69 Illness, unspecified: Secondary | ICD-10-CM | POA: Diagnosis not present

## 2018-02-23 DIAGNOSIS — F5101 Primary insomnia: Secondary | ICD-10-CM | POA: Diagnosis not present

## 2018-02-23 DIAGNOSIS — N3091 Cystitis, unspecified with hematuria: Secondary | ICD-10-CM

## 2018-02-23 DIAGNOSIS — N3090 Cystitis, unspecified without hematuria: Secondary | ICD-10-CM | POA: Diagnosis not present

## 2018-02-23 LAB — POCT URINALYSIS DIPSTICK
Bilirubin, UA: NEGATIVE
Glucose, UA: NEGATIVE
Ketones, UA: NEGATIVE
Nitrite, UA: POSITIVE
Protein, UA: POSITIVE — AB
Spec Grav, UA: 1.01 (ref 1.010–1.025)
Urobilinogen, UA: 0.2 E.U./dL
pH, UA: 6.5 (ref 5.0–8.0)

## 2018-02-23 MED ORDER — CEPHALEXIN 500 MG PO CAPS: 500.0000 mg | ORAL_CAPSULE | Freq: Two times a day (BID) | ORAL | 0 refills | Status: AC

## 2018-02-23 NOTE — Progress Notes (Signed)
Patient: Sonya Barnes Female    DOB: July 08, 1945   72 y.o.   MRN: [id number] Visit Date: 02/23/2018  Today's Provider: Shirlee Latch, MD   Chief Complaint  Patient presents with  . Insomnia  . Dysuria   Subjective:    Insomnia  Primary symptoms: sleep disturbance, frequent awakening.  The problem occurs nightly. The problem has been gradually improving since onset. The symptoms are aggravated by caffeine. How many beverages per day that contain caffeine: 2-3.  Types of beverages you drink: coffee. Past treatments include medication (gabapentin (which has also helped with anxiety and hot flashes)). The treatment provided significant relief. Typical bedtime:  10-11 P.M..  How long after going to bed to you fall asleep: 30-60 minutes.    Dysuria   This is a new problem. The current episode started yesterday. The problem has been gradually worsening. The quality of the pain is described as stabbing and aching. The pain is moderate. The maximum temperature recorded prior to her arrival was 100 - 100.9 F. The fever has been present for less than 1 day. Associated symptoms include chills, frequency, hematuria, nausea, sweats and urgency. Pertinent negatives include no discharge, flank pain, hesitancy, possible pregnancy or vomiting. She has tried nothing for the symptoms. Her past medical history is significant for kidney stones. There is no history of catheterization or recurrent UTIs.       No Known Allergies   Current Outpatient Medications:  .  alendronate (FOSAMAX) 70 MG tablet, TAKE 1 TABLET BY MOUTH ONCE WEEKLY. TAKE WITH FULL GLASS OF WATER ON AN EMPTY STOMACH, Disp: 12 tablet, Rfl: 2 .  ALPRAZolam (XANAX) 0.5 MG tablet, Take 1 tablet (0.5 mg total) by mouth 2 (two) times daily as needed., Disp: 180 tablet, Rfl: 0 .  Calcium-Vitamin D-Vitamin K (VIACTIV PO), Take by mouth daily., Disp: , Rfl:  .  cetirizine (ZYRTEC) 10 MG tablet, Take 1 tablet by mouth daily., Disp: , Rfl:    .  fluticasone (FLONASE) 50 MCG/ACT nasal spray, Place 2 sprays into the nose daily. 2 sprays each nostril, Disp: , Rfl:  .  gabapentin (NEURONTIN) 300 MG capsule, Take 1 capsule (300 mg total) by mouth at bedtime., Disp: 30 capsule, Rfl: 1 .  levothyroxine (SYNTHROID, LEVOTHROID) 75 MCG tablet, Take 1 tablet (75 mcg total) by mouth daily., Disp: 30 tablet, Rfl: 5 .  Multiple Vitamin (MULTIVITAMIN) tablet, Take 1 tablet by mouth daily., Disp: , Rfl:  .  omeprazole (PRILOSEC) 40 MG capsule, TAKE 1 CAPSULE (40 MG TOTAL) BY MOUTH DAILY., Disp: 90 capsule, Rfl: 4 .  Polyethyl Glycol-Propyl Glycol (SYSTANE ULTRA OP), Apply to eye., Disp: , Rfl:  .  ranitidine (ZANTAC) 150 MG tablet, Take 1 tablet by mouth daily. As needed, Disp: , Rfl:   Review of Systems  Constitutional: Positive for chills.  Gastrointestinal: Positive for nausea. Negative for vomiting.  Genitourinary: Positive for dysuria, frequency, hematuria and urgency. Negative for flank pain and hesitancy.  Psychiatric/Behavioral: Positive for sleep disturbance. The patient has insomnia.     Social History   Tobacco Use  . Smoking status: Never Smoker  . Smokeless tobacco: Never Used  Substance Use Topics  . Alcohol use: No    Alcohol/week: 0.0 standard drinks   Objective:   BP 110/70   Pulse 65   Temp 98.3 F (36.8 C) (Oral)   Resp 16   Wt 129 lb 6.4 oz (58.7 kg)   SpO2 97%   BMI  21.53 kg/m  Vitals:   02/23/18 1436  BP: 110/70  Pulse: 65  Resp: 16  Temp: 98.3 F (36.8 C)  TempSrc: Oral  SpO2: 97%  Weight: 129 lb 6.4 oz (58.7 kg)    Physical Exam  Constitutional: She is oriented to person, place, and time. She appears well-developed and well-nourished. No distress.  HENT:  Head: Normocephalic and atraumatic.  Eyes: Conjunctivae are normal. No scleral icterus.  Neck: Neck supple. No thyromegaly present.  Cardiovascular: Normal rate, regular rhythm, normal heart sounds and intact distal pulses.  No murmur  heard. Pulmonary/Chest: Effort normal and breath sounds normal. No respiratory distress. She has no wheezes. She has no rales.  Abdominal: Soft. There is tenderness in the periumbilical area and suprapubic area. There is no rebound, no guarding and no CVA tenderness.  Musculoskeletal: She exhibits no edema.  Lymphadenopathy:    She has no cervical adenopathy.  Neurological: She is alert and oriented to person, place, and time.  Skin: Skin is warm and dry. Capillary refill takes less than 2 seconds. No rash noted.  Psychiatric: She has a normal mood and affect. Her behavior is normal.  Vitals reviewed.    Results for orders placed or performed in visit on 02/23/18  POCT urinalysis dipstick  Result Value Ref Range   Color, UA yellow    Clarity, UA cloudy    Glucose, UA Negative Negative   Bilirubin, UA negative    Ketones, UA negative    Spec Grav, UA 1.010 1.010 - 1.025   Blood, UA large    pH, UA 6.5 5.0 - 8.0   Protein, UA Positive (A) Negative   Urobilinogen, UA 0.2 0.2 or 1.0 E.U./dL   Nitrite, UA positive    Leukocytes, UA Moderate (2+) (A) Negative   Appearance     Odor         Assessment & Plan:   Problem List Items Addressed This Visit      Other   Cannot sleep    Chronic Improving with Gabapentin, which is also helping vasomotor symptoms of menopause and anxiety continue gabapentin 300 mg nightly Discussed that we could go to a higher dose in the future if it seems to be waning in efficacy Follow-up in 5 months       Other Visit Diagnoses    Cystitis with hematuria    -  Primary   Relevant Orders   POCT urinalysis dipstick (Completed)   Urine Culture    -UA and symptoms consistent with UTI -No signs of pyelonephritis nephrolithiasis - Urinalysis in about 6 weeks to ensure hematuria clears which is likely caused by UTI - Start treatment with Keflex twice daily x5 days -Send urine culture to confirm sensitivities -discussed return precautions   Return  in about 5 months (around 07/25/2018) for insomnia and chronic disease f/u.   The entirety of the information documented in the History of Present Illness, Review of Systems and Physical Exam were personally obtained by me. Portions of this information were initially documented by Nadene Rubins, CMA and reviewed by me for thoroughness and accuracy.    Erasmo Downer, MD, MPH [facility] 02/23/2018 3:04 PM

## 2018-02-23 NOTE — Assessment & Plan Note (Signed)
Chronic Improving with Gabapentin, which is also helping vasomotor symptoms of menopause and anxiety continue gabapentin 300 mg nightly Discussed that we could go to a higher dose in the future if it seems to be waning in efficacy Follow-up in 5 months

## 2018-02-23 NOTE — Patient Instructions (Signed)

## 2018-02-25 ENCOUNTER — Telehealth: Payer: Self-pay

## 2018-02-25 LAB — URINE CULTURE

## 2018-02-25 NOTE — Telephone Encounter (Signed)
Patient advised.KW 

## 2018-02-25 NOTE — Telephone Encounter (Signed)
-----   Message from Erasmo Downer, MD sent at 02/25/2018  4:18 PM EDT ----- Urine culture confirms UTI.  This was caused by E. coli.  This is sensitive to the antibiotics that were prescribed.  Erasmo Downer, MD, MPH [facility] 02/25/2018 4:18 PM

## 2018-03-04 ENCOUNTER — Telehealth: Payer: Self-pay | Admitting: Family Medicine

## 2018-03-04 MED ORDER — AMOXICILLIN-POT CLAVULANATE 500-125 MG PO TABS: 1.0000 | ORAL_TABLET | Freq: Two times a day (BID) | ORAL | 0 refills | Status: AC

## 2018-03-04 NOTE — Telephone Encounter (Signed)
Actually, I did go ahead and send the prescription myself.  Please let the patient of the other information, however.  Erasmo Downer, MD, MPH [facility] 03/04/2018 5:01 PM

## 2018-03-04 NOTE — Telephone Encounter (Signed)
Urine culture did show that the UTI was sensitive to the Keflex as prescribed.  It was resistant to several antibiotics, however.  We will try treatment with Augmentin.  Okay to send a prescription for Augmentin 500 mg twice daily x5 days.  If this recurs again or does not get better with the second round of antibiotics, she will need a follow-up visit so we can reevaluate.  Erasmo Downer, MD, MPH [facility] 03/04/2018 4:59 PM

## 2018-03-04 NOTE — Telephone Encounter (Signed)
Called saying she just finished the antibiotic for an URTI today and is already burning again when she urinates  [facility] [address]  CB#  [phone]  Thanks [employer]

## 2018-03-07 NOTE — Telephone Encounter (Signed)
Patient advised. She verbalized understanding.  

## 2018-03-24 ENCOUNTER — Other Ambulatory Visit: Payer: Self-pay | Admitting: Family Medicine

## 2018-04-26 ENCOUNTER — Other Ambulatory Visit: Payer: Self-pay | Admitting: Family Medicine

## 2018-06-20 DIAGNOSIS — R69 Illness, unspecified: Secondary | ICD-10-CM | POA: Diagnosis not present

## 2018-06-24 ENCOUNTER — Other Ambulatory Visit: Payer: Self-pay | Admitting: Family Medicine

## 2018-07-07 ENCOUNTER — Other Ambulatory Visit: Payer: Self-pay | Admitting: Family Medicine

## 2018-07-23 ENCOUNTER — Other Ambulatory Visit: Payer: Self-pay | Admitting: Family Medicine

## 2018-07-23 ENCOUNTER — Encounter: Payer: Self-pay | Admitting: Family Medicine

## 2018-07-23 NOTE — Telephone Encounter (Signed)
Patient is requesting a refill on the following medication  levothyroxine (SYNTHROID, LEVOTHROID) 75 MCG tablet  She will be out on Tuesday.  [facility] [address].

## 2018-07-25 ENCOUNTER — Encounter: Payer: Self-pay | Admitting: Family Medicine

## 2018-07-25 ENCOUNTER — Ambulatory Visit (INDEPENDENT_AMBULATORY_CARE_PROVIDER_SITE_OTHER): Payer: Medicare HMO | Admitting: Family Medicine

## 2018-07-25 ENCOUNTER — Other Ambulatory Visit: Payer: Self-pay | Admitting: Family Medicine

## 2018-07-25 VITALS — BP 119/66 | HR 55 | Temp 97.7°F | Wt 133.0 lb

## 2018-07-25 DIAGNOSIS — K219 Gastro-esophageal reflux disease without esophagitis: Secondary | ICD-10-CM | POA: Diagnosis not present

## 2018-07-25 DIAGNOSIS — R69 Illness, unspecified: Secondary | ICD-10-CM | POA: Diagnosis not present

## 2018-07-25 DIAGNOSIS — F5101 Primary insomnia: Secondary | ICD-10-CM | POA: Diagnosis not present

## 2018-07-25 DIAGNOSIS — R7303 Prediabetes: Secondary | ICD-10-CM

## 2018-07-25 DIAGNOSIS — E890 Postprocedural hypothyroidism: Secondary | ICD-10-CM | POA: Diagnosis not present

## 2018-07-25 DIAGNOSIS — M810 Age-related osteoporosis without current pathological fracture: Secondary | ICD-10-CM

## 2018-07-25 DIAGNOSIS — E7800 Pure hypercholesterolemia, unspecified: Secondary | ICD-10-CM

## 2018-07-25 DIAGNOSIS — F419 Anxiety disorder, unspecified: Secondary | ICD-10-CM

## 2018-07-25 MED ORDER — SERTRALINE HCL 50 MG PO TABS: 50.0000 mg | ORAL_TABLET | Freq: Every day | ORAL | 5 refills | Status: DC

## 2018-07-25 NOTE — Patient Instructions (Signed)

## 2018-07-25 NOTE — Progress Notes (Addendum)
Patient: Sonya Barnes Female    DOB: 12-Oct-1945   73 y.o.   MRN: [id number] Visit Date: 07/25/2018  Today's Provider: Shirlee Latch, MD   Chief Complaint  Patient presents with  . Hypothyroidism  . Hyperlipidemia  . Insomnia   Subjective:    I, Presley Raddle, CMA, am acting as a scribe for Shirlee Latch, MD.   HPI  Hypothyroid, follow-up:  TSH  Date Value Ref Range Status  01/24/2018 4.980 (H) 0.450 - 4.500 uIU/mL Final  01/11/2017 2.665 0.350 - 4.500 uIU/mL Final    Comment:    Performed by a 3rd Generation assay with a functional sensitivity of <=0.01 uIU/mL.  01/07/2016 3.285 0.350 - 4.500 uIU/mL Final   Wt Readings from Last 3 Encounters:  07/25/18 133 lb (60.3 kg)  02/23/18 129 lb 6.4 oz (58.7 kg)  01/21/18 130 lb 12.8 oz (59.3 kg)    She was last seen for hypothyroid 5 months ago.  Management since that visit includes no change. She reports good compliance with treatment. She is not having side effects.  She is exercising. She is experiencing none She denies change in energy level, diarrhea, heat / cold intolerance, nervousness, palpitations and weight changes Weight trend: stable  ------------------------------------------------------------------------  Lipid/Cholesterol, Follow-up:   Last seen for this5 months ago.  Management changes since that visit include no change. . Last Lipid Panel:    Component Value Date/Time   CHOL 213 (H) 01/24/2018 0802   CHOL 205 (H) 02/01/2014 1228   TRIG 84 01/24/2018 0802   TRIG 160 02/01/2014 1228   HDL 59 01/24/2018 0802   HDL 56 02/01/2014 1228   CHOLHDL 3.6 01/24/2018 0802   CHOLHDL 3.9 01/11/2017 1320   VLDL 20 01/11/2017 1320   VLDL 32 02/01/2014 1228   LDLCALC 137 (H) 01/24/2018 0802   LDLCALC 117 (H) 02/01/2014 1228    Risk factors for vascular disease include hypercholesterolemia  She reports good compliance with treatment. She is not having side effects.  Current symptoms include none    Weight trend: stable Current diet: in general, a "healthy" diet   Current exercise: walking  Wt Readings from Last 3 Encounters:  07/25/18 133 lb (60.3 kg)  02/23/18 129 lb 6.4 oz (58.7 kg)  01/21/18 130 lb 12.8 oz (59.3 kg)    -------------------------------------------------------------------  Insomnia Follow up  She presents today for follow up of insomnia. She was last seen for this 5 months ago. Management changes included started Gabapentin 300 mg. She is not having adverse reaction from treatment.  Insomnia is getting slightly better. She reports she still has nights that she doesn't sleep well. She has difficulty FALLING asleep. She has difficulty STAYING asleep.  She is not taking stimulant medications.  She is not taking new medications:  She is not taking OTC sleeping aid. . She is taking medications to help sleep. She is not drinking alcohol to help sleep. She is not using illicit drugs.  Is having some nights were everything bothers her and everything worries her. Finds herself ruminating on things.   --------------------------------------------------------------------   No Known Allergies   Current Outpatient Medications:  .  alendronate (FOSAMAX) 70 MG tablet, TAKE 1 TABLET BY MOUTH ONCE WEEKLY. TAKE WITH FULL GLASS OF WATER ON AN EMPTY STOMACH, Disp: 12 tablet, Rfl: 4 .  ALPRAZolam (XANAX) 0.5 MG tablet, Take 1 tablet (0.5 mg total) by mouth 2 (two) times daily as needed., Disp: 180 tablet, Rfl: 0 .  Calcium-Vitamin D-Vitamin  K (VIACTIV PO), Take by mouth daily., Disp: , Rfl:  .  cetirizine (ZYRTEC) 10 MG tablet, Take 1 tablet by mouth daily., Disp: , Rfl:  .  fluticasone (FLONASE) 50 MCG/ACT nasal spray, Place 2 sprays into the nose daily. 2 sprays each nostril, Disp: , Rfl:  .  gabapentin (NEURONTIN) 300 MG capsule, TAKE 1 CAPSULE BY MOUTH AT BEDTIME, Disp: 30 capsule, Rfl: 0 .  levothyroxine (SYNTHROID, LEVOTHROID) 75 MCG tablet, TAKE 1 TABLET BY MOUTH  EVERY DAY, Disp: 90 tablet, Rfl: 1 .  Multiple Vitamin (MULTIVITAMIN) tablet, Take 1 tablet by mouth daily., Disp: , Rfl:  .  omeprazole (PRILOSEC) 40 MG capsule, TAKE 1 CAPSULE (40 MG TOTAL) BY MOUTH DAILY., Disp: 90 capsule, Rfl: 4 .  Polyethyl Glycol-Propyl Glycol (SYSTANE ULTRA OP), Apply to eye., Disp: , Rfl:  .  ranitidine (ZANTAC) 150 MG tablet, Take 1 tablet by mouth daily. As needed, Disp: , Rfl:   Review of Systems  Constitutional: Negative.   Respiratory: Negative.   Cardiovascular: Negative.   Endocrine: Negative.   Musculoskeletal: Negative.     Social History   Tobacco Use  . Smoking status: Never Smoker  . Smokeless tobacco: Never Used  Substance Use Topics  . Alcohol use: No    Alcohol/week: 0.0 standard drinks      Objective:   BP 119/66 (BP Location: Right Arm, Patient Position: Sitting, Cuff Size: Normal)   Pulse (!) 55   Temp 97.7 F (36.5 C) (Oral)   Wt 133 lb (60.3 kg)   SpO2 98%   BMI 22.13 kg/m  Vitals:   07/25/18 1342  BP: 119/66  Pulse: (!) 55  Temp: 97.7 F (36.5 C)  TempSrc: Oral  SpO2: 98%  Weight: 133 lb (60.3 kg)     Physical Exam Vitals signs reviewed.  Constitutional:      General: She is not in acute distress.    Appearance: Normal appearance. She is well-developed. She is not diaphoretic.  HENT:     Head: Normocephalic and atraumatic.  Eyes:     General: No scleral icterus.    Conjunctiva/sclera: Conjunctivae normal.  Neck:     Musculoskeletal: Neck supple.     Thyroid: No thyromegaly.  Cardiovascular:     Rate and Rhythm: Normal rate and regular rhythm.     Pulses: Normal pulses.     Heart sounds: Normal heart sounds. No murmur.  Pulmonary:     Effort: Pulmonary effort is normal. No respiratory distress.     Breath sounds: Normal breath sounds. No wheezing, rhonchi or rales.  Musculoskeletal:     Right lower leg: No edema.     Left lower leg: No edema.  Lymphadenopathy:     Cervical: No cervical adenopathy.    Skin:    General: Skin is warm and dry.     Capillary Refill: Capillary refill takes less than 2 seconds.     Findings: No rash.  Neurological:     Mental Status: She is alert and oriented to person, place, and time. Mental status is at baseline.  Psychiatric:        Mood and Affect: Affect normal. Mood is anxious.        Speech: Speech normal.        Behavior: Behavior normal.        Thought Content: Thought content does not include homicidal or suicidal ideation.         Assessment & Plan   Problem List Items Addressed This  Visit      Digestive   Acid reflux    Well-controlled Continue PPI        Endocrine   Hypothyroidism associated with surgical procedure - Primary    TSH was elevated at last check and Synthroid dose was adjusted Patient is asymptomatic Continue Synthroid at current dose Recheck TSH and change Synthroid dose as indicated by results      Relevant Orders   TSH (Completed)     Musculoskeletal and Integument   Osteoporosis    Doing well on Fosamax which was started in 12/2015 Last DEXA scan shows improvement in bone density Continue Fosamax        Other   Anxiety    Longstanding issue She has weaned down on her Xanax use Suspect this is contributing significantly to her insomnia She agrees to start SSRI therapy We will start low-dose Zoloft at 50 mg daily Discussed possible side effects Discussed return precautions      Relevant Medications   sertraline (ZOLOFT) 50 MG tablet   Hypercholesteremia    Discussed last lipid panel with patient      Prediabetes    Discussed diet and exercise Recheck A1c      Relevant Orders   Hemoglobin A1c (Completed)   Cannot sleep    Chronic Improved some with gabapentin, which is also helping her vasomotor symptoms of menopause and anxiety Continue gabapentin 300 mg nightly We discussed that anxiety is likely also contributing to her lack of sleep She agrees to start SSRI as above Start Zoloft  50 mg daily          Return in about 3 months (around 10/25/2018) for insomnia/anxiety f/u.   The entirety of the information documented in the History of Present Illness, Review of Systems and Physical Exam were personally obtained by me. Portions of this information were initially documented by Presley Raddle, CMA and reviewed by me for thoroughness and accuracy.    Erasmo Downer, MD, MPH [facility] 07/28/2018 11:54 AM

## 2018-07-25 NOTE — Telephone Encounter (Signed)
RX sent. Patient has a follow up scheduled today.

## 2018-07-25 NOTE — Telephone Encounter (Signed)
OK to refill. Patient needs to come by and have TSH rechecked this week.

## 2018-07-26 LAB — TSH: TSH: 1.07 u[IU]/mL (ref 0.450–4.500)

## 2018-07-26 LAB — HEMOGLOBIN A1C
Est. average glucose Bld gHb Est-mCnc: 111 mg/dL
Hgb A1c MFr Bld: 5.5 % (ref 4.8–5.6)

## 2018-07-27 ENCOUNTER — Encounter: Payer: Self-pay | Admitting: Family Medicine

## 2018-07-28 NOTE — Assessment & Plan Note (Signed)
Longstanding issue She has weaned down on her Xanax use Suspect this is contributing significantly to her insomnia She agrees to start SSRI therapy We will start low-dose Zoloft at 50 mg daily Discussed possible side effects Discussed return precautions

## 2018-07-28 NOTE — Assessment & Plan Note (Signed)
Discussed last lipid panel with patient

## 2018-07-28 NOTE — Assessment & Plan Note (Signed)
Doing well on Fosamax which was started in 12/2015 Last DEXA scan shows improvement in bone density Continue Fosamax 

## 2018-07-28 NOTE — Assessment & Plan Note (Signed)
Well controlled Continue PPI 

## 2018-07-28 NOTE — Assessment & Plan Note (Signed)
Discussed diet and exercise Recheck A1c 

## 2018-07-28 NOTE — Assessment & Plan Note (Signed)
TSH was elevated at last check and Synthroid dose was adjusted Patient is asymptomatic Continue Synthroid at current dose Recheck TSH and change Synthroid dose as indicated by results

## 2018-07-28 NOTE — Assessment & Plan Note (Signed)
Chronic Improved some with gabapentin, which is also helping her vasomotor symptoms of menopause and anxiety Continue gabapentin 300 mg nightly We discussed that anxiety is likely also contributing to her lack of sleep She agrees to start SSRI as above Start Zoloft 50 mg daily

## 2018-08-16 ENCOUNTER — Other Ambulatory Visit: Payer: Self-pay | Admitting: Family Medicine

## 2018-08-16 NOTE — Telephone Encounter (Signed)
Please Review

## 2018-08-22 ENCOUNTER — Other Ambulatory Visit: Payer: Self-pay | Admitting: Physician Assistant

## 2018-09-28 ENCOUNTER — Telehealth: Payer: Self-pay | Admitting: Family Medicine

## 2018-09-28 NOTE — Telephone Encounter (Signed)
Pt called saying she has been having nausea for 6 to 8 weeks that comes and goes.  CB#  [phone]  Thanks [employer]

## 2018-09-29 NOTE — Telephone Encounter (Signed)
Patient scheduled for a e-visit on 09/30/2018

## 2018-09-29 NOTE — Telephone Encounter (Signed)
Seems appropriate for evisit

## 2018-09-30 ENCOUNTER — Ambulatory Visit (INDEPENDENT_AMBULATORY_CARE_PROVIDER_SITE_OTHER): Payer: Medicare HMO | Admitting: Family Medicine

## 2018-09-30 ENCOUNTER — Encounter: Payer: Self-pay | Admitting: Family Medicine

## 2018-09-30 DIAGNOSIS — R1013 Epigastric pain: Secondary | ICD-10-CM

## 2018-09-30 DIAGNOSIS — R110 Nausea: Secondary | ICD-10-CM

## 2018-09-30 MED ORDER — ONDANSETRON HCL 4 MG PO TABS: 4.0000 mg | ORAL_TABLET | Freq: Three times a day (TID) | ORAL | 0 refills | Status: DC | PRN

## 2018-09-30 NOTE — Patient Instructions (Signed)
Gallbladder Eating Plan If you have a gallbladder condition, you may have trouble digesting fats. Eating a low-fat diet can help reduce your symptoms, and may be helpful before and after having surgery to remove your gallbladder (cholecystectomy). Your health care provider may recommend that you work with a diet and nutrition specialist (dietitian) to help you reduce the amount of fat in your diet. What are tips for following this plan? General guidelines  Limit your fat intake to less than 30% of your total daily calories. If you eat around 1,800 calories each day, this is less than 60 grams (g) of fat per day.  Fat is an important part of a healthy diet. Eating a low-fat diet can make it hard to maintain a healthy body weight. Ask your dietitian how much fat, calories, and other nutrients you need each day.  Eat small, frequent meals throughout the day instead of three large meals.  Drink at least 8-10 cups of fluid a day. Drink enough fluid to keep your urine clear or pale yellow.  Limit alcohol intake to no more than 1 drink a day for nonpregnant women and 2 drinks a day for men. One drink equals 12 oz of beer, 5 oz of wine, or 1 oz of hard liquor. Reading food labels  Check Nutrition Facts on food labels for the amount of fat per serving. Choose foods with less than 3 grams of fat per serving. Shopping  Choose nonfat and low-fat healthy foods. Look for the words "nonfat," "low fat," or "fat free."  Avoid buying processed or prepackaged foods. Cooking  Cook using low-fat methods, such as baking, broiling, grilling, or boiling.  Cook with small amounts of healthy fats, such as olive oil, grapeseed oil, canola oil, or sunflower oil. What foods are recommended?   All fresh, frozen, or canned fruits and vegetables.  Whole grains.  Low-fat or non-fat (skim) milk and yogurt.  Lean meat, skinless poultry, fish, eggs, and beans.  Low-fat protein supplement powders or  drinks.  Spices and herbs. What foods are not recommended?  High-fat foods. These include baked goods, fast food, fatty cuts of meat, ice cream, french toast, sweet rolls, pizza, cheese bread, foods covered with butter, creamy sauces, or cheese.  Fried foods. These include french fries, tempura, battered fish, breaded chicken, fried breads, and sweets.  Foods with strong odors.  Foods that cause bloating and gas. Summary  A low-fat diet can be helpful if you have a gallbladder condition, or before and after gallbladder surgery.  Limit your fat intake to less than 30% of your total daily calories. This is about 60 g of fat if you eat 1,800 calories each day.  Eat small, frequent meals throughout the day instead of three large meals. This information is not intended to replace advice given to you by your health care provider. Make sure you discuss any questions you have with your health care provider. Document Released: 05/09/2013 Document Revised: 06/11/2016 Document Reviewed: 06/11/2016 Elsevier Interactive Patient Education  2019 Elsevier Inc.  

## 2018-09-30 NOTE — Progress Notes (Signed)
Patient: Sonya Barnes Female    DOB: 30-Sep-1945   73 y.o.   MRN: [id number] Visit Date: 09/30/2018  Today's Provider: Lavon Paganini, MD   Chief Complaint  Patient presents with  . Nausea   Subjective:    Virtual Visit via Video Note  I connected with Sonya Barnes on 09/30/18 at 10:40 AM EDT by a video enabled telemedicine application and verified that I am speaking with the correct person using two identifiers.   Patient location: home Provider location: home office  Persons involved in the visit: patient, provider    I discussed the limitations of evaluation and management by telemedicine and the availability of in person appointments. The patient expressed understanding and agreed to proceed.   GI Problem  The primary symptoms include nausea. Progression since onset: waxing and waning.  Onset: 6-8 weeks ago. Associated with: started Sertraline then discontinued after a few days. Nausea started a few weeks after stopping medication.   Patient initially believed that nausea was caused by Zoloft, but she has been off of that for several weeks and it seems to be worse.  She denies any pain, but does report epigastric/RUQ discomfort.  This lasts for a few hours after eating and is worse with high fat foods, like fried seafood and ice cream.  She is trying to avoid food triggers.   No Known Allergies   Current Outpatient Medications:  .  alendronate (FOSAMAX) 70 MG tablet, TAKE 1 TABLET BY MOUTH ONCE WEEKLY. TAKE WITH FULL GLASS OF WATER ON AN EMPTY STOMACH, Disp: 12 tablet, Rfl: 4 .  ALPRAZolam (XANAX) 0.5 MG tablet, Take 1 tablet (0.5 mg total) by mouth 2 (two) times daily as needed., Disp: 180 tablet, Rfl: 0 .  Calcium-Vitamin D-Vitamin K (VIACTIV PO), Take by mouth daily., Disp: , Rfl:  .  cetirizine (ZYRTEC) 10 MG tablet, Take 1 tablet by mouth daily., Disp: , Rfl:  .  famotidine (PEPCID) 20 MG tablet, Take 20 mg by mouth 2 (two) times daily., Disp: , Rfl:  .   fluticasone (FLONASE) 50 MCG/ACT nasal spray, Place 2 sprays into the nose daily. 2 sprays each nostril, Disp: , Rfl:  .  gabapentin (NEURONTIN) 300 MG capsule, TAKE 1 CAPSULE AT BEDTIME, Disp: 90 capsule, Rfl: 0 .  levothyroxine (SYNTHROID, LEVOTHROID) 75 MCG tablet, TAKE 1 TABLET BY MOUTH EVERY DAY, Disp: 90 tablet, Rfl: 1 .  Multiple Vitamin (MULTIVITAMIN) tablet, Take 1 tablet by mouth daily., Disp: , Rfl:  .  omeprazole (PRILOSEC) 40 MG capsule, TAKE 1 CAPSULE (40 MG TOTAL) BY MOUTH DAILY., Disp: 90 capsule, Rfl: 4 .  Polyethyl Glycol-Propyl Glycol (SYSTANE ULTRA OP), Apply to eye., Disp: , Rfl:  .  sertraline (ZOLOFT) 50 MG tablet, TAKE 1 TABLET BY MOUTH EVERY DAY (Patient not taking: Reported on 09/30/2018), Disp: 90 tablet, Rfl: 2  Review of Systems  Constitutional: Negative.   Respiratory: Negative.   Cardiovascular: Negative.   Gastrointestinal: Positive for nausea.    Social History   Tobacco Use  . Smoking status: Never Smoker  . Smokeless tobacco: Never Used  Substance Use Topics  . Alcohol use: No    Alcohol/week: 0.0 standard drinks      Objective:   There were no vitals taken for this visit. There were no vitals filed for this visit.   Physical Exam Constitutional:      Appearance: Normal appearance.  Pulmonary:     Effort: Pulmonary effort is normal. No  respiratory distress.  Neurological:     Mental Status: She is alert and oriented to person, place, and time. Mental status is at baseline.  Psychiatric:        Mood and Affect: Mood normal.        Behavior: Behavior normal.         Assessment & Plan    I discussed the assessment and treatment plan with the patient. The patient was provided an opportunity to ask questions and all were answered. The patient agreed with the plan and demonstrated an understanding of the instructions.   The patient was advised to call back or seek an in-person evaluation if the symptoms worsen or if the condition fails to  improve as anticipated.  1. Nausea 2. Epigastric abdominal pain - new problem - seems to occur after eating and worse with fatty foods - reassured patient that this is unlikely to be related to Zoloft as this was discontinued several weeks ago - concern for possible cholelithiasis/biliary colic - will get RUQ [place] and may need to consider HIDA scan - discussed with patient that if this is a gallbladder pathology, would refer to surgery for furthe management - can use Zofran prn for nausea - avoid high fat foods - US Abdomen Limited RUQ; Future    Meds ordered this encounter  Medications  . ondansetron (ZOFRAN) 4 MG tablet    Sig: Take 1 tablet (4 mg total) by mouth every 8 (eight) hours as needed for nausea or vomiting.    Dispense:  60 tablet    Refill:  0     Return if symptoms worsen or fail to improve.   The entirety of the information documented in the History of Present Illness, Review of Systems and Physical Exam were personally obtained by me. Portions of this information were initially documented by Tiburcio Pea, CMA and reviewed by me for thoroughness and accuracy.    Ismael Treptow, Dionne Bucy, MD MPH [facility]

## 2018-10-11 ENCOUNTER — Other Ambulatory Visit: Payer: Self-pay

## 2018-10-11 ENCOUNTER — Ambulatory Visit
Admission: RE | Admit: 2018-10-11 | Discharge: 2018-10-11 | Disposition: A | Discharge status: Home / Self Care | Payer: Medicare HMO | Source: Ambulatory Visit | Attending: Family Medicine | Admitting: Family Medicine

## 2018-10-11 DIAGNOSIS — K828 Other specified diseases of gallbladder: Secondary | ICD-10-CM | POA: Diagnosis not present

## 2018-10-11 DIAGNOSIS — R1013 Epigastric pain: Secondary | ICD-10-CM

## 2018-10-11 DIAGNOSIS — R110 Nausea: Secondary | ICD-10-CM

## 2018-10-12 ENCOUNTER — Telehealth: Payer: Self-pay | Admitting: *Deleted

## 2018-10-12 DIAGNOSIS — R112 Nausea with vomiting, unspecified: Secondary | ICD-10-CM

## 2018-10-12 DIAGNOSIS — R1011 Right upper quadrant pain: Secondary | ICD-10-CM

## 2018-10-12 NOTE — Telephone Encounter (Signed)
Patient was notified of results. Expressed understanding. Patient states she is still having symptoms. However they are more sporadic now, but when they occur they are very uncomfortable. Patient does not want to do the scan until she has more information. Patient wants to know what the scan would entail? Patient states if she needs to do another e-visit to discuss this let her know. Please advise?

## 2018-10-12 NOTE — Telephone Encounter (Signed)
-----   Message from Erasmo Downer, MD sent at 10/12/2018 10:26 AM EDT ----- No definite gallstones noted, but there is sludge in the gallbladder.  Recommend HIDA scan to see function of gallbladder next if still having symptoms.

## 2018-10-12 NOTE — Telephone Encounter (Signed)
Patient has tried famotidine that helped some with nausea and discomfort.  Pain is much better than it was and now more sporadic.  Did notice more discomfort, bloating, and nausea after eating fried chicken a few days ago.  Has really altered diet to avoid symptoms.  Discussed meaning and procedure of HIDA scan and patient agrees to proceed.  She was hopeful that [place] would have results and she wouldn't need any further testing.

## 2018-10-25 ENCOUNTER — Other Ambulatory Visit: Payer: Self-pay

## 2018-10-25 ENCOUNTER — Encounter
Admission: RE | Admit: 2018-10-25 | Discharge: 2018-10-25 | Disposition: A | Discharge status: Home / Self Care | Payer: Medicare HMO | Source: Ambulatory Visit | Attending: Family Medicine | Admitting: Family Medicine

## 2018-10-25 DIAGNOSIS — R109 Unspecified abdominal pain: Secondary | ICD-10-CM | POA: Diagnosis not present

## 2018-10-25 DIAGNOSIS — R112 Nausea with vomiting, unspecified: Secondary | ICD-10-CM | POA: Diagnosis not present

## 2018-10-25 DIAGNOSIS — R1011 Right upper quadrant pain: Secondary | ICD-10-CM | POA: Insufficient documentation

## 2018-10-25 DIAGNOSIS — R140 Abdominal distension (gaseous): Secondary | ICD-10-CM | POA: Diagnosis not present

## 2018-10-25 MED ORDER — TECHNETIUM TC 99M MEBROFENIN IV KIT
5.0000 | PACK | Freq: Once | INTRAVENOUS | Status: AC | PRN
  Administered 2018-10-25: 4.94 via INTRAVENOUS

## 2018-10-26 ENCOUNTER — Telehealth: Payer: Self-pay

## 2018-10-26 ENCOUNTER — Ambulatory Visit: Payer: Medicare HMO | Admitting: Family Medicine

## 2018-10-26 NOTE — Telephone Encounter (Signed)
Patient advised. Patient states she will contact current GI provider

## 2018-10-26 NOTE — Telephone Encounter (Signed)
-----   Message from Virginia Crews, MD sent at 10/26/2018 11:38 AM EDT ----- Normal functioning gallbladder.  Still having nausea and pain?  Could consider GI referral?

## 2018-11-23 ENCOUNTER — Other Ambulatory Visit: Payer: Self-pay

## 2018-11-23 MED ORDER — GABAPENTIN 300 MG PO CAPS: 300.0000 mg | ORAL_CAPSULE | Freq: Every day | ORAL | 0 refills | Status: DC

## 2018-12-12 ENCOUNTER — Other Ambulatory Visit: Payer: Self-pay | Admitting: Family Medicine

## 2018-12-12 DIAGNOSIS — Z1231 Encounter for screening mammogram for malignant neoplasm of breast: Secondary | ICD-10-CM

## 2019-01-02 DIAGNOSIS — R69 Illness, unspecified: Secondary | ICD-10-CM | POA: Diagnosis not present

## 2019-01-04 ENCOUNTER — Other Ambulatory Visit: Payer: Self-pay

## 2019-01-04 ENCOUNTER — Telehealth (INDEPENDENT_AMBULATORY_CARE_PROVIDER_SITE_OTHER): Payer: Medicare HMO | Admitting: Family Medicine

## 2019-01-04 DIAGNOSIS — R51 Headache: Secondary | ICD-10-CM | POA: Diagnosis not present

## 2019-01-04 DIAGNOSIS — K0889 Other specified disorders of teeth and supporting structures: Secondary | ICD-10-CM

## 2019-01-04 DIAGNOSIS — R6889 Other general symptoms and signs: Secondary | ICD-10-CM | POA: Diagnosis not present

## 2019-01-04 DIAGNOSIS — R519 Headache, unspecified: Secondary | ICD-10-CM

## 2019-01-04 DIAGNOSIS — R05 Cough: Secondary | ICD-10-CM | POA: Diagnosis not present

## 2019-01-04 DIAGNOSIS — R059 Cough, unspecified: Secondary | ICD-10-CM

## 2019-01-04 DIAGNOSIS — R0981 Nasal congestion: Secondary | ICD-10-CM

## 2019-01-04 DIAGNOSIS — Z20822 Contact with and (suspected) exposure to covid-19: Secondary | ICD-10-CM

## 2019-01-04 MED ORDER — AMOXICILLIN-POT CLAVULANATE 875-125 MG PO TABS: 1.0000 | ORAL_TABLET | Freq: Two times a day (BID) | ORAL | 0 refills | Status: AC

## 2019-01-04 NOTE — Progress Notes (Signed)
Patient: Sonya Barnes Female    DOB: 1946-01-27   73 y.o.   MRN: [id number] Visit Date: 01/04/2019  Today's Provider: Lavon Paganini, MD   Chief Complaint  Patient presents with  . URI  . Dental Problem   Subjective:    I, Sonya Barnes CMA, am acting as a scribe for Lavon Paganini, MD.   Virtual Visit via Video Note  I connected with Sonya Barnes on 01/04/19 at  1:20 PM EDT by a video enabled telemedicine application and verified that I am speaking with the correct person using two identifiers.   Patient location: home Provider location: [place] involved in the visit: patient, provider   I discussed the limitations of evaluation and management by telemedicine and the availability of in person appointments. The patient expressed understanding and agreed to proceed.  URI  The current episode started in the past 7 days. There has been no fever. Associated symptoms include congestion, coughing, headaches and sinus pain. Pertinent negatives include no wheezing.  Dental Pain  The current episode started 1 to 4 weeks ago. The problem has been gradually worsening. The pain is at a severity of 8/10. The pain is severe. Associated symptoms include sinus pressure. Pertinent negatives include no fever or oral bleeding.   Off and on with sinus pressure throughout the summer. Taking Zyrtec nightly and continues to have hoarseness and rhinorrhea despite this  Saw dentist on Monday about dental sensitivity  Much worse for the last week Tried sensodyne No fever, fatigue, SOB  No Known Allergies   Current Outpatient Medications:  .  alendronate (FOSAMAX) 70 MG tablet, TAKE 1 TABLET BY MOUTH ONCE WEEKLY. TAKE WITH FULL GLASS OF WATER ON AN EMPTY STOMACH, Disp: 12 tablet, Rfl: 4 .  ALPRAZolam (XANAX) 0.5 MG tablet, Take 1 tablet (0.5 mg total) by mouth 2 (two) times daily as needed., Disp: 180 tablet, Rfl: 0 .  Calcium-Vitamin D-Vitamin K (VIACTIV  PO), Take by mouth daily., Disp: , Rfl:  .  cetirizine (ZYRTEC) 10 MG tablet, Take 1 tablet by mouth daily., Disp: , Rfl:  .  famotidine (PEPCID) 20 MG tablet, Take 20 mg by mouth 2 (two) times daily., Disp: , Rfl:  .  fluticasone (FLONASE) 50 MCG/ACT nasal spray, Place 2 sprays into the nose daily. 2 sprays each nostril, Disp: , Rfl:  .  gabapentin (NEURONTIN) 300 MG capsule, Take 1 capsule (300 mg total) by mouth at bedtime., Disp: 90 capsule, Rfl: 0 .  levothyroxine (SYNTHROID, LEVOTHROID) 75 MCG tablet, TAKE 1 TABLET BY MOUTH EVERY DAY, Disp: 90 tablet, Rfl: 1 .  Multiple Vitamin (MULTIVITAMIN) tablet, Take 1 tablet by mouth daily., Disp: , Rfl:  .  omeprazole (PRILOSEC) 40 MG capsule, TAKE 1 CAPSULE (40 MG TOTAL) BY MOUTH DAILY., Disp: 90 capsule, Rfl: 4 .  Polyethyl Glycol-Propyl Glycol (SYSTANE ULTRA OP), Apply to eye., Disp: , Rfl:  .  amoxicillin-clavulanate (AUGMENTIN) 875-125 MG tablet, Take 1 tablet by mouth 2 (two) times daily for 7 days., Disp: 14 tablet, Rfl: 0 .  ondansetron (ZOFRAN) 4 MG tablet, Take 1 tablet (4 mg total) by mouth every 8 (eight) hours as needed for nausea or vomiting., Disp: 60 tablet, Rfl: 0  Review of Systems  Constitutional: Negative for appetite change, chills and fever.  HENT: Positive for congestion, dental problem, sinus pressure and sinus pain.   Eyes: Negative.   Respiratory: Positive for cough and shortness of breath. Negative for wheezing.  Gastrointestinal: Negative.   Endocrine: Positive for cold intolerance and heat intolerance.  Neurological: Positive for headaches.    Social History   Tobacco Use  . Smoking status: Never Smoker  . Smokeless tobacco: Never Used  Substance Use Topics  . Alcohol use: No    Alcohol/week: 0.0 standard drinks      Objective:   There were no vitals taken for this visit. There were no vitals filed for this visit.   Physical Exam Constitutional:      General: She is not in acute distress.     Appearance: Normal appearance.  HENT:     Head: Normocephalic and atraumatic.  Eyes:     General: No scleral icterus.    Conjunctiva/sclera: Conjunctivae normal.  Pulmonary:     Effort: Pulmonary effort is normal. No respiratory distress.  Neurological:     Mental Status: She is alert and oriented to person, place, and time. Mental status is at baseline.  Psychiatric:        Mood and Affect: Mood normal.        Behavior: Behavior normal.      No results found for any visits on 01/04/19.     Assessment & Plan    I discussed the assessment and treatment plan with the patient. The patient was provided an opportunity to ask questions and all were answered. The patient agreed with the plan and demonstrated an understanding of the instructions.   The patient was advised to call back or seek an in-person evaluation if the symptoms worsen or if the condition fails to improve as anticipated.   1. Sinus congestion 2. Cough 3. Acute nonintractable headache, unspecified headache type 4. Pain, dental - symptoms most c/w sinusitis   - given duration of symptoms, suspect bacterial etiology - no evidence of AOM, CAP, strep pharyngitis, or other infection - discussed possibility of COVID 19 infection with current symptoms - will send for OP COVID testing - discussed quarantine - will treat with Augmentin x7d - discussed symptomatic management (flonase, decongestants, etc), natural course, and return precautions      Meds ordered this encounter  Medications  . amoxicillin-clavulanate (AUGMENTIN) 875-125 MG tablet    Sig: Take 1 tablet by mouth 2 (two) times daily for 7 days.    Dispense:  14 tablet    Refill:  0     Return if symptoms worsen or fail to improve.   The entirety of the information documented in the History of Present Illness, Review of Systems and Physical Exam were personally obtained by me. Portions of this information were initially documented by [facility] Barnes,  CMA and reviewed by me for thoroughness and accuracy.    Bacigalupo, Marzella SchleinAngela M, MD MPH [facility]

## 2019-01-04 NOTE — Patient Instructions (Signed)
Sinusitis, Adult Sinusitis is inflammation of your sinuses. Sinuses are hollow spaces in the bones around your face. Your sinuses are located:  Around your eyes.  In the middle of your forehead.  Behind your nose.  In your cheekbones. Mucus normally drains out of your sinuses. When your nasal tissues become inflamed or swollen, mucus can become trapped or blocked. This allows bacteria, viruses, and fungi to grow, which leads to infection. Most infections of the sinuses are caused by a virus. Sinusitis can develop quickly. It can last for up to 4 weeks (acute) or for more than 12 weeks (chronic). Sinusitis often develops after a cold. What are the causes? This condition is caused by anything that creates swelling in the sinuses or stops mucus from draining. This includes:  Allergies.  Asthma.  Infection from bacteria or viruses.  Deformities or blockages in your nose or sinuses.  Abnormal growths in the nose (nasal polyps).  Pollutants, such as chemicals or irritants in the air.  Infection from fungi (rare). What increases the risk? You are more likely to develop this condition if you:  Have a weak body defense system (immune system).  Do a lot of swimming or diving.  Overuse nasal sprays.  Smoke. What are the signs or symptoms? The main symptoms of this condition are pain and a feeling of pressure around the affected sinuses. Other symptoms include:  Stuffy nose or congestion.  Thick drainage from your nose.  Swelling and warmth over the affected sinuses.  Headache.  Upper toothache.  A cough that may get worse at night.  Extra mucus that collects in the throat or the back of the nose (postnasal drip).  Decreased sense of smell and taste.  Fatigue.  A fever.  Sore throat.  Bad breath. How is this diagnosed? This condition is diagnosed based on:  Your symptoms.  Your medical history.  A physical exam.  Tests to find out if your condition is  acute or chronic. This may include: ? Checking your nose for nasal polyps. ? Viewing your sinuses using a device that has a light (endoscope). ? Testing for allergies or bacteria. ? Imaging tests, such as an MRI or CT scan. In rare cases, a bone biopsy may be done to rule out more serious types of fungal sinus disease. How is this treated? Treatment for sinusitis depends on the cause and whether your condition is chronic or acute.  If caused by a virus, your symptoms should go away on their own within 10 days. You may be given medicines to relieve symptoms. They include: ? Medicines that shrink swollen nasal passages (topical intranasal decongestants). ? Medicines that treat allergies (antihistamines). ? A spray that eases inflammation of the nostrils (topical intranasal corticosteroids). ? Rinses that help get rid of thick mucus in your nose (nasal saline washes).  If caused by bacteria, your health care provider may recommend waiting to see if your symptoms improve. Most bacterial infections will get better without antibiotic medicine. You may be given antibiotics if you have: ? A severe infection. ? A weak immune system.  If caused by narrow nasal passages or nasal polyps, you may need to have surgery. Follow these instructions at home: Medicines  Take, use, or apply over-the-counter and prescription medicines only as told by your health care provider. These may include nasal sprays.  If you were prescribed an antibiotic medicine, take it as told by your health care provider. Do not stop taking the antibiotic even if you start   to feel better. Hydrate and humidify   Drink enough fluid to keep your urine pale yellow. Staying hydrated will help to thin your mucus.  Use a cool mist humidifier to keep the humidity level in your home above 50%.  Inhale steam for 10-15 minutes, 3-4 times a day, or as told by your health care provider. You can do this in the bathroom while a hot shower is  running.  Limit your exposure to cool or dry air. Rest  Rest as much as possible.  Sleep with your head raised (elevated).  Make sure you get enough sleep each night. General instructions   Apply a warm, moist washcloth to your face 3-4 times a day or as told by your health care provider. This will help with discomfort.  Wash your hands often with soap and water to reduce your exposure to germs. If soap and water are not available, use hand sanitizer.  Do not smoke. Avoid being around people who are smoking (secondhand smoke).  Keep all follow-up visits as told by your health care provider. This is important. Contact a health care provider if:  You have a fever.  Your symptoms get worse.  Your symptoms do not improve within 10 days. Get help right away if:  You have a severe headache.  You have persistent vomiting.  You have severe pain or swelling around your face or eyes.  You have vision problems.  You develop confusion.  Your neck is stiff.  You have trouble breathing. Summary  Sinusitis is soreness and inflammation of your sinuses. Sinuses are hollow spaces in the bones around your face.  This condition is caused by nasal tissues that become inflamed or swollen. The swelling traps or blocks the flow of mucus. This allows bacteria, viruses, and fungi to grow, which leads to infection.  If you were prescribed an antibiotic medicine, take it as told by your health care provider. Do not stop taking the antibiotic even if you start to feel better.  Keep all follow-up visits as told by your health care provider. This is important. This information is not intended to replace advice given to you by your health care provider. Make sure you discuss any questions you have with your health care provider. Document Released: 05/04/2005 Document Revised: 10/04/2017 Document Reviewed: 10/04/2017 Elsevier Patient Education  2020 [employer].

## 2019-01-06 ENCOUNTER — Telehealth: Payer: Self-pay | Admitting: Family Medicine

## 2019-01-06 LAB — NOVEL CORONAVIRUS, NAA: SARS-CoV-2, NAA: DETECTED — AB

## 2019-01-06 NOTE — Telephone Encounter (Signed)
Pt called saying she was called by Dr. B regarding her test results.  Pt's CB# [phone]  I tried every nurse station and Dr. Sharmaine Base office.  No one was available.  Thanks Con Memos

## 2019-01-06 NOTE — Telephone Encounter (Signed)
Spoke with patient and she stated that she returned Dr. Sharmaine [facility] phone call and was not transferred but was put on hold. Patient states that she would like for Dr. B to return her call because she is worried due to having the symptoms she stated at her virtual visit on yesterday 01/05/2019  For like 1 month and have been out around people. Please advise.

## 2019-01-06 NOTE — Telephone Encounter (Signed)
I called the patient back and spoke with her about her positive COVID testing.  As she has had some sinus symptoms for about a month, is difficult to tell when her actual COVID symptoms started.  We will use the date of our last visit, 8/19, as the start of her symptoms and she will quarantine for 10 days after that.  We discussed she will need to be fever free for at least 3 days prior to discontinuing quarantine.  Her husband will also quarantine with her.  He will go for COVID testing today.  Did discuss with her that even if her husband is negative today, because of his continued exposure to her, he should consider being retested at the end of her quarantine.  We discussed symptomatic management.  I discussed strict return and emergency room precautions

## 2019-01-06 NOTE — Telephone Encounter (Signed)
-----   Message from Virginia Crews, MD sent at 01/06/2019  8:14 AM EDT ----- Looks like [facility] tried to call the patient, but were unable to reach her.  I left a VM asking her to call back to our office.  Please advise patient that she is positive for COVID19.  This is likely the source of her symptoms.  She should be quarantining (as well as any household members) for at least 10 days from start of symptoms and at least 3 days fever free.  Illness can sometimes worsen on day 8 or 9.  If she develops significant SOB, she needs to go to the ER.

## 2019-01-07 ENCOUNTER — Other Ambulatory Visit: Payer: Self-pay | Admitting: Family Medicine

## 2019-01-09 ENCOUNTER — Encounter: Payer: Self-pay | Admitting: Family Medicine

## 2019-01-16 ENCOUNTER — Telehealth: Payer: Self-pay

## 2019-01-16 MED ORDER — PREDNISONE 20 MG PO TABS: 40.0000 mg | ORAL_TABLET | Freq: Every day | ORAL | 0 refills | Status: AC

## 2019-01-16 NOTE — Telephone Encounter (Signed)
Patient agrees to try a steroid. She uses [facility].

## 2019-01-16 NOTE — Telephone Encounter (Signed)
Patient called stating that she is still having dental pain after completing the antibiotics. She is also still having sinus symptoms, sensitivity to hot and cold, dry cough, and hoarseness. Pain was the worse yesterday than it has ever been.

## 2019-01-16 NOTE — Telephone Encounter (Signed)
We can try a round of steroids to try to take the pressure off the sinuses and see if this helps with dental pain as well.  Will send Rx if she agrees.

## 2019-01-16 NOTE — Telephone Encounter (Signed)
Rx sent. Take 2 pills with breakfast each morning for 7 days. Take with food

## 2019-01-27 ENCOUNTER — Ambulatory Visit
Admission: RE | Admit: 2019-01-27 | Discharge: 2019-01-27 | Disposition: A | Discharge status: Home / Self Care | Payer: Medicare HMO | Source: Ambulatory Visit | Attending: Family Medicine | Admitting: Family Medicine

## 2019-01-27 DIAGNOSIS — Z1231 Encounter for screening mammogram for malignant neoplasm of breast: Secondary | ICD-10-CM | POA: Insufficient documentation

## 2019-02-08 ENCOUNTER — Encounter: Payer: Self-pay | Admitting: Family Medicine

## 2019-02-08 MED ORDER — PREDNISONE 20 MG PO TABS: 20.0000 mg | ORAL_TABLET | Freq: Every day | ORAL | 0 refills | Status: DC

## 2019-02-09 MED ORDER — PREDNISONE 20 MG PO TABS: 20.0000 mg | ORAL_TABLET | Freq: Every day | ORAL | 0 refills | Status: AC

## 2019-02-09 NOTE — Addendum Note (Signed)
Addended by: Virginia Crews on: 02/09/2019 08:18 AM   Modules accepted: Orders

## 2019-02-20 ENCOUNTER — Other Ambulatory Visit: Payer: Self-pay | Admitting: Family Medicine

## 2019-02-20 NOTE — Telephone Encounter (Signed)
L.O.V. was 09/30/2018 and upcoming appointment is 02/23/2019.

## 2019-02-23 ENCOUNTER — Encounter: Payer: Self-pay | Admitting: Family Medicine

## 2019-02-23 ENCOUNTER — Other Ambulatory Visit: Payer: Self-pay

## 2019-02-23 ENCOUNTER — Ambulatory Visit (INDEPENDENT_AMBULATORY_CARE_PROVIDER_SITE_OTHER): Payer: Medicare HMO | Admitting: Family Medicine

## 2019-02-23 VITALS — BP 114/70 | HR 60 | Temp 97.0°F | Ht 65.0 in | Wt 132.2 lb

## 2019-02-23 DIAGNOSIS — E7800 Pure hypercholesterolemia, unspecified: Secondary | ICD-10-CM

## 2019-02-23 DIAGNOSIS — Z23 Encounter for immunization: Secondary | ICD-10-CM | POA: Diagnosis not present

## 2019-02-23 DIAGNOSIS — E890 Postprocedural hypothyroidism: Secondary | ICD-10-CM

## 2019-02-23 DIAGNOSIS — M810 Age-related osteoporosis without current pathological fracture: Secondary | ICD-10-CM | POA: Diagnosis not present

## 2019-02-23 DIAGNOSIS — R7303 Prediabetes: Secondary | ICD-10-CM

## 2019-02-23 DIAGNOSIS — S41111A Laceration without foreign body of right upper arm, initial encounter: Secondary | ICD-10-CM

## 2019-02-23 DIAGNOSIS — Z0000 Encounter for general adult medical examination without abnormal findings: Secondary | ICD-10-CM | POA: Diagnosis not present

## 2019-02-23 DIAGNOSIS — F419 Anxiety disorder, unspecified: Secondary | ICD-10-CM

## 2019-02-23 DIAGNOSIS — R69 Illness, unspecified: Secondary | ICD-10-CM | POA: Diagnosis not present

## 2019-02-23 MED ORDER — ALPRAZOLAM 0.5 MG PO TABS: 0.2500 mg | ORAL_TABLET | Freq: Two times a day (BID) | ORAL | 0 refills | Status: DC | PRN

## 2019-02-23 NOTE — Progress Notes (Signed)
Patient: Sonya Barnes, Female    DOB: 05/30/1945, 73 y.o.   MRN: [id number] Visit Date: 02/23/2019  Today's Provider: Lavon Paganini, MD   Chief Complaint  Patient presents with  . Medicare Wellness   Subjective:    I, [name] CMA, am acting as a scribe for Lavon Paganini, MD.    Annual wellness visit Sonya Barnes is a 73 y.o. female. She feels well. She reports exercising includes walking. She reports she is sleeping well.  ----------------------------------------------------------- Last pap:10/26/2015 Last mammogram:01/27/2019 Last colonoscopy:02/01/2017  Review of Systems  Constitutional: Negative.   HENT: Positive for dental problem, postnasal drip, rhinorrhea and voice change.   Eyes: Negative.   Respiratory: Negative.   Cardiovascular: Negative.   Gastrointestinal: Positive for abdominal distention.  Endocrine: Negative.   Genitourinary: Negative.   Musculoskeletal: Negative.   Skin: Negative.   Allergic/Immunologic: Positive for environmental allergies.  Neurological: Negative.   Hematological: Negative.   Psychiatric/Behavioral: The patient is nervous/anxious.     Social History   Socioeconomic History  . Marital status: Married    Spouse name: Ronalee Belts  . Number of children: 2  . Years of education: associates  . Highest education level: Not on file  Occupational History  . Occupation: retired    Fish farm manager: [employer]  Social Needs  . Financial resource strain: Not on file  . Food insecurity    Worry: Not on file    Inability: Not on file  . Transportation needs    Medical: Not on file    Non-medical: Not on file  Tobacco Use  . Smoking status: Never Smoker  . Smokeless tobacco: Never Used  Substance and Sexual Activity  . Alcohol use: No    Alcohol/week: 0.0 standard drinks  . Drug use: No  . Sexual activity: Yes  Lifestyle  . Physical activity    Days per week: Not on file    Minutes per session: Not on file  . Stress: Not on file   Relationships  . Social Herbalist on phone: Not on file    Gets together: Not on file    Attends religious service: Not on file    Active member of club or organization: Not on file    Attends meetings of clubs or organizations: Not on file    Relationship status: Not on file  . Intimate partner violence    Fear of current or ex partner: Not on file    Emotionally abused: Not on file    Physically abused: Not on file    Forced sexual activity: Not on file  Other Topics Concern  . Not on file  Social History Narrative  . Not on file    Past Medical History:  Diagnosis Date  . Allergy   . Anxiety   . Arthritis    lower spine  . Bundle branch block, right   . Degenerative disc disease, cervical    pt reports no limitations  . GERD (gastroesophageal reflux disease)   . Hypothyroidism   . Kidney stone   . Osteoporosis    back, hips  . Pre-diabetes   . Tendonitis of elbow, right      Patient Active Problem List   Diagnosis Date Noted  . Problems with swallowing and mastication   . Gastric polyp   . Personal history of colonic polyps   . Healthcare maintenance 01/06/2017  . Hypothyroidism associated with surgical procedure 01/07/2016  . Acid reflux 06/10/2015  .  Abnormal ECG 09/07/2014  . Allergic rhinitis 09/07/2014  . Anxiety 09/07/2014  . Arthritis 09/07/2014  . Clinical depression 09/07/2014  . Prediabetes 09/07/2014  . Cannot sleep 09/07/2014  . Osteoporosis 10/16/2008  . Big thyroid 10/16/2008  . Hypercholesteremia 10/16/2008    Past Surgical History:  Procedure Laterality Date  . BREAST BIOPSY Left 15 years ago   Core BX of calcs. Negtive  . BREAST CYST ASPIRATION Right 15 years ago   Negative  . BREAST SURGERY Left    biopsy  . COLONOSCOPY N/A 02/01/2017   Procedure: COLONOSCOPY;  Surgeon: Midge Minium, MD;  Location: [facility];  Service: Gastroenterology;  Laterality: N/A;  . DILATION AND CURETTAGE OF UTERUS  1956-57  .  ESOPHAGEAL DILATION N/A 02/01/2017   Procedure: ESOPHAGEAL DILATION;  Surgeon: Midge Minium, MD;  Location: [facility];  Service: Gastroenterology;  Laterality: N/A;  . ESOPHAGOGASTRODUODENOSCOPY N/A 02/01/2017   Procedure: ESOPHAGOGASTRODUODENOSCOPY (EGD);  Surgeon: Midge Minium, MD;  Location: [facility];  Service: Gastroenterology;  Laterality: N/A;  . HAMMER TOE SURGERY Bilateral   . OVARIAN CYST REMOVAL  1967  . THYROIDECTOMY, PARTIAL    . TONSILLECTOMY      Her family history includes Arthritis in her mother; Cancer in her mother; Colon polyps in her sister; Diabetes in her father; Early death in her brother; Heart disease in her father and sister; Liver disease in her brother; Lung disease in her mother and sister; Mental illness in her brother; Osteoporosis in her mother and sister; Stroke in her father. There is no history of Breast cancer.   Current Outpatient Medications:  .  alendronate (FOSAMAX) 70 MG tablet, TAKE 1 TABLET BY MOUTH ONCE WEEKLY. TAKE WITH FULL GLASS OF WATER ON AN EMPTY STOMACH, Disp: 12 tablet, Rfl: 4 .  ALPRAZolam (XANAX) 0.5 MG tablet, Take 1 tablet (0.5 mg total) by mouth 2 (two) times daily as needed., Disp: 180 tablet, Rfl: 0 .  Calcium-Vitamin D-Vitamin K (VIACTIV PO), Take by mouth daily., Disp: , Rfl:  .  cetirizine (ZYRTEC) 10 MG tablet, Take 1 tablet by mouth daily., Disp: , Rfl:  .  famotidine (PEPCID) 20 MG tablet, Take 20 mg by mouth 2 (two) times daily., Disp: , Rfl:  .  fluticasone (FLONASE) 50 MCG/ACT nasal spray, Place 2 sprays into the nose daily. 2 sprays each nostril, Disp: , Rfl:  .  gabapentin (NEURONTIN) 300 MG capsule, Take 1 capsule (300 mg total) by mouth at bedtime., Disp: 90 capsule, Rfl: 0 .  levothyroxine (SYNTHROID) 75 MCG tablet, TAKE 1 TABLET BY MOUTH EVERY DAY, Disp: 90 tablet, Rfl: 1 .  Multiple Vitamin (MULTIVITAMIN) tablet, Take 1 tablet by mouth daily., Disp: , Rfl:  .  omeprazole (PRILOSEC) 40 MG capsule, TAKE  1 CAPSULE (40 MG TOTAL) BY MOUTH DAILY., Disp: 90 capsule, Rfl: 4 .  Polyethyl Glycol-Propyl Glycol (SYSTANE ULTRA OP), Apply to eye., Disp: , Rfl:  .  ondansetron (ZOFRAN) 4 MG tablet, Take 1 tablet (4 mg total) by mouth every 8 (eight) hours as needed for nausea or vomiting., Disp: 60 tablet, Rfl: 0  Patient Care Team: Erasmo Downer, MD as PCP - General (Family Medicine)    Objective:    Vitals: BP 114/70 (BP Location: Right Arm, Patient Position: Sitting, Cuff Size: Normal)   Pulse 60   Temp (!) 97 F (36.1 C) (Temporal)   Ht  (1.651 m)   Wt 132 lb 3.2 oz (60 kg)   SpO2 98%  BMI 22.00 kg/m   Physical Exam Vitals signs reviewed.  Constitutional:      General: She is not in acute distress.    Appearance: Normal appearance. She is well-developed. She is not diaphoretic.  HENT:     Head: Normocephalic and atraumatic.     Right Ear: Tympanic membrane, ear canal and external ear normal.     Left Ear: Tympanic membrane, ear canal and external ear normal.  Eyes:     General: No scleral icterus.    Conjunctiva/sclera: Conjunctivae normal.     Pupils: Pupils are equal, round, and reactive to light.  Neck:     Musculoskeletal: Neck supple.     Thyroid: No thyromegaly.  Cardiovascular:     Rate and Rhythm: Normal rate and regular rhythm.     Pulses: Normal pulses.     Heart sounds: Normal heart sounds. No murmur.  Pulmonary:     Effort: Pulmonary effort is normal. No respiratory distress.     Breath sounds: Normal breath sounds. No wheezing or rales.  Abdominal:     General: There is no distension.     Palpations: Abdomen is soft.     Tenderness: There is no abdominal tenderness.  Musculoskeletal:        General: No deformity.     Right lower leg: No edema.     Left lower leg: No edema.  Lymphadenopathy:     Cervical: No cervical adenopathy.  Skin:    General: Skin is warm and dry.     Capillary Refill: Capillary refill takes less than 2 seconds.      Findings: No rash.     Comments: Small laceration on R arm, healing well  Neurological:     Mental Status: She is alert and oriented to person, place, and time. Mental status is at baseline.  Psychiatric:        Mood and Affect: Mood normal.        Behavior: Behavior normal.        Thought Content: Thought content normal.     Activities of Daily Living In your present state of health, do you have any difficulty performing the following activities: 02/23/2019  Hearing? Y  Vision? N  Difficulty concentrating or making decisions? Y  Walking or climbing stairs? N  Dressing or bathing? N  Doing errands, shopping? N  Some recent data might be hidden    Fall Risk Assessment Fall Risk  02/23/2019 09/30/2018 01/21/2018 01/05/2017 10/23/2015  Falls in the past year? 0 0 No No No  Number falls in past yr: 0 - - - -  Injury with Fall? 0 - - - -     Depression Screen PHQ 2/9 Scores 02/23/2019 01/21/2018 01/05/2017 10/23/2015  PHQ - 2 Score 2 6 2 2   PHQ- 9 Score 2 15 8 11    6CIT Screen 02/23/2019  What Year? 0 points  What month? 0 points  What time? 0 points  Count back from 20 0 points  Months in reverse 0 points  Repeat phrase 0 points  Total Score 0    6CIT Screen 02/23/2019  What Year? 0 points  What month? 0 points  What time? 0 points  Count back from 20 0 points  Months in reverse 0 points  Repeat phrase 0 points  Total Score 0      Assessment & Plan:     Annual Wellness Visit  Reviewed patient's Family Medical History Reviewed and updated list of patient's medical providers  Assessment of cognitive impairment was done Assessed patient's functional ability Established a written schedule for health screening services Health Risk Assessent Completed and Reviewed  Exercise Activities and Dietary recommendations Goals    . Exercise 150 minutes per week (moderate activity)    . Increase water intake     Recommend increasing water intake to 6-8 glasses of water a day.         Immunization History  Administered Date(s) Administered  . DTaP 05/18/1996  . Fluad Quad(high Dose 65+) 02/23/2019  . Influenza, High Dose Seasonal PF 02/27/2016, 02/11/2017, 01/21/2018  . Influenza-Unspecified 01/17/2015  . Pneumococcal Conjugate-13 01/26/2014  . Pneumococcal Polysaccharide-23 02/15/2015  . Zoster 12/24/2010    Health Maintenance  Topic Date Due  . TETANUS/TDAP  05/18/2026 (Originally 10/13/1964)  . MAMMOGRAM  01/26/2021  . COLONOSCOPY  02/01/2022  . INFLUENZA VACCINE  Completed  . DEXA SCAN  Completed  . Hepatitis C Screening  Completed  . PNA vac Low Risk Adult  Completed     Discussed health benefits of physical activity, and encouraged her to engage in regular exercise appropriate for her age and condition.    ------------------------------------------------------------------------------------------------------------  Problem List Items Addressed This Visit      Endocrine   Hypothyroidism associated with surgical procedure    Previously well controlled Continue Synthroid at current dose  Recheck TSH and adjust Synthroid as indicated        Relevant Orders   TSH     Musculoskeletal and Integument   Osteoporosis    Doing well on Fosamax which was started in 12/2015 Last DEXA scan shows improvement in bone density Continue Fosamax        Other   Anxiety    Longstanding issue Did not tolerate Zoloft at low dose Okay to use low-dose Xanax very sparingly -has not gotten a refill since 2016      Relevant Medications   ALPRAZolam (XANAX) 0.5 MG tablet   Hypercholesteremia    Not on a statin Recheck fasting lipid panel and CMP      Relevant Orders   Comprehensive metabolic panel   Lipid panel   Prediabetes    Discussed diet and exercise Recheck A1c      Relevant Orders   Hemoglobin A1c    Other Visit Diagnoses    Medicare annual wellness visit, subsequent    -  Primary   Encounter for annual physical exam       Relevant Orders    Comprehensive metabolic panel   Lipid panel   TSH   CBC   Hemoglobin A1c   Need for influenza vaccination       Relevant Orders   Flu Vaccine QUAD High Dose(Fluad) (Completed)   Laceration of right upper extremity, initial encounter       Relevant Orders   Td vaccine greater than or equal to 7yo preservative free IM (Completed)       Return in about 6 months (around 08/24/2019) for chronic disease f/u.   The entirety of the information documented in the History of Present Illness, Review of Systems and Physical Exam were personally obtained by me. Portions of this information were initially documented by Presley RaddleNikki Walston, CMA and reviewed by me for thoroughness and accuracy.    Stanislav Gervase, Marzella SchleinAngela M, MD MPH [facility]

## 2019-02-23 NOTE — Assessment & Plan Note (Signed)
Previously well controlled Continue Synthroid at current dose  Recheck TSH and adjust Synthroid as indicated   

## 2019-02-23 NOTE — Patient Instructions (Signed)
Preventive Care 73 Years and Older, Female Preventive care refers to lifestyle choices and visits with your health care provider that can promote health and wellness. This includes:  A yearly physical exam. This is also called an annual well check.  Regular dental and eye exams.  Immunizations.  Screening for certain conditions.  Healthy lifestyle choices, such as diet and exercise. What can I expect for my preventive care visit? Physical exam Your health care provider will check:  Height and weight. These may be used to calculate body mass index (BMI), which is a measurement that tells if you are at a healthy weight.  Heart rate and blood pressure.  Your skin for abnormal spots. Counseling Your health care provider may ask you questions about:  Alcohol, tobacco, and drug use.  Emotional well-being.  Home and relationship well-being.  Sexual activity.  Eating habits.  History of falls.  Memory and ability to understand (cognition).  Work and work Statistician.  Pregnancy and menstrual history. What immunizations do I need?  Influenza (flu) vaccine  This is recommended every year. Tetanus, diphtheria, and pertussis (Tdap) vaccine  You may need a Td booster every 10 years. Varicella (chickenpox) vaccine  You may need this vaccine if you have not already been vaccinated. Zoster (shingles) vaccine  You may need this after age 33. Pneumococcal conjugate (PCV13) vaccine  One dose is recommended after age 33. Pneumococcal polysaccharide (PPSV23) vaccine  One dose is recommended after age 72. Measles, mumps, and rubella (MMR) vaccine  You may need at least one dose of MMR if you were born in 1957 or later. You may also need a second dose. Meningococcal conjugate (MenACWY) vaccine  You may need this if you have certain conditions. Hepatitis A vaccine  You may need this if you have certain conditions or if you travel or work in places where you may be exposed  to hepatitis A. Hepatitis B vaccine  You may need this if you have certain conditions or if you travel or work in places where you may be exposed to hepatitis B. Haemophilus influenzae type b (Hib) vaccine  You may need this if you have certain conditions. You may receive vaccines as individual doses or as more than one vaccine together in one shot (combination vaccines). Talk with your health care provider about the risks and benefits of combination vaccines. What tests do I need? Blood tests  Lipid and cholesterol levels. These may be checked every 5 years, or more frequently depending on your overall health.  Hepatitis C test.  Hepatitis B test. Screening  Lung cancer screening. You may have this screening every year starting at age 39 if you have a 30-pack-year history of smoking and currently smoke or have quit within the past 15 years.  Colorectal cancer screening. All adults should have this screening starting at age 36 and continuing until age 15. Your health care provider may recommend screening at age 23 if you are at increased risk. You will have tests every 1-10 years, depending on your results and the type of screening test.  Diabetes screening. This is done by checking your blood sugar (glucose) after you have not eaten for a while (fasting). You may have this done every 1-3 years.  Mammogram. This may be done every 1-2 years. Talk with your health care provider about how often you should have regular mammograms.  BRCA-related cancer screening. This may be done if you have a family history of breast, ovarian, tubal, or peritoneal cancers.  Other tests  Sexually transmitted disease (STD) testing.  Bone density scan. This is done to screen for osteoporosis. You may have this done starting at age 76. Follow these instructions at home: Eating and drinking  Eat a diet that includes fresh fruits and vegetables, whole grains, lean protein, and low-fat dairy products. Limit  your intake of foods with high amounts of sugar, saturated fats, and salt.  Take vitamin and mineral supplements as recommended by your health care provider.  Do not drink alcohol if your health care provider tells you not to drink.  If you drink alcohol: ? Limit how much you have to 0-1 drink a day. ? Be aware of how much alcohol is in your drink. In the [place] one drink equals one 12 oz bottle of beer (355 mL), one 5 oz glass of wine (148 mL), or one 1 oz glass of hard liquor (44 mL). Lifestyle  Take daily care of your teeth and gums.  Stay active. Exercise for at least 30 minutes on 5 or more days each week.  Do not use any products that contain nicotine or tobacco, such as cigarettes, e-cigarettes, and chewing tobacco. If you need help quitting, ask your health care provider.  If you are sexually active, practice safe sex. Use a condom or other form of protection in order to prevent STIs (sexually transmitted infections).  Talk with your health care provider about taking a low-dose aspirin or statin. What's next?  Go to your health care provider once a year for a well check visit.  Ask your health care provider how often you should have your eyes and teeth checked.  Stay up to date on all vaccines. This information is not intended to replace advice given to you by your health care provider. Make sure you discuss any questions you have with your health care provider. Document Released: 05/31/2015 Document Revised: 04/28/2018 Document Reviewed: 04/28/2018 Elsevier Patient Education  2020 [employer].

## 2019-02-23 NOTE — Assessment & Plan Note (Signed)
Not on a statin Recheck fasting lipid panel and CMP

## 2019-02-23 NOTE — Assessment & Plan Note (Signed)
Longstanding issue Did not tolerate Zoloft at low dose Okay to use low-dose Xanax very sparingly -has not gotten a refill since 2016

## 2019-02-23 NOTE — Assessment & Plan Note (Signed)
Discussed diet and exercise Recheck A1c 

## 2019-02-23 NOTE — Assessment & Plan Note (Signed)
Doing well on Fosamax which was started in 12/2015 Last DEXA scan shows improvement in bone density Continue Fosamax

## 2019-02-24 ENCOUNTER — Telehealth: Payer: Self-pay

## 2019-02-24 ENCOUNTER — Other Ambulatory Visit: Payer: Self-pay | Admitting: Family Medicine

## 2019-02-24 LAB — COMPREHENSIVE METABOLIC PANEL
ALT: 13 IU/L (ref 0–32)
AST: 26 IU/L (ref 0–40)
Albumin/Globulin Ratio: 2.2 (ref 1.2–2.2)
Albumin: 4.2 g/dL (ref 3.7–4.7)
Alkaline Phosphatase: 80 IU/L (ref 39–117)
BUN/Creatinine Ratio: 19 (ref 12–28)
BUN: 17 mg/dL (ref 8–27)
Bilirubin Total: 0.3 mg/dL (ref 0.0–1.2)
CO2: 25 mmol/L (ref 20–29)
Calcium: 9.5 mg/dL (ref 8.7–10.3)
Chloride: 101 mmol/L (ref 96–106)
Creatinine, Ser: 0.89 mg/dL (ref 0.57–1.00)
GFR calc Af Amer: 74 mL/min/{1.73_m2} (ref >59)
GFR calc non Af Amer: 65 mL/min/{1.73_m2} (ref >59)
Globulin, Total: 1.9 g/dL (ref 1.5–4.5)
Glucose: 86 mg/dL (ref 65–99)
Potassium: 4 mmol/L (ref 3.5–5.2)
Sodium: 140 mmol/L (ref 134–144)
Total Protein: 6.1 g/dL (ref 6.0–8.5)

## 2019-02-24 LAB — LIPID PANEL
Chol/HDL Ratio: 3.4 ratio (ref 0.0–4.4)
Cholesterol, Total: 225 mg/dL — ABNORMAL HIGH (ref 100–199)
HDL: 66 mg/dL (ref >39)
LDL Chol Calc (NIH): 138 mg/dL — ABNORMAL HIGH (ref 0–99)
Triglycerides: 118 mg/dL (ref 0–149)
VLDL Cholesterol Cal: 21 mg/dL (ref 5–40)

## 2019-02-24 LAB — CBC
Hematocrit: 39.6 % (ref 34.0–46.6)
Hemoglobin: 13.5 g/dL (ref 11.1–15.9)
MCH: 30.2 pg (ref 26.6–33.0)
MCHC: 34.1 g/dL (ref 31.5–35.7)
MCV: 89 fL (ref 79–97)
Platelets: 328 10*3/uL (ref 150–450)
RBC: 4.47 x10E6/uL (ref 3.77–5.28)
RDW: 13 % (ref 11.7–15.4)
WBC: 6.1 10*3/uL (ref 3.4–10.8)

## 2019-02-24 LAB — HEMOGLOBIN A1C
Est. average glucose Bld gHb Est-mCnc: 117 mg/dL
Hgb A1c MFr Bld: 5.7 % — ABNORMAL HIGH (ref 4.8–5.6)

## 2019-02-24 LAB — TSH: TSH: 0.567 u[IU]/mL (ref 0.450–4.500)

## 2019-02-24 NOTE — Telephone Encounter (Signed)
Pt advised.   Thanks,   -Laura  

## 2019-02-24 NOTE — Telephone Encounter (Signed)
-----   Message from Virginia Crews, MD sent at 02/24/2019  8:54 AM EDT ----- Normal labs, except cholesterol remains slightly elevated.  No medication needed at this time, but recommend diet low in saturated fat and regular exercise - 30 min at least 5 times per week.  Blood sugar is also in the prediabetic range.  This is where it has been near for the last 3 years.  Try to decrease carb intake.

## 2019-03-28 DIAGNOSIS — R69 Illness, unspecified: Secondary | ICD-10-CM | POA: Diagnosis not present

## 2019-03-29 DIAGNOSIS — R69 Illness, unspecified: Secondary | ICD-10-CM | POA: Diagnosis not present

## 2019-04-05 DIAGNOSIS — R69 Illness, unspecified: Secondary | ICD-10-CM | POA: Diagnosis not present

## 2019-06-03 ENCOUNTER — Other Ambulatory Visit: Payer: Self-pay | Admitting: Family Medicine

## 2019-06-07 ENCOUNTER — Telehealth: Payer: Self-pay | Admitting: Family Medicine

## 2019-06-07 DIAGNOSIS — K219 Gastro-esophageal reflux disease without esophagitis: Secondary | ICD-10-CM

## 2019-06-07 MED ORDER — ALENDRONATE SODIUM 70 MG PO TABS: ORAL_TABLET | ORAL | 4 refills | Status: DC

## 2019-06-07 MED ORDER — OMEPRAZOLE 40 MG PO CPDR: 40.0000 mg | DELAYED_RELEASE_CAPSULE | Freq: Every day | ORAL | 4 refills | Status: DC

## 2019-06-07 NOTE — Telephone Encounter (Signed)
Medication Refill - Medication:  omeprazole (PRILOSEC) 40 MG capsule [[id number] alendronate (FOSAMAX) 70 MG tablet [[id number]]    Has the patient contacted their pharmacy? No. (Agent: If no, request that the patient contact the pharmacy for the refill.) (Agent: If yes, when and what did the pharmacy advise?)  Preferred Pharmacy (with phone number or street name): [facility] on [address]   Agent: Please be advised that RX refills may take up to 3 business days. We ask that you follow-up with your pharmacy.

## 2019-06-14 ENCOUNTER — Ambulatory Visit: Payer: Medicare HMO

## 2019-06-23 ENCOUNTER — Ambulatory Visit: Payer: Medicare HMO | Attending: Internal Medicine

## 2019-06-23 ENCOUNTER — Ambulatory Visit (INDEPENDENT_AMBULATORY_CARE_PROVIDER_SITE_OTHER): Payer: Medicare HMO | Admitting: Physician Assistant

## 2019-06-23 ENCOUNTER — Encounter: Payer: Self-pay | Admitting: Physician Assistant

## 2019-06-23 VITALS — BP 116/71 | HR 64 | Temp 95.1°F | Resp 14

## 2019-06-23 DIAGNOSIS — R6889 Other general symptoms and signs: Secondary | ICD-10-CM | POA: Diagnosis not present

## 2019-06-23 DIAGNOSIS — R0989 Other specified symptoms and signs involving the circulatory and respiratory systems: Secondary | ICD-10-CM

## 2019-06-23 DIAGNOSIS — Z23 Encounter for immunization: Secondary | ICD-10-CM

## 2019-06-23 NOTE — Progress Notes (Signed)
Patient: Sonya Barnes Female    DOB: [birth year]   74 y.o.   MRN: [id number] Visit Date: 06/23/2019  Today's Provider: Trinna Post, PA-C   Chief Complaint  Patient presents with  . vaccine reaction   Subjective:     HPI   Patient presents today because she thinks she is having a reaction to the covid vaccine. Patient was vaccinated with first dose of Pfizer vaccine at 9:20 AM in [place].  Patient states that she sat for 15 minutes after she got the vaccine and she felt fine, after she started driving she started to feel lightheaded, jittery and like she was feeling like her throat was closing. This sensation reduced over time but she was concerned about it and so walked into our clinic today. She denies previous history of anaphylactic reactions.  She denies difficulty breathing.    No Known Allergies   Current Outpatient Medications:  .  alendronate (FOSAMAX) 70 MG tablet, TAKE 1 TABLET BY MOUTH ONCE WEEKLY. TAKE WITH FULL GLASS OF WATER ON AN EMPTY STOMACH, Disp: 12 tablet, Rfl: 4 .  ALPRAZolam (XANAX) 0.5 MG tablet, Take 0.5-1 tablets (0.25-0.5 mg total) by mouth 2 (two) times daily as needed., Disp: 30 tablet, Rfl: 0 .  Calcium-Vitamin D-Vitamin K (VIACTIV PO), Take by mouth daily., Disp: , Rfl:  .  cetirizine (ZYRTEC) 10 MG tablet, Take 1 tablet by mouth daily., Disp: , Rfl:  .  famotidine (PEPCID) 20 MG tablet, Take 20 mg by mouth 2 (two) times daily., Disp: , Rfl:  .  fluticasone (FLONASE) 50 MCG/ACT nasal spray, Place 2 sprays into the nose daily. 2 sprays each nostril, Disp: , Rfl:  .  gabapentin (NEURONTIN) 300 MG capsule, Take 1 capsule (300 mg total) by mouth at bedtime., Disp: 90 capsule, Rfl: 3 .  levothyroxine (SYNTHROID) 75 MCG tablet, TAKE 1 TABLET BY MOUTH EVERY DAY, Disp: 90 tablet, Rfl: 1 .  Multiple Vitamin (MULTIVITAMIN) tablet, Take 1 tablet by mouth daily., Disp: , Rfl:  .  omeprazole (PRILOSEC) 40 MG capsule, Take 1 capsule (40 mg total) by  mouth daily., Disp: 90 capsule, Rfl: 4 .  Polyethyl Glycol-Propyl Glycol (SYSTANE ULTRA OP), Apply to eye., Disp: , Rfl:   Review of Systems  Social History   Tobacco Use  . Smoking status: Never Smoker  . Smokeless tobacco: Never Used  Substance Use Topics  . Alcohol use: No    Alcohol/week: 0.0 standard drinks      Objective:   BP 116/71 (BP Location: Right Arm, Patient Position: Sitting, Cuff Size: Normal)   Pulse 64   Temp (!) 95.1 F (35.1 C) (Temporal)   Resp 14   SpO2 97%  Vitals:   06/23/19 1025 06/23/19 1111  BP: 124/77 116/71  Pulse: 64 64  Resp: 14   Temp: (!) 95.1 F (35.1 C)   TempSrc: Temporal   SpO2: 97%   There is no height or weight on file to calculate BMI.   Physical Exam Constitutional:      General: She is not in acute distress.    Appearance: Normal appearance. She is not ill-appearing.  HENT:     Mouth/Throat:     Mouth: Mucous membranes are moist. No angioedema.     Pharynx: Oropharynx is clear. No pharyngeal swelling or uvula swelling.  Cardiovascular:     Rate and Rhythm: Normal rate and regular rhythm.     Heart sounds: Normal heart sounds.  Pulmonary:  Effort: Pulmonary effort is normal.     Breath sounds: Normal breath sounds.  Skin:    General: Skin is warm and dry.  Neurological:     Mental Status: She is alert and oriented to person, place, and time. Mental status is at baseline.  Psychiatric:        Mood and Affect: Mood is anxious.        Behavior: Behavior normal.      No results found for any visits on 06/23/19.     Assessment & Plan    1. Throat tightness  Patient appears well on exam, though slightly anxious. Her vital signs are all within normal limits. At the time of appointment, it was 10:34 Am and she was monitored for an additional 30 minutes with another set of normal vital signs taken at 11:00 AM. According to a CDC report on the rate of anaphylactic reactions with first dose of Pfizer issued on June 02, 2019, it showed that these reactions happened at a rate of 11.1 cases per million. 71% of these reactions happened within 15 minutes. Patient does not appear to have an anaphylactic reaction in clinic today. Her symptoms are more consistent with an anxiety state. Counseled patient on exceedingly rare nature of this and how I think she will be fine to get her second dose. Return precautions counseled.   The entirety of the information documented in the History of Present Illness, Review of Systems and Physical Exam were personally obtained by me. Portions of this information were initially documented by Angelica Ran, CMA and reviewed by me for thoroughness and accuracy.   F/u PRN  I have spent 25 minutes with this patient, >50% of which was spent on counseling and coordination of care.       Trey Sailors, PA-C  [facility]

## 2019-06-23 NOTE — Patient Instructions (Signed)
Allergies, Adult An allergy means that your body reacts to something that bothers it (allergen). It is not a normal reaction. This can happen from something that you:  Eat.  Breathe in.  Touch. You can have an allergy (be allergic) to:  Outdoor things, like: ? Pollen. ? Grass. ? Weeds.  Indoor things, like: ? Dust. ? Smoke. ? Pet dander.  Foods.  Medicines.  Things that bother your skin, like: ? Detergents. ? Chemicals. ? Latex.  Perfume.  Bugs. An allergy cannot spread from person to person (is not contagious). Follow these instructions at home:         Stay away from things that you know you are allergic to.  If you have allergies to things in the air, wash out your nose each day. Do it with one of these: ? A salt-water (saline) spray. ? A container (neti pot).  Take over-the-counter and prescription medicines only as told by your doctor.  Keep all follow-up visits as told by your doctor. This is important.  If you are at risk for a very bad allergy reaction (anaphylaxis), keep an auto-injector with you all the time. This is called an epinephrine injection. ? This is pre-measured medicine with a needle. You can put it into your skin by yourself. ? Right after you have a very bad allergy reaction, you or a person with you must give the medicine in less than a few minutes. This is an emergency.  If you have ever had a very bad allergy reaction, wear a medical alert bracelet or necklace. Your very bad allergy should be written on it. Contact a health care provider if:  Your symptoms do not get better with treatment. Get help right away if:  You have symptoms of a very bad allergy reaction. These include: ? A swollen mouth, tongue, or throat. ? Pain or tightness in your chest. ? Trouble breathing. ? Being short of breath. ? Dizziness. ? Fainting. ? Very bad pain in your belly (abdomen). ? Throwing up (vomiting). ? Watery poop  (diarrhea). Summary  An allergy means that your body reacts to something that bothers it (allergen). It is not a normal reaction.  Stay away from things that make your body react.  Take over-the-counter and prescription medicines only as told by your doctor.  If you are at risk for a very bad allergy reaction, carry an auto-injector (epinephrine injection) all the time. Also, wear a medical alert bracelet or necklace so people know about your allergy. This information is not intended to replace advice given to you by your health care provider. Make sure you discuss any questions you have with your health care provider. Document Revised: 08/23/2018 Document Reviewed: 08/17/2016 Elsevier Patient Education  2020 Elsevier Inc.  

## 2019-06-23 NOTE — Progress Notes (Signed)
   Covid-19 Vaccination Clinic  Name:  Sonya Barnes    MRN: [id number] DOB: Dec 22, 1945  06/23/2019  [name] was observed post Covid-19 immunization for 15 minutes without incidence. She was provided with Vaccine Information Sheet and instruction to access the V-Safe system.   [name] was instructed to call 911 with any severe reactions post vaccine: Marland Kitchen Difficulty breathing  . Swelling of your face and throat  . A fast heartbeat  . A bad rash all over your body  . Dizziness and weakness    Immunizations Administered    Name Date Dose VIS Date Route   Pfizer COVID-19 Vaccine 06/23/2019  9:17 AM 0.3 mL 04/28/2019 Intramuscular   Manufacturer: [employer], [employer]   Lot: [id number]   NDC: [address]-9

## 2019-06-27 DIAGNOSIS — R69 Illness, unspecified: Secondary | ICD-10-CM | POA: Diagnosis not present

## 2019-07-17 ENCOUNTER — Ambulatory Visit: Payer: Medicare HMO | Attending: Internal Medicine

## 2019-07-17 DIAGNOSIS — Z23 Encounter for immunization: Secondary | ICD-10-CM

## 2019-07-17 NOTE — Progress Notes (Signed)
   Covid-19 Vaccination Clinic  Name:  Sonya Barnes    MRN: [id number] DOB: 1946-05-17  07/17/2019  Sonya Barnes was observed post Covid-19 immunization for 15 minutes without incidence. She was provided with Vaccine Information Sheet and instruction to access the V-Safe system.   Sonya Barnes was instructed to call 911 with any severe reactions post vaccine: Marland Kitchen Difficulty breathing  . Swelling of your face and throat  . A fast heartbeat  . A bad rash all over your body  . Dizziness and weakness    Immunizations Administered    Name Date Dose VIS Date Route   Pfizer COVID-19 Vaccine 07/17/2019  8:14 AM 0.3 mL 04/28/2019 Intramuscular   Manufacturer: [employer], [employer]   Lot: [id number]   NDC: [address]-4

## 2019-08-24 ENCOUNTER — Ambulatory Visit: Payer: Self-pay | Admitting: Family Medicine

## 2019-08-29 ENCOUNTER — Encounter: Payer: Self-pay | Admitting: Family Medicine

## 2019-08-29 ENCOUNTER — Other Ambulatory Visit: Payer: Self-pay

## 2019-08-29 ENCOUNTER — Ambulatory Visit (INDEPENDENT_AMBULATORY_CARE_PROVIDER_SITE_OTHER): Payer: Medicare HMO | Admitting: Family Medicine

## 2019-08-29 VITALS — BP 100/57 | HR 48 | Temp 98.5°F | Resp 16 | Ht 65.0 in | Wt 130.0 lb

## 2019-08-29 DIAGNOSIS — F419 Anxiety disorder, unspecified: Secondary | ICD-10-CM | POA: Diagnosis not present

## 2019-08-29 DIAGNOSIS — R7303 Prediabetes: Secondary | ICD-10-CM | POA: Diagnosis not present

## 2019-08-29 DIAGNOSIS — E890 Postprocedural hypothyroidism: Secondary | ICD-10-CM

## 2019-08-29 DIAGNOSIS — R69 Illness, unspecified: Secondary | ICD-10-CM | POA: Diagnosis not present

## 2019-08-29 LAB — POCT GLYCOSYLATED HEMOGLOBIN (HGB A1C)
Est. average glucose Bld gHb Est-mCnc: 111
Hemoglobin A1C: 5.5 % (ref 4.0–5.6)

## 2019-08-29 NOTE — Assessment & Plan Note (Signed)
Stable Low dose Xanax very sparingly 

## 2019-08-29 NOTE — Patient Instructions (Signed)
Hypothyroidism  Hypothyroidism is when the thyroid gland does not make enough of certain hormones (it is underactive). The thyroid gland is a small gland located in the lower front part of the neck, just in front of the windpipe (trachea). This gland makes hormones that help control how the body uses food for energy (metabolism) as well as how the heart and brain function. These hormones also play a role in keeping your bones strong. When the thyroid is underactive, it produces too little of the hormones thyroxine (T4) and triiodothyronine (T3). What are the causes? This condition may be caused by:  Hashimoto's disease. This is a disease in which the body's disease-fighting system (immune system) attacks the thyroid gland. This is the most common cause.  Viral infections.  Pregnancy.  Certain medicines.  Birth defects.  Past radiation treatments to the head or neck for cancer.  Past treatment with radioactive iodine.  Past exposure to radiation in the environment.  Past surgical removal of part or all of the thyroid.  Problems with a gland in the center of the brain (pituitary gland).  Lack of enough iodine in the diet. What increases the risk? You are more likely to develop this condition if:  You are female.  You have a family history of thyroid conditions.  You use a medicine called lithium.  You take medicines that affect the immune system (immunosuppressants). What are the signs or symptoms? Symptoms of this condition include:  Feeling as though you have no energy (lethargy).  Not being able to tolerate cold.  Weight gain that is not explained by a change in diet or exercise habits.  Lack of appetite.  Dry skin.  Coarse hair.  Menstrual irregularity.  Slowing of thought processes.  Constipation.  Sadness or depression. How is this diagnosed? This condition may be diagnosed based on:  Your symptoms, your medical history, and a physical exam.  Blood  tests. You may also have imaging tests, such as an ultrasound or MRI. How is this treated? This condition is treated with medicine that replaces the thyroid hormones that your body does not make. After you begin treatment, it may take several weeks for symptoms to go away. Follow these instructions at home:  Take over-the-counter and prescription medicines only as told by your health care provider.  If you start taking any new medicines, tell your health care provider.  Keep all follow-up visits as told by your health care provider. This is important. ? As your condition improves, your dosage of thyroid hormone medicine may change. ? You will need to have blood tests regularly so that your health care provider can monitor your condition. Contact a health care provider if:  Your symptoms do not get better with treatment.  You are taking thyroid replacement medicine and you: ? Sweat a lot. ? Have tremors. ? Feel anxious. ? Lose weight rapidly. ? Cannot tolerate heat. ? Have emotional swings. ? Have diarrhea. ? Feel weak. Get help right away if you have:  Chest pain.  An irregular heartbeat.  A rapid heartbeat.  Difficulty breathing. Summary  Hypothyroidism is when the thyroid gland does not make enough of certain hormones (it is underactive).  When the thyroid is underactive, it produces too little of the hormones thyroxine (T4) and triiodothyronine (T3).  The most common cause is Hashimoto's disease, a disease in which the body's disease-fighting system (immune system) attacks the thyroid gland. The condition can also be caused by viral infections, medicine, pregnancy, or past   radiation treatment to the head or neck.  Symptoms may include weight gain, dry skin, constipation, feeling as though you do not have energy, and not being able to tolerate cold.  This condition is treated with medicine to replace the thyroid hormones that your body does not make. This information  is not intended to replace advice given to you by your health care provider. Make sure you discuss any questions you have with your health care provider. Document Revised: 04/16/2017 Document Reviewed: 04/14/2017 Elsevier Patient Education  2020 Elsevier Inc.  

## 2019-08-29 NOTE — Progress Notes (Signed)
Established patient visit      Patient: Sonya Barnes   DOB: 21-Apr-1946   74 y.o. Female  MRN: [id number] Visit Date: 08/29/2019  Today's healthcare provider: Lavon Paganini, MD  Subjective:    Chief Complaint  Patient presents with  . Hyperglycemia  . Hypothyroidism  . Anxiety   HPI  Patient here today to follow up on hyperglycemia, last A1c was 5.7. Patient reports she has been eating a few more sweets but is exercising.  Hypothyroid, follow-up Last office visit was 6 months ago. Patient reports good compliance with treatment. She is not having side effects. She is exercising. She denies changes in energy level, diarrhea, heat/cold intolerance, nervousness, palpitations and weight changes. Weight trend: stable  Anxiety Patient reports she is feeling much better.  Had a panic attack after COVID vaccine while [place] the interstate. Patient denies any symptoms.   Lab Results  Component Value Date   HGBA1C 5.5 08/29/2019     Social History   Tobacco Use  . Smoking status: Never Smoker  . Smokeless tobacco: Never Used  Substance Use Topics  . Alcohol use: No    Alcohol/week: 0.0 standard drinks  . Drug use: No       Medications: Outpatient Medications Prior to Visit  Medication Sig  . alendronate (FOSAMAX) 70 MG tablet TAKE 1 TABLET BY MOUTH ONCE WEEKLY. TAKE WITH FULL GLASS OF WATER ON AN EMPTY STOMACH  . ALPRAZolam (XANAX) 0.5 MG tablet Take 0.5-1 tablets (0.25-0.5 mg total) by mouth 2 (two) times daily as needed.  . Calcium-Vitamin D-Vitamin K (VIACTIV PO) Take by mouth daily.  . cetirizine (ZYRTEC) 10 MG tablet Take 1 tablet by mouth daily.  . famotidine (PEPCID) 20 MG tablet Take 20 mg by mouth 2 (two) times daily.  . fluticasone (FLONASE) 50 MCG/ACT nasal spray Place 2 sprays into the nose daily. 2 sprays each nostril  . gabapentin (NEURONTIN) 300 MG capsule Take 1 capsule (300 mg total) by mouth at bedtime.  Marland Kitchen levothyroxine (SYNTHROID) 75 MCG  tablet TAKE 1 TABLET BY MOUTH EVERY DAY  . Multiple Vitamin (MULTIVITAMIN) tablet Take 1 tablet by mouth daily.  Marland Kitchen omeprazole (PRILOSEC) 40 MG capsule Take 1 capsule (40 mg total) by mouth daily.  Vladimir Faster Glycol-Propyl Glycol (SYSTANE ULTRA OP) Apply to eye.   No facility-administered medications prior to visit.    Review of Systems  Constitutional: Negative.   Cardiovascular: Negative.   Endocrine: Negative.     Last hemoglobin A1c Lab Results  Component Value Date   HGBA1C 5.5 08/29/2019        Objective:    BP (!) 100/57 (BP Location: Left Arm, Patient Position: Sitting, Cuff Size: Normal)   Pulse (!) 48   Temp 98.5 F (36.9 C) (Temporal)   Resp 16   Ht '5\' 5"'$  (1.651 m)   Wt 130 lb (59 kg)   BMI 21.63 kg/m    Physical Exam Vitals reviewed.  Constitutional:      General: She is not in acute distress.    Appearance: Normal appearance. She is well-developed. She is not diaphoretic.  HENT:     Head: Normocephalic and atraumatic.  Eyes:     General: No scleral icterus.    Conjunctiva/sclera: Conjunctivae normal.  Neck:     Thyroid: No thyromegaly.  Cardiovascular:     Rate and Rhythm: Normal rate and regular rhythm.     Pulses: Normal pulses.     Heart sounds: Normal heart  sounds. No murmur.  Pulmonary:     Effort: Pulmonary effort is normal. No respiratory distress.     Breath sounds: Normal breath sounds. No wheezing, rhonchi or rales.  Musculoskeletal:     Cervical back: Neck supple. No tenderness.     Right lower leg: No edema.     Left lower leg: No edema.  Lymphadenopathy:     Cervical: No cervical adenopathy.  Skin:    General: Skin is warm and dry.     Findings: No rash.  Neurological:     Mental Status: She is alert and oriented to person, place, and time. Mental status is at baseline.  Psychiatric:        Mood and Affect: Mood normal.        Behavior: Behavior normal.      Results for orders placed or performed in visit on 08/29/19    POCT glycosylated hemoglobin (Hb A1C)  Result Value Ref Range   Hemoglobin A1C 5.5 4.0 - 5.6 %   Est. average glucose Bld gHb Est-mCnc 111       Assessment & Plan:    Problem List Items Addressed This Visit      Endocrine   Hypothyroidism associated with surgical procedure    Well controlled. Continue Synthroid at current dose Recheck TSH and adjust Synthroid as indicated       Relevant Orders   TSH     Other   Anxiety    Stable Low dose Xanax very sparingly.      Prediabetes - Primary    A1c is at 5.5 today Well controlled on diet and exercise Will recheck A1c at next visit No medications indicated at this time.      Relevant Orders   POCT glycosylated hemoglobin (Hb A1C) (Completed)     Return in about 6 months (around 02/28/2020) for AWV and chronic disease follow up.     I, Lavon Paganini, MD, have reviewed all documentation for this visit. The documentation on 08/29/19 for the exam, diagnosis, procedures, and orders are all accurate and complete.  Aymen Widrig, Dionne Bucy, MD, MPH [facility]

## 2019-08-29 NOTE — Assessment & Plan Note (Signed)
A1c is at 5.5 today Well controlled on diet and exercise Will recheck A1c at next visit No medications indicated at this time.

## 2019-08-29 NOTE — Assessment & Plan Note (Addendum)
Well controlled. Continue Synthroid at current dose  Recheck TSH and adjust Synthroid as indicated   

## 2019-08-30 ENCOUNTER — Telehealth: Payer: Self-pay

## 2019-08-30 LAB — TSH: TSH: 0.507 u[IU]/mL (ref 0.450–4.500)

## 2019-08-30 NOTE — Telephone Encounter (Signed)
[employer]   Result Notes and Comments to Patient Comment seen by patient Sonya Barnes on 08/30/2019 8:41 AM EDT

## 2019-08-30 NOTE — Telephone Encounter (Signed)
-----   Message from Erasmo Downer, MD sent at 08/30/2019  8:32 AM EDT ----- Normal labs

## 2019-10-13 IMAGING — MG MM DIGITAL SCREENING BILAT W/ TOMO W/ CAD
8 series · 8 of 24 positions shown · non-contrast
Comparison: Previous exam(s).

CLINICAL DATA: Screening.

EXAM:
DIGITAL SCREENING BILATERAL MAMMOGRAM WITH TOMO AND CAD

[R MLO synth-2D]
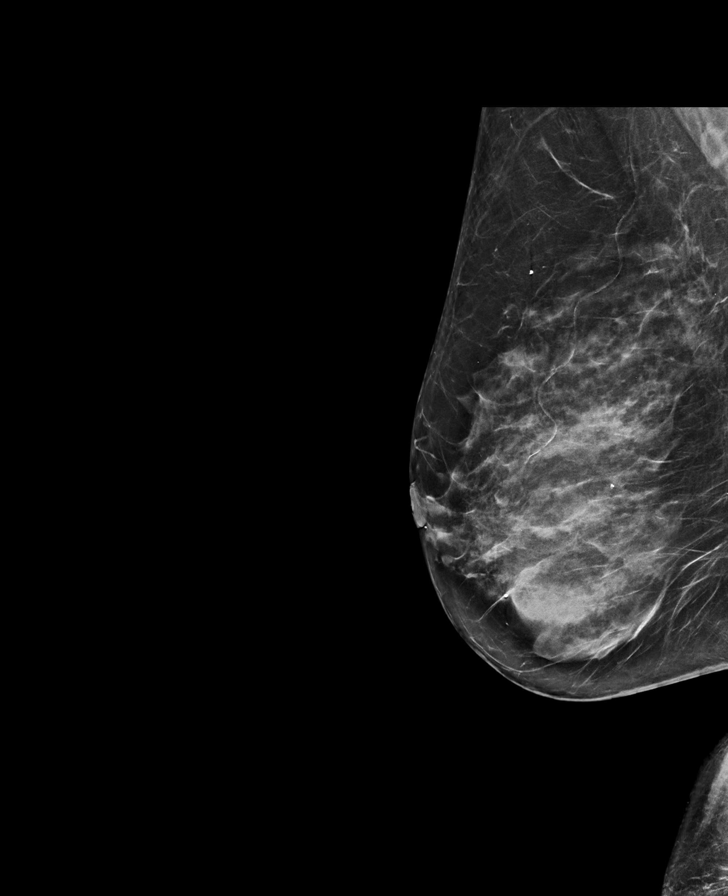

[R CC synth-2D]
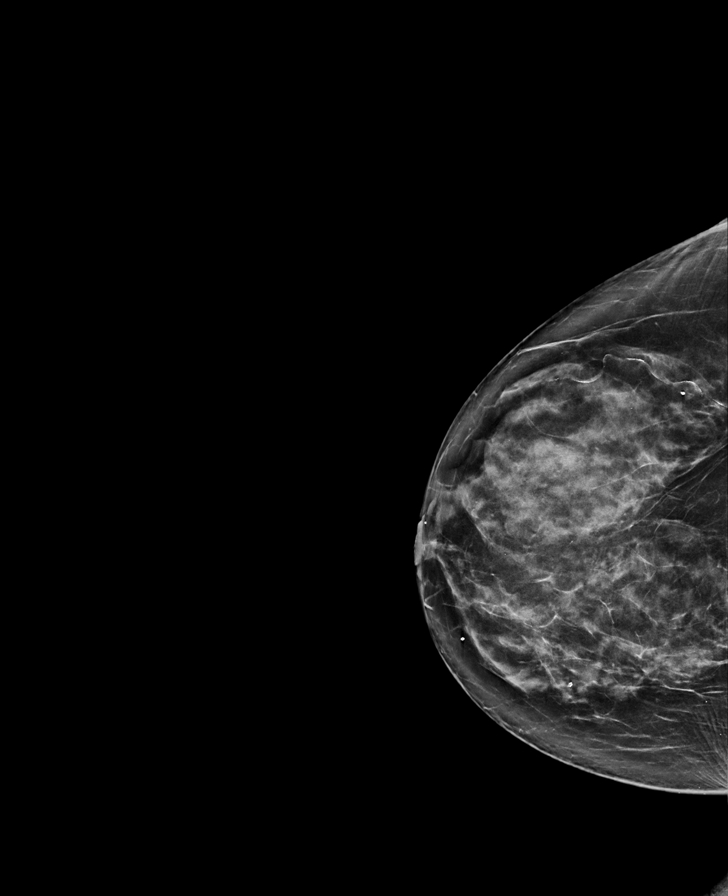

[L CC synth-2D]
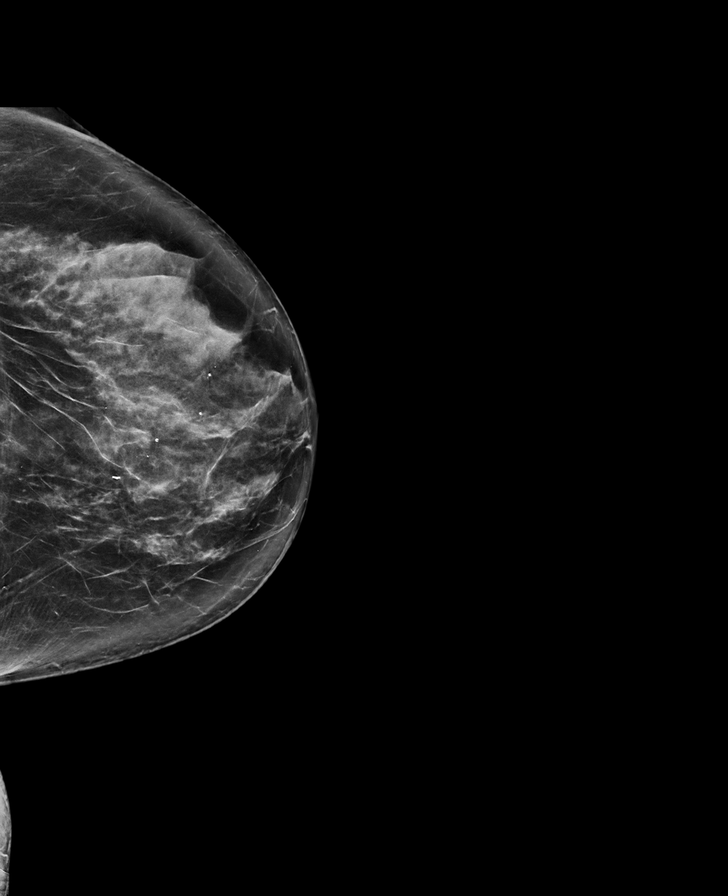

[L MLO synth-2D]
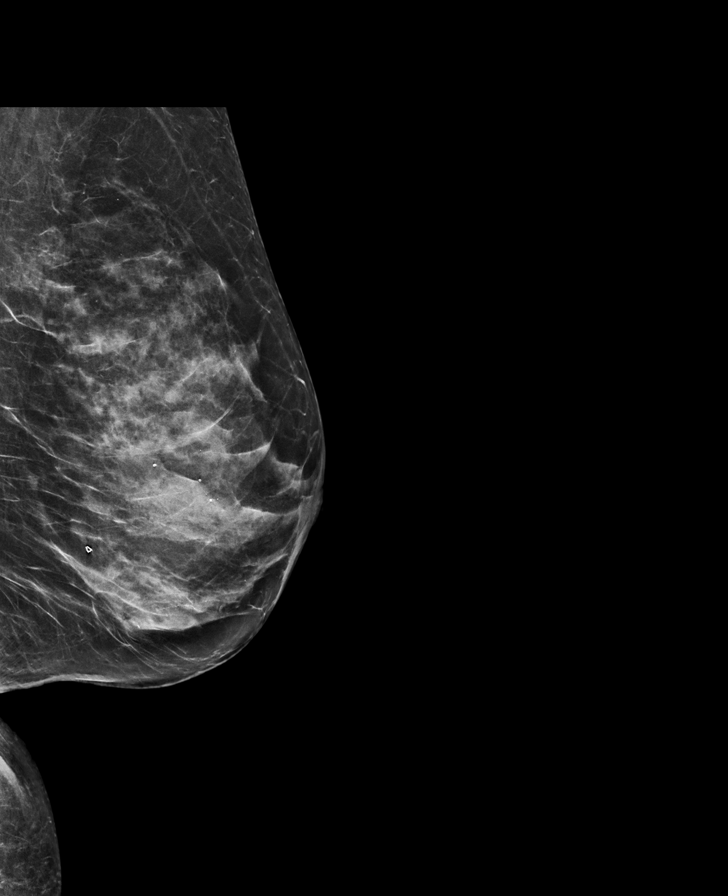

[L MLO tomo · tomo slice 33/65.0]
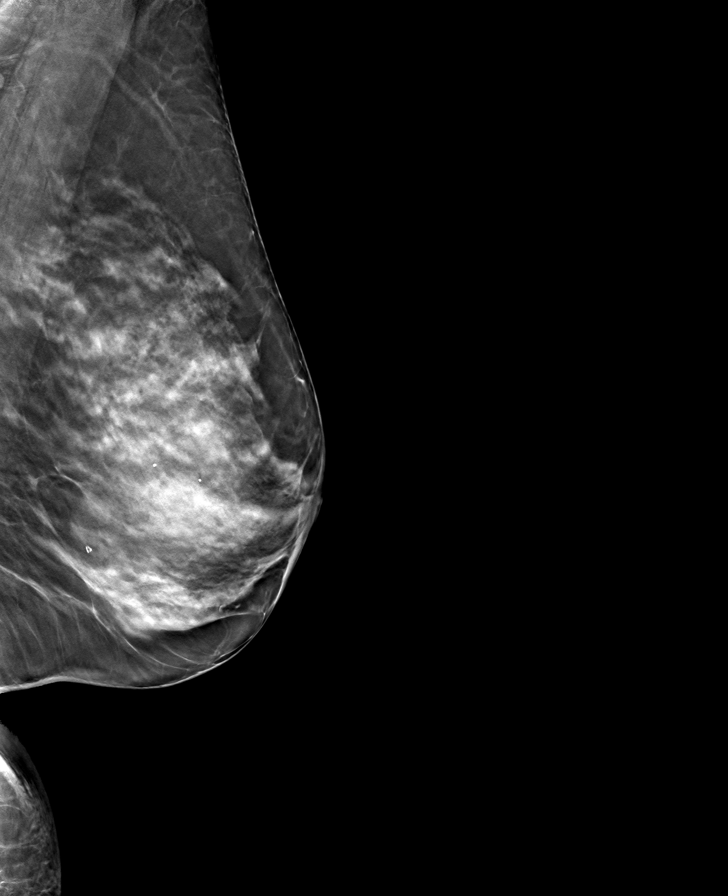

[R MLO tomo · tomo slice 37/73.0]
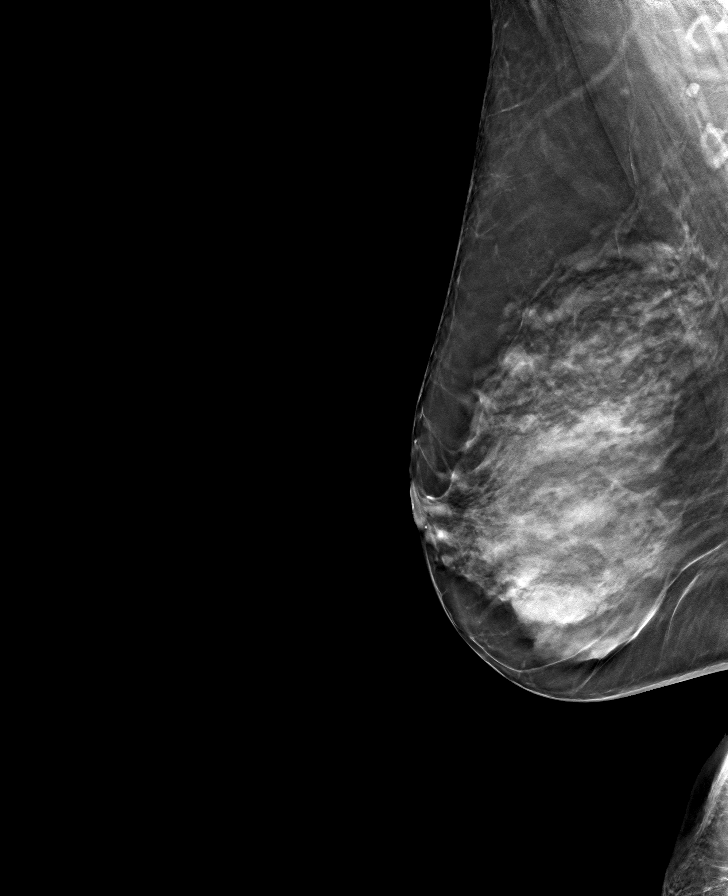

[L CC tomo · tomo slice 39/77.0]
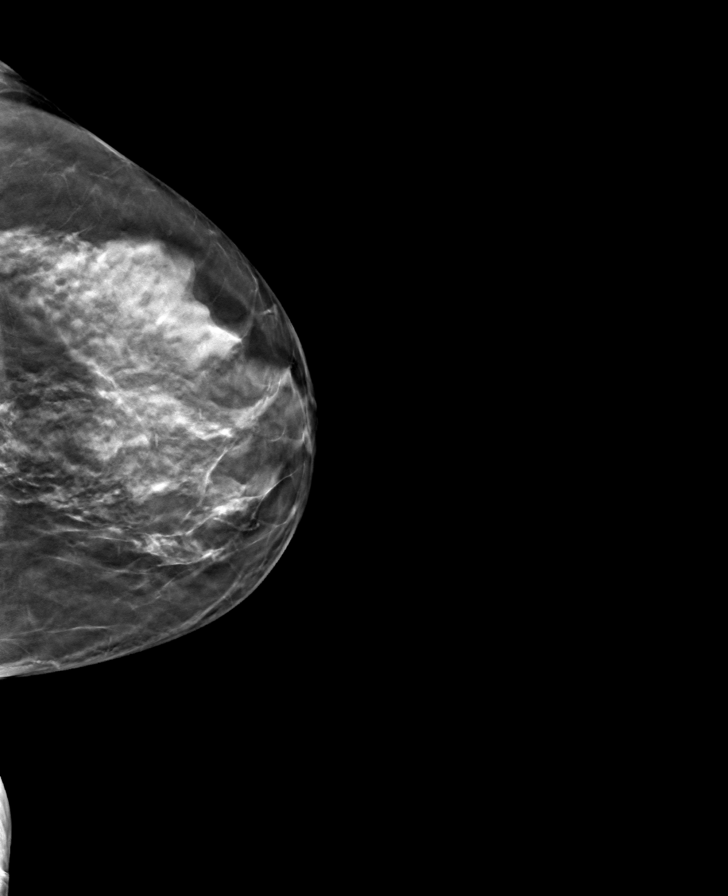

[R CC tomo · tomo slice 37/73.0]
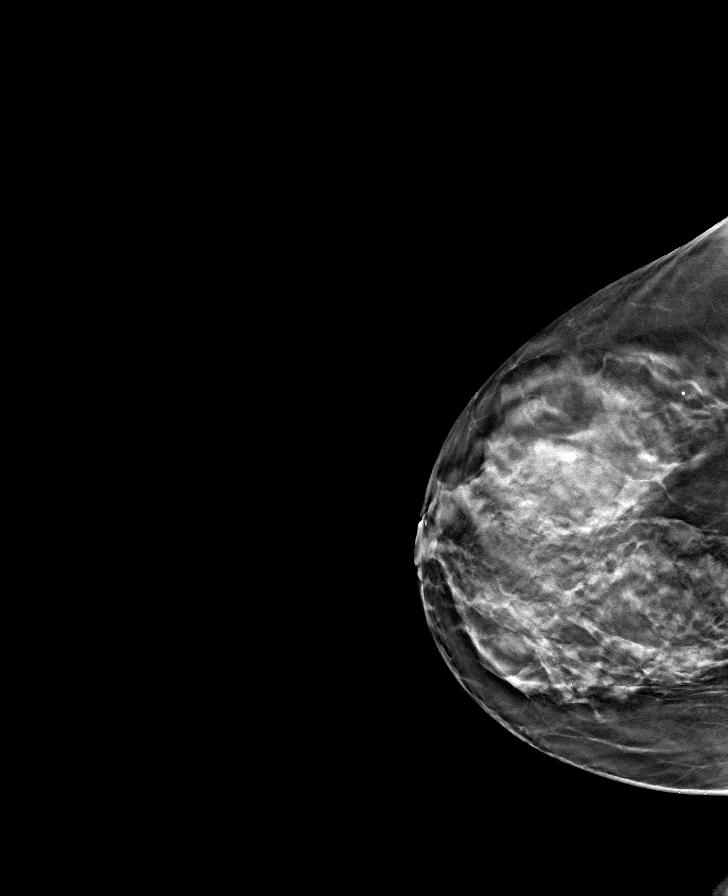

[8 of 24 positions shown; findings below may reference images not displayed]

ACR Breast Density Category c: The breast tissue is heterogeneously
dense, which may obscure small masses.
FINDINGS: There are no findings suspicious for malignancy. Images were
processed with CAD.
IMPRESSION: No mammographic evidence of malignancy. A result letter of this
screening mammogram will be mailed directly to the patient.

RECOMMENDATION:
Screening mammogram in one year. (Code:[id number]-[id number])

BI-RADS CATEGORY  1: Negative.

## 2019-12-11 ENCOUNTER — Other Ambulatory Visit: Payer: Self-pay | Admitting: Family Medicine

## 2019-12-14 ENCOUNTER — Other Ambulatory Visit: Payer: Self-pay | Admitting: Family Medicine

## 2019-12-14 DIAGNOSIS — Z1231 Encounter for screening mammogram for malignant neoplasm of breast: Secondary | ICD-10-CM

## 2020-01-03 DIAGNOSIS — R69 Illness, unspecified: Secondary | ICD-10-CM | POA: Diagnosis not present

## 2020-01-04 DIAGNOSIS — H524 Presbyopia: Secondary | ICD-10-CM | POA: Diagnosis not present

## 2020-01-07 DIAGNOSIS — Z20822 Contact with and (suspected) exposure to covid-19: Secondary | ICD-10-CM | POA: Diagnosis not present

## 2020-01-08 ENCOUNTER — Encounter: Payer: Self-pay | Admitting: Family Medicine

## 2020-01-08 ENCOUNTER — Other Ambulatory Visit (HOSPITAL_COMMUNITY): Payer: Self-pay | Admitting: Oncology

## 2020-01-08 ENCOUNTER — Telehealth: Payer: Self-pay | Admitting: Family Medicine

## 2020-01-08 ENCOUNTER — Other Ambulatory Visit: Payer: Self-pay

## 2020-01-08 ENCOUNTER — Telehealth (INDEPENDENT_AMBULATORY_CARE_PROVIDER_SITE_OTHER): Payer: Medicare HMO | Admitting: Family Medicine

## 2020-01-08 DIAGNOSIS — U071 COVID-19: Secondary | ICD-10-CM

## 2020-01-08 MED ORDER — ALBUTEROL SULFATE HFA 108 (90 BASE) MCG/ACT IN AERS: 2.0000 | INHALATION_SPRAY | Freq: Four times a day (QID) | RESPIRATORY_TRACT | 0 refills | Status: DC | PRN

## 2020-01-08 NOTE — Progress Notes (Addendum)
MyChart Video Visit    Virtual Visit via Video Note   This visit type was conducted due to national recommendations for restrictions regarding the COVID-19 Pandemic (e.g. social distancing) in an effort to limit this patient's exposure and mitigate transmission in our community. This patient is at least at moderate risk for complications without adequate follow up. This format is felt to be most appropriate for this patient at this time. Physical exam was limited by quality of the video and audio technology used for the visit.    Patient location: home Provider location: [facility] Persons involved in the visit: patient, provider   I discussed the limitations of evaluation and management by telemedicine and the availability of in person appointments. The patient expressed understanding and agreed to proceed.   Patient: Sonya Barnes   DOB: January 09, 1946   74 y.o. Female  MRN: [id number] Visit Date: 01/08/2020  Today's healthcare provider: Shirlee Latch, MD   No chief complaint on file.  Subjective    HPI   Headache, low grade fever (now resolved) Cough, congestion Slight tightness in chest, doesn't feel SOB  Husband sick with COVID as well Symptoms x7d  COVID test positive this morning    Medications: Outpatient Medications Prior to Visit  Medication Sig  . alendronate (FOSAMAX) 70 MG tablet TAKE 1 TABLET BY MOUTH ONCE WEEKLY. TAKE WITH FULL GLASS OF WATER ON AN EMPTY STOMACH  . ALPRAZolam (XANAX) 0.5 MG tablet Take 0.5-1 tablets (0.25-0.5 mg total) by mouth 2 (two) times daily as needed.  . Calcium-Vitamin D-Vitamin K (VIACTIV PO) Take by mouth daily.  . cetirizine (ZYRTEC) 10 MG tablet Take 1 tablet by mouth daily.  . famotidine (PEPCID) 20 MG tablet Take 20 mg by mouth 2 (two) times daily.  . fluticasone (FLONASE) 50 MCG/ACT nasal spray Place 2 sprays into the nose daily. 2 sprays each nostril  . gabapentin (NEURONTIN) 300 MG capsule Take 1  capsule (300 mg total) by mouth at bedtime.  Marland Kitchen levothyroxine (SYNTHROID) 75 MCG tablet TAKE 1 TABLET BY MOUTH EVERY DAY  . Multiple Vitamin (MULTIVITAMIN) tablet Take 1 tablet by mouth daily.  Marland Kitchen omeprazole (PRILOSEC) 40 MG capsule Take 1 capsule (40 mg total) by mouth daily.  Bertram Gala Glycol-Propyl Glycol (SYSTANE ULTRA OP) Apply to eye.   No facility-administered medications prior to visit.    Review of Systems  Constitutional: Positive for activity change, chills, fatigue and fever.  HENT: Positive for congestion, postnasal drip, rhinorrhea, sinus pressure, sinus pain and sore throat. Negative for hearing loss.   Respiratory: Positive for cough and chest tightness. Negative for apnea, choking, shortness of breath, wheezing and stridor.   Cardiovascular: Negative.   Musculoskeletal: Positive for myalgias.  Skin: Negative.   Psychiatric/Behavioral: Negative.       Objective    There were no vitals taken for this visit.   Physical Exam Vitals reviewed.  Constitutional:      General: She is not in acute distress.    Appearance: Normal appearance. She is not diaphoretic.  HENT:     Head: Normocephalic and atraumatic.  Eyes:     Conjunctiva/sclera: Conjunctivae normal.  Pulmonary:     Effort: Pulmonary effort is normal. No respiratory distress.  Neurological:     Mental Status: She is alert and oriented to person, place, and time. Mental status is at baseline.  Psychiatric:        Mood and Affect: Mood normal.  Behavior: Behavior normal.        Assessment & Plan     1. COVID-19 - new diagnosis this morning - h/o COVID 19 in 2020 - she is vaccinated against COVID19 - mild course and stable at this time - will refer for consideration of monoclonal Ab therapy - discussed symptomatic management and self-isolation - return/emergent precautions discussed  Return if symptoms worsen or fail to improve.     I discussed the assessment and treatment plan with the  patient. The patient was provided an opportunity to ask questions and all were answered. The patient agreed with the plan and demonstrated an understanding of the instructions.   The patient was advised to call back or seek an in-person evaluation if the symptoms worsen or if the condition fails to improve as anticipated.   I, Shirlee Latch, MD, have reviewed all documentation for this visit. The documentation on 01/08/20 for the exam, diagnosis, procedures, and orders are all accurate and complete.   Pritika Alvarez, Marzella Schlein, MD, MPH [facility]

## 2020-01-08 NOTE — Telephone Encounter (Signed)
Can set her up for virtual visit at 1pm if she would like.

## 2020-01-08 NOTE — Telephone Encounter (Signed)
Patient reports that she is having head congestion, stuffy/runny nose, dry cough, chest tightness (denies shortness of breath with and without activity), and headache. She has used Flonase, Zyrtec, and Tylenol for her symptoms. She reports that her most bothersome symptom is the dry hacking cough. She is requesting something to help with this. Denies fever. [facility] [address] Please advise. Thanks!

## 2020-01-08 NOTE — Telephone Encounter (Signed)
Pt tested positive for covid yesterday and called today for an appt. advised pt of next available date of 8.25.21Pt stated she couldn't wait that long and is in need of medication./ Pt asked to have the nurse call her about sending in medication for her symptoms / please advise

## 2020-01-08 NOTE — Progress Notes (Signed)
Re: Mab Infusion   Called to Discuss with patient about Covid symptoms and the use of regeneron, a monoclonal antibody infusion for those with mild to moderate Covid symptoms and at a high risk of hospitalization.     Pt is qualified for this infusion at the [facility] infusion center due to co-morbid conditions and/or a member of an at-risk group.     Past Medical History:  Diagnosis Date  . Allergy   . Anxiety   . Arthritis    lower spine  . Bundle branch block, right   . Degenerative disc disease, cervical    pt reports no limitations  . GERD (gastroesophageal reflux disease)   . Hypothyroidism   . Kidney stone   . Osteoporosis    back, hips  . Pre-diabetes   . Tendonitis of elbow, right    Patient has been also tested positive for Covid and is receiving Mab at [facility].  She is waiting to hear back from her husband to see if she was able to get her infusion locally in [place] at [facility].  She will call our hotline if she needs our assistance.  Mignon Pine,  AGNP-C [phone] (Infusion Center Hotline)

## 2020-01-10 DIAGNOSIS — J208 Acute bronchitis due to other specified organisms: Secondary | ICD-10-CM | POA: Diagnosis not present

## 2020-01-10 DIAGNOSIS — U071 COVID-19: Secondary | ICD-10-CM | POA: Diagnosis not present

## 2020-01-12 ENCOUNTER — Telehealth: Payer: Self-pay | Admitting: Family Medicine

## 2020-01-29 ENCOUNTER — Other Ambulatory Visit: Payer: Self-pay

## 2020-01-29 ENCOUNTER — Ambulatory Visit
Admission: RE | Admit: 2020-01-29 | Discharge: 2020-01-29 | Disposition: A | Discharge status: Home / Self Care | Payer: Medicare HMO | Source: Ambulatory Visit | Attending: Family Medicine | Admitting: Family Medicine

## 2020-01-29 DIAGNOSIS — Z1231 Encounter for screening mammogram for malignant neoplasm of breast: Secondary | ICD-10-CM | POA: Diagnosis not present

## 2020-02-02 ENCOUNTER — Telehealth: Payer: Self-pay

## 2020-02-02 NOTE — Telephone Encounter (Signed)
LMTCB 02/02/2020.  PEC please advise pt of her mammogram results.   Thanks,   -Vernona Rieger

## 2020-02-02 NOTE — Telephone Encounter (Signed)
-----   Message from Erasmo Downer, MD sent at 02/02/2020 10:17 AM EDT ----- Normal mammogram. Repeat in 1 yr

## 2020-02-02 NOTE — Telephone Encounter (Signed)
Pt given mammogram results per notes of Dr. Beryle Flock on 02/02/20. Pt verbalized understanding.

## 2020-02-06 ENCOUNTER — Ambulatory Visit (INDEPENDENT_AMBULATORY_CARE_PROVIDER_SITE_OTHER): Payer: Medicare HMO | Admitting: Family Medicine

## 2020-02-06 ENCOUNTER — Encounter: Payer: Self-pay | Admitting: Family Medicine

## 2020-02-06 ENCOUNTER — Other Ambulatory Visit: Payer: Self-pay

## 2020-02-06 VITALS — BP 123/51 | HR 61 | Temp 97.7°F | Wt 127.0 lb

## 2020-02-06 DIAGNOSIS — K648 Other hemorrhoids: Secondary | ICD-10-CM

## 2020-02-06 DIAGNOSIS — Z23 Encounter for immunization: Secondary | ICD-10-CM

## 2020-02-06 MED ORDER — HYDROCORTISONE ACETATE 25 MG RE SUPP: 25.0000 mg | Freq: Two times a day (BID) | RECTAL | 0 refills | Status: DC

## 2020-02-06 NOTE — Progress Notes (Signed)
Established patient visit   Patient: Sonya Barnes   DOB: [birth year]   74 y.o. Female  MRN: [id number] Visit Date: 02/06/2020  Today's healthcare provider: Shirlee Latch, MD   Chief Complaint  Patient presents with  . Rectal Pain   Subjective    HPI   Rectal Pain:  Started about two weeks ago.  Pt describes it as an internal pressure and discomfort.  Pt denies constipation, diarrhea, bloody stool.   Started after mAb, and viral GI in early September with a lot of diarrhea  BMs are formed but soft     Medications: Outpatient Medications Prior to Visit  Medication Sig  . alendronate (FOSAMAX) 70 MG tablet TAKE 1 TABLET BY MOUTH ONCE WEEKLY. TAKE WITH FULL GLASS OF WATER ON AN EMPTY STOMACH  . ALPRAZolam (XANAX) 0.5 MG tablet Take 0.5-1 tablets (0.25-0.5 mg total) by mouth 2 (two) times daily as needed.  . Calcium-Vitamin D-Vitamin K (VIACTIV PO) Take by mouth daily.  . famotidine (PEPCID) 20 MG tablet Take 20 mg by mouth 2 (two) times daily.  . fluticasone (FLONASE) 50 MCG/ACT nasal spray Place 2 sprays into the nose daily. 2 sprays each nostril  . gabapentin (NEURONTIN) 300 MG capsule Take 1 capsule (300 mg total) by mouth at bedtime.  Marland Kitchen levothyroxine (SYNTHROID) 75 MCG tablet TAKE 1 TABLET BY MOUTH EVERY DAY  . Multiple Vitamin (MULTIVITAMIN) tablet Take 1 tablet by mouth daily.  Marland Kitchen omeprazole (PRILOSEC) 40 MG capsule Take 1 capsule (40 mg total) by mouth daily.  Bertram Gala Glycol-Propyl Glycol (SYSTANE ULTRA OP) Apply to eye.  Marland Kitchen albuterol (VENTOLIN HFA) 108 (90 Base) MCG/ACT inhaler Inhale 2 puffs into the lungs every 6 (six) hours as needed for wheezing or shortness of breath.  . cetirizine (ZYRTEC) 10 MG tablet Take 1 tablet by mouth daily.   No facility-administered medications prior to visit.    Review of Systems  Constitutional: Negative.   Gastrointestinal: Positive for rectal pain. Negative for abdominal distention, abdominal pain, anal bleeding, blood  in stool, constipation, diarrhea, nausea and vomiting.      Objective    BP (!) 123/51 (BP Location: Left Arm, Patient Position: Sitting, Cuff Size: Large)   Pulse 61   Temp 97.7 F (36.5 C) (Oral)   Wt 127 lb (57.6 kg)   SpO2 100%   BMI 21.13 kg/m    Physical Exam Vitals reviewed.  Constitutional:      General: She is not in acute distress.    Appearance: Normal appearance. She is not diaphoretic.  Cardiovascular:     Rate and Rhythm: Normal rate and regular rhythm.  Pulmonary:     Effort: Pulmonary effort is normal. No respiratory distress.  Genitourinary:    Rectum: External hemorrhoid and internal hemorrhoid present. No mass, tenderness or anal fissure. Normal anal tone.  Neurological:     Mental Status: She is alert.  Psychiatric:        Mood and Affect: Mood normal.        Behavior: Behavior normal.      No results found for any visits on 02/06/20.  Assessment & Plan     1. Internal hemorrhoids - new problem - palpated on exam - non-bleeding - discussed need for soft and formed stools - consider adding fiber - can use hydrocortisone suppositories prn - return precautions discussed  2. Need for influenza vaccination - Flu Vaccine QUAD High Dose(Fluad)   Meds ordered this encounter  Medications  .  hydrocortisone (ANUSOL-HC) 25 MG suppository    Sig: Place 1 suppository (25 mg total) rectally 2 (two) times daily.    Dispense:  12 suppository    Refill:  0     Return if symptoms worsen or fail to improve.      I, Shirlee Latch, MD, have reviewed all documentation for this visit. The documentation on 02/06/20 for the exam, diagnosis, procedures, and orders are all accurate and complete.   Saumya Hukill, Marzella Schlein, MD, MPH [facility]

## 2020-02-06 NOTE — Patient Instructions (Signed)
Hemorrhoids Hemorrhoids are swollen veins that may develop:  In the butt (rectum). These are called internal hemorrhoids.  Around the opening of the butt (anus). These are called external hemorrhoids. Hemorrhoids can cause pain, itching, or bleeding. Most of the time, they do not cause serious problems. They usually get better with diet changes, lifestyle changes, and other home treatments. What are the causes? This condition may be caused by:  Having trouble pooping (constipation).  Pushing hard (straining) to poop.  Watery poop (diarrhea).  Pregnancy.  Being very overweight (obese).  Sitting for long periods of time.  Heavy lifting or other activity that causes you to strain.  Anal sex.  Riding a bike for a long period of time. What are the signs or symptoms? Symptoms of this condition include:  Pain.  Itching or soreness in the butt.  Bleeding from the butt.  Leaking poop.  Swelling in the area.  One or more lumps around the opening of your butt. How is this diagnosed? A doctor can often diagnose this condition by looking at the affected area. The doctor may also:  Do an exam that involves feeling the area with a gloved hand (digital rectal exam).  Examine the area inside your butt using a small tube (anoscope).  Order blood tests. This may be done if you have lost a lot of blood.  Have you get a test that involves looking inside the colon using a flexible tube with a camera on the end (sigmoidoscopy or colonoscopy). How is this treated? This condition can usually be treated at home. Your doctor may tell you to change what you eat, make lifestyle changes, or try home treatments. If these do not help, procedures can be done to remove the hemorrhoids or make them smaller. These may involve:  Placing rubber bands at the base of the hemorrhoids to cut off their blood supply.  Injecting medicine into the hemorrhoids to shrink them.  Shining a type of light  energy onto the hemorrhoids to cause them to fall off.  Doing surgery to remove the hemorrhoids or cut off their blood supply. Follow these instructions at home: Eating and drinking   Eat foods that have a lot of fiber in them. These include whole grains, beans, nuts, fruits, and vegetables.  Ask your doctor about taking products that have added fiber (fibersupplements).  Reduce the amount of fat in your diet. You can do this by: ? Eating low-fat dairy products. ? Eating less red meat. ? Avoiding processed foods.  Drink enough fluid to keep your pee (urine) pale yellow. Managing pain and swelling   Take a warm-water bath (sitz bath) for 20 minutes to ease pain. Do this 3-4 times a day. You may do this in a bathtub or using a portable sitz bath that fits over the toilet.  If told, put ice on the painful area. It may be helpful to use ice between your warm baths. ? Put ice in a plastic bag. ? Place a towel between your skin and the bag. ? Leave the ice on for 20 minutes, 2-3 times a day. General instructions  Take over-the-counter and prescription medicines only as told by your doctor. ? Medicated creams and medicines may be used as told.  Exercise often. Ask your doctor how much and what kind of exercise is best for you.  Go to the bathroom when you have the urge to poop. Do not wait.  Avoid pushing too hard when you poop.  Keep your   butt dry and clean. Use wet toilet paper or moist towelettes after pooping.  Do not sit on the toilet for a long time.  Keep all follow-up visits as told by your doctor. This is important. Contact a doctor if you:  Have pain and swelling that do not get better with treatment or medicine.  Have trouble pooping.  Cannot poop.  Have pain or swelling outside the area of the hemorrhoids. Get help right away if you have:  Bleeding that will not stop. Summary  Hemorrhoids are swollen veins in the butt or around the opening of the  butt.  They can cause pain, itching, or bleeding.  Eat foods that have a lot of fiber in them. These include whole grains, beans, nuts, fruits, and vegetables.  Take a warm-water bath (sitz bath) for 20 minutes to ease pain. Do this 3-4 times a day. This information is not intended to replace advice given to you by your health care provider. Make sure you discuss any questions you have with your health care provider. Document Revised: 05/12/2018 Document Reviewed: 09/23/2017 Elsevier Patient Education  2020 [employer]  

## 2020-02-07 ENCOUNTER — Encounter: Payer: Self-pay | Admitting: Family Medicine

## 2020-02-07 ENCOUNTER — Other Ambulatory Visit: Payer: Self-pay | Admitting: Family Medicine

## 2020-02-07 NOTE — Telephone Encounter (Signed)
hydrocortisone (ANUSOL-HC) 25 MG suppository Medication Date: 02/06/2020 Department: [facility] Ordering/Authorizing: Virginia Crews, MD   THIS RX IS NOT ON [facility] INS FORMULARY AND PRICE UNREASONABLE, RESEND TO    [place], [place] - [place] Phone:  [phone]  Fax:  [phone]

## 2020-02-08 ENCOUNTER — Encounter: Payer: Self-pay | Admitting: Family Medicine

## 2020-04-17 ENCOUNTER — Encounter: Payer: Self-pay | Admitting: Family Medicine

## 2020-04-23 DIAGNOSIS — R69 Illness, unspecified: Secondary | ICD-10-CM | POA: Diagnosis not present

## 2020-05-29 ENCOUNTER — Other Ambulatory Visit: Payer: Self-pay | Admitting: Family Medicine

## 2020-06-03 ENCOUNTER — Encounter: Payer: Medicare HMO | Admitting: Family Medicine

## 2020-06-03 ENCOUNTER — Telehealth: Payer: Self-pay

## 2020-06-03 NOTE — Telephone Encounter (Signed)
Copied from CRM [id number]. Topic: Appointment Scheduling - Scheduling Inquiry for Clinic >> Jun 03, 2020  8:36 AM Crist Infante wrote: Reason for CRM: pt calling to reschedule her cpe at 1 pm today.  Pt states her road is a sheet of ice.  First available was 08/15/20. Is that ok?  Pt states she has meds that will need to be refilled.  Please call if she can be seen sooner than that.

## 2020-07-02 ENCOUNTER — Encounter: Payer: Self-pay | Admitting: Family Medicine

## 2020-07-10 ENCOUNTER — Other Ambulatory Visit: Payer: Self-pay | Admitting: Family Medicine

## 2020-07-29 ENCOUNTER — Other Ambulatory Visit: Payer: Self-pay | Admitting: Family Medicine

## 2020-07-29 NOTE — Telephone Encounter (Signed)
Error

## 2020-07-29 NOTE — Telephone Encounter (Signed)
Requested medications are due for refill today Yes  Requested medications are on the active medication list YES  Last refill 05/06/20  Last visit 02/2019 that addressed this med/dx  Future visit scheduled 08/15/2020  Notes to clinic failed protocol of visit within 12 months and labs are over 67 days old, does have upcoming appt.

## 2020-08-05 ENCOUNTER — Other Ambulatory Visit: Payer: Self-pay | Admitting: Family Medicine

## 2020-08-05 DIAGNOSIS — K219 Gastro-esophageal reflux disease without esophagitis: Secondary | ICD-10-CM

## 2020-08-15 ENCOUNTER — Encounter: Payer: Self-pay | Admitting: Family Medicine

## 2020-08-15 ENCOUNTER — Other Ambulatory Visit: Payer: Self-pay

## 2020-08-15 ENCOUNTER — Ambulatory Visit (INDEPENDENT_AMBULATORY_CARE_PROVIDER_SITE_OTHER): Payer: Medicare HMO | Admitting: Family Medicine

## 2020-08-15 VITALS — BP 106/68 | HR 59 | Temp 98.1°F | Resp 16 | Ht 63.0 in | Wt 126.4 lb

## 2020-08-15 DIAGNOSIS — E890 Postprocedural hypothyroidism: Secondary | ICD-10-CM | POA: Diagnosis not present

## 2020-08-15 DIAGNOSIS — R69 Illness, unspecified: Secondary | ICD-10-CM | POA: Diagnosis not present

## 2020-08-15 DIAGNOSIS — R7303 Prediabetes: Secondary | ICD-10-CM

## 2020-08-15 DIAGNOSIS — F419 Anxiety disorder, unspecified: Secondary | ICD-10-CM

## 2020-08-15 DIAGNOSIS — Z0000 Encounter for general adult medical examination without abnormal findings: Secondary | ICD-10-CM | POA: Diagnosis not present

## 2020-08-15 DIAGNOSIS — M810 Age-related osteoporosis without current pathological fracture: Secondary | ICD-10-CM | POA: Diagnosis not present

## 2020-08-15 DIAGNOSIS — Z1231 Encounter for screening mammogram for malignant neoplasm of breast: Secondary | ICD-10-CM | POA: Diagnosis not present

## 2020-08-15 DIAGNOSIS — E7800 Pure hypercholesterolemia, unspecified: Secondary | ICD-10-CM

## 2020-08-15 DIAGNOSIS — K219 Gastro-esophageal reflux disease without esophagitis: Secondary | ICD-10-CM

## 2020-08-15 MED ORDER — OMEPRAZOLE 20 MG PO CPDR: 20.0000 mg | DELAYED_RELEASE_CAPSULE | Freq: Every day | ORAL | 3 refills | Status: DC

## 2020-08-15 NOTE — Assessment & Plan Note (Addendum)
Stable Low dose Xanax very sparingly

## 2020-08-15 NOTE — Patient Instructions (Addendum)
Preventive Care 75 Years and Older, Female Preventive care refers to lifestyle choices and visits with your health care provider that can promote health and wellness. This includes:  A yearly physical exam. This is also called an annual wellness visit.  Regular dental and eye exams.  Immunizations.  Screening for certain conditions.  Healthy lifestyle choices, such as: ? Eating a healthy diet. ? Getting regular exercise. ? Not using drugs or products that contain nicotine and tobacco. ? Limiting alcohol use. What can I expect for my preventive care visit? Physical exam Your health care provider will check your:  Height and weight. These may be used to calculate your BMI (body mass index). BMI is a measurement that tells if you are at a healthy weight.  Heart rate and blood pressure.  Body temperature.  Skin for abnormal spots. Counseling Your health care provider may ask you questions about your:  Past medical problems.  Family's medical history.  Alcohol, tobacco, and drug use.  Emotional well-being.  Home life and relationship well-being.  Sexual activity.  Diet, exercise, and sleep habits.  History of falls.  Memory and ability to understand (cognition).  Work and work Statistician.  Pregnancy and menstrual history.  Access to firearms. What immunizations do I need? Vaccines are usually given at various ages, according to a schedule. Your health care provider will recommend vaccines for you based on your age, medical history, and lifestyle or other factors, such as travel or where you work.   What tests do I need? Blood tests  Lipid and cholesterol levels. These may be checked every 5 years, or more often depending on your overall health.  Hepatitis C test.  Hepatitis B test. Screening  Lung cancer screening. You may have this screening every year starting at age 75 if you have a 30-pack-year history of smoking and currently smoke or have quit within  the past 15 years.  Colorectal cancer screening. ? All adults should have this screening starting at age 44 and continuing until age 75. ? Your health care provider may recommend screening at age 2 if you are at increased risk. ? You will have tests every 1-10 years, depending on your results and the type of screening test.  Diabetes screening. ? This is done by checking your blood sugar (glucose) after you have not eaten for a while (fasting). ? You may have this done every 1-3 years.  Mammogram. ? This may be done every 1-2 years. ? Talk with your health care provider about how often you should have regular mammograms.  Abdominal aortic aneurysm (AAA) screening. You may need this if you are a current or former smoker.  BRCA-related cancer screening. This may be done if you have a family history of breast, ovarian, tubal, or peritoneal cancers. Other tests  STD (sexually transmitted disease) testing, if you are at risk.  Bone density scan. This is done to screen for osteoporosis. You may have this done starting at age 75. Talk with your health care provider about your test results, treatment options, and if necessary, the need for more tests. Follow these instructions at home: Eating and drinking  Eat a diet that includes fresh fruits and vegetables, whole grains, lean protein, and low-fat dairy products. Limit your intake of foods with high amounts of sugar, saturated fats, and salt.  Take vitamin and mineral supplements as recommended by your health care provider.  Do not drink alcohol if your health care provider tells you not to drink.  If you drink alcohol: ? Limit how much you have to 0-1 drink a day. ? Be aware of how much alcohol is in your drink. In the [place] one drink equals one 12 oz bottle of beer (355 mL), one 5 oz glass of wine (148 mL), or one 1 oz glass of hard liquor (44 mL).   Lifestyle  Take daily care of your teeth and gums. Brush your teeth every morning  and night with fluoride toothpaste. Floss one time each day.  Stay active. Exercise for at least 30 minutes 5 or more days each week.  Do not use any products that contain nicotine or tobacco, such as cigarettes, e-cigarettes, and chewing tobacco. If you need help quitting, ask your health care provider.  Do not use drugs.  If you are sexually active, practice safe sex. Use a condom or other form of protection in order to prevent STIs (sexually transmitted infections).  Talk with your health care provider about taking a low-dose aspirin or statin.  Find healthy ways to cope with stress, such as: ? Meditation, yoga, or listening to music. ? Journaling. ? Talking to a trusted person. ? Spending time with friends and family. Safety  Always wear your seat belt while driving or riding in a vehicle.  Do not drive: ? If you have been drinking alcohol. Do not ride with someone who has been drinking. ? When you are tired or distracted. ? While texting.  Wear a helmet and other protective equipment during sports activities.  If you have firearms in your house, make sure you follow all gun safety procedures. What's next?  Visit your health care provider once a year for an annual wellness visit.  Ask your health care provider how often you should have your eyes and teeth checked.  Stay up to date on all vaccines. This information is not intended to replace advice given to you by your health care provider. Make sure you discuss any questions you have with your health care provider. Document Revised: 04/24/2020 Document Reviewed: 04/28/2018 Elsevier Patient Education  2021 Elsevier Inc.    Ileus  Ileus is a condition that happens when the intestines, which are also called bowels, stop working correctly. The intestines are hollow organs that digest food after the food leaves the stomach. These organs are long, muscular tubes that connect the stomach to the rectum. When ileus occurs, the  muscular contractions that cause food to move through the intestines do not happen as they normally would. If the intestines stop working, food cannot pass through to get digested. This condition is a serious problem that usually requires hospitalization. It can cause symptoms such as nausea, abdominal pain, and bloating. Ileus can last from a few hours to a few days. What are the causes? This condition may be caused by:  Surgery on the abdomen.  An infection or inflammation in the abdomen. This includes inflammation of the lining of the abdomen (peritonitis).  Infection or inflammation in other parts of the body, such as pneumonia or pancreatitis.  Passage of gallstones or kidney stones.  Damage to the nerves or blood vessels that go to the intestines.  A collection of blood within the abdominal cavity.  Imbalance in the salts in the blood (electrolytes).  Injury to the brain or spinal cord.  Medicines. Many medicines, including strong pain medicines, can cause ileus or make it worse. If the intestines stop working because of a blockage, that is a different condition that is called a  bowel obstruction. What are the signs or symptoms? Symptoms of this condition include:  Bloating of the abdomen.  Pain or discomfort in the abdomen.  Poor appetite.  Nausea and vomiting.  Lack of normal bowel sounds, such as "growling" in the stomach. How is this diagnosed? This condition may be diagnosed with:  A physical exam and medical history.  X-rays or a CT scan of the abdomen. You may also have other tests to help find the cause of the condition. How is this treated? This condition may be treated by:  Resting the intestines until they start to work again. This is often done by: ? Stopping oral intake of food and drink. You will be given fluid through an IV to prevent dehydration. ? Placing a small tube (nasogastric tube or NG tube) that is passed through your nose and into your  stomach. The tube is attached to a suction device and keeps the stomach emptied out. This allows the bowels to rest and helps to reduce nausea and vomiting.  Correcting any electrolyte imbalance by giving supplements in the IV fluid.  Stopping any medicines that might make ileus worse.  Treating any condition that may have caused ileus. Follow these instructions at home: Eating and drinking  Follow instructions from your health care provider about: ? What to eat and drink. You may be told to start eating a bland diet. Over time, you may slowly resume a more normal, healthy diet. ? How much to eat and drink. You should eat small meals often and stop eating when you feel full.  Avoid alcohol.   General instructions  Take over-the-counter and prescription medicines only as told by your health care provider.  Rest as told by your health care provider.  Avoid sitting for a long time without moving. Get up to take short walks every 1-2 hours. Ask for help if you feel weak or unsteady.  Keep all follow-up visits as told by your health care provider. This is important. Contact a health care provider if:  You have nausea, vomiting, or abdominal discomfort.  You have a fever. Get help right away if:  You have severe abdominal pain or bloating.  You cannot eat or drink without vomiting. Summary  Ileus is a condition that happens when the intestines, which are also called bowels, stop working correctly.  When ileus occurs, the muscular contractions that cause food to move through the intestines do not happen as they normally would.  Ileus can cause symptoms such as nausea, abdominal pain, and bloating.  Treatment may involve getting IV fluids and having a nasogastric tube placed to keep your stomach emptied out until the intestines start working again. This information is not intended to replace advice given to you by your health care provider. Make sure you discuss any questions you  have with your health care provider. Document Revised: 08/30/2017 Document Reviewed: 08/30/2017 Elsevier Patient Education  [place].

## 2020-08-15 NOTE — Assessment & Plan Note (Signed)
Doing well on fosamax - started in 12/2015 Repeat DEXA Plan to do drug holiday at 5 yr mark pending DEXA

## 2020-08-15 NOTE — Assessment & Plan Note (Signed)
Stable and previously well controlled with diet and exercise Encourage low-carb diet Recheck A1c

## 2020-08-15 NOTE — Assessment & Plan Note (Signed)
Not on statin Recheck lipid panel and CMP Use ASCVD risk to determine need for statin

## 2020-08-15 NOTE — Assessment & Plan Note (Signed)
Discussed risks of longterm PPI, esp in setting of osteoporosis Will attempt to decrease dose to 20mg  daily

## 2020-08-15 NOTE — Assessment & Plan Note (Signed)
Previously well controlled Continue Synthroid at current dose  Recheck TSH and adjust Synthroid as indicated   

## 2020-08-15 NOTE — Progress Notes (Signed)
Annual Wellness Visit     Patient: Sonya Barnes, Female    DOB: [birth year], 75 y.o.   MRN: [id number] Visit Date: 08/15/2020  Today's Provider: Shirlee Latch, MD   Chief Complaint  Patient presents with  . Medicare Wellness   Subjective    Sonya Barnes is a 75 y.o. female who presents today for her Annual Wellness Visit. She reports consuming a general diet. Gym/ health club routine includes cardio, low impact aerobics and walking on track . She generally feels well. She reports sleeping well. She does have additional problems to discuss today.   HPI 10/23/15 Pap-negative 02/01/20 Mammogram-BI-RADS 1 02/17/18 MBD-Osteoporosis 02/01/17 Colonoscopy-Diverticulosis, repeat in 5 years  Patient Active Problem List   Diagnosis Date Noted  . Problems with swallowing and mastication   . Gastric polyp   . Personal history of colonic polyps   . Healthcare maintenance 01/06/2017  . Hypothyroidism associated with surgical procedure 01/07/2016  . Acid reflux 06/10/2015  . Abnormal ECG 09/07/2014  . Allergic rhinitis 09/07/2014  . Anxiety 09/07/2014  . Arthritis 09/07/2014  . Clinical depression 09/07/2014  . Prediabetes 09/07/2014  . Cannot sleep 09/07/2014  . Osteoporosis 10/16/2008  . Big thyroid 10/16/2008  . Hypercholesteremia 10/16/2008   Past Surgical History:  Procedure Laterality Date  . BREAST BIOPSY Left 15 years ago   Core BX of calcs. Negtive  . BREAST CYST ASPIRATION Right 15 years ago   Negative  . BREAST SURGERY Left    biopsy  . COLONOSCOPY N/A 02/01/2017   Procedure: COLONOSCOPY;  Surgeon: Midge Minium, MD;  Location[facility];  Service: Gastroenterology;  Laterality: N/A;  . DILATION AND CURETTAGE OF UTERUS  1956-57  . ESOPHAGEAL DILATION N/A 02/01/2017   Procedure: ESOPHAGEAL DILATION;  Surgeon: Midge Minium, MD;  Location: [facility];  Service: Gastroenterology;  Laterality: N/A;  . ESOPHAGOGASTRODUODENOSCOPY N/A 02/01/2017    Procedure: ESOPHAGOGASTRODUODENOSCOPY (EGD);  Surgeon: Midge Minium, MD;  Location: [facility];  Service: Gastroenterology;  Laterality: N/A;  . HAMMER TOE SURGERY Bilateral   . OVARIAN CYST REMOVAL  1967  . THYROIDECTOMY, PARTIAL    . TONSILLECTOMY     Social History   Socioeconomic History  . Marital status: Married    Spouse name: Kathlene November  . Number of children: 2  . Years of education: associates  . Highest education level: Not on file  Occupational History  . Occupation: retired    Associate Professor: [employer]  Tobacco Use  . Smoking status: Never Smoker  . Smokeless tobacco: Never Used  Vaping Use  . Vaping Use: Never used  Substance and Sexual Activity  . Alcohol use: No    Alcohol/week: 0.0 standard drinks  . Drug use: No  . Sexual activity: Yes  Other Topics Concern  . Not on file  Social History Narrative  . Not on file   Social Determinants of Health   Financial Resource Strain: Not on file  Food Insecurity: Not on file  Transportation Needs: Not on file  Physical Activity: Not on file  Stress: Not on file  Social Connections: Not on file  Intimate Partner Violence: Not on file   Family History  Problem Relation Age of Onset  . Arthritis Mother   . Cancer Mother        lung  . Lung disease Mother   . Osteoporosis Mother   . Diabetes Father   . Stroke Father   . Heart disease Father   . Heart  disease Sister   . Lung disease Sister   . Osteoporosis Sister   . Colon polyps Sister   . Early death Brother   . Mental illness Brother   . Liver disease Brother   . Breast cancer Neg Hx    No Known Allergies     Medications: Outpatient Medications Prior to Visit  Medication Sig  . alendronate (FOSAMAX) 70 MG tablet TAKE 1 TABLET BY MOUTH ONCE WEEKLY. TAKE WITH FULL GLASS OF WATER ON AN EMPTY STOMACH  . ALPRAZolam (XANAX) 0.5 MG tablet Take 0.5-1 tablets (0.25-0.5 mg total) by mouth 2 (two) times daily as needed.  . Calcium-Vitamin D-Vitamin K (VIACTIV PO)  Take by mouth daily.  . famotidine (PEPCID) 20 MG tablet Take 20 mg by mouth 2 (two) times daily.  . fluticasone (FLONASE) 50 MCG/ACT nasal spray Place 2 sprays into the nose daily. 2 sprays each nostril  . gabapentin (NEURONTIN) 300 MG capsule Take 1 capsule (300 mg total) by mouth at bedtime.  Marland Kitchen levothyroxine (SYNTHROID) 75 MCG tablet TAKE 1 TABLET BY MOUTH EVERY DAY  . Multiple Vitamin (MULTIVITAMIN) tablet Take 1 tablet by mouth daily.  Bertram Gala Glycol-Propyl Glycol (SYSTANE ULTRA OP) Apply to eye.  . [DISCONTINUED] omeprazole (PRILOSEC) 40 MG capsule TAKE 1 CAPSULE BY MOUTH EVERY DAY  . [DISCONTINUED] hydrocortisone (ANUSOL-HC) 25 MG suppository Place 1 suppository (25 mg total) rectally 2 (two) times daily.   No facility-administered medications prior to visit.    No Known Allergies  Patient Care Team: Erasmo Downer, MD as PCP - General (Family Medicine)  Review of Systems  Constitutional: Negative.   HENT: Negative.   Eyes: Negative.   Respiratory: Negative.   Cardiovascular: Negative.   Gastrointestinal: Negative.   Endocrine: Negative.   Genitourinary: Negative.   Musculoskeletal: Negative.   Skin: Negative.   Allergic/Immunologic: Negative.   Neurological: Negative.   Hematological: Negative.   Psychiatric/Behavioral: Negative.     Last CBC Lab Results  Component Value Date   WBC 6.1 02/23/2019   HGB 13.5 02/23/2019   HCT 39.6 02/23/2019   MCV 89 02/23/2019   MCH 30.2 02/23/2019   RDW 13.0 02/23/2019   PLT 328 02/23/2019   Last metabolic panel Lab Results  Component Value Date   GLUCOSE 86 02/23/2019   NA 140 02/23/2019   K 4.0 02/23/2019   CL 101 02/23/2019   CO2 25 02/23/2019   BUN 17 02/23/2019   CREATININE 0.89 02/23/2019   GFRNONAA 65 02/23/2019   GFRAA 74 02/23/2019   CALCIUM 9.5 02/23/2019   PROT 6.1 02/23/2019   ALBUMIN 4.2 02/23/2019   LABGLOB 1.9 02/23/2019   AGRATIO 2.2 02/23/2019   BILITOT 0.3 02/23/2019   ALKPHOS 80  02/23/2019   AST 26 02/23/2019   ALT 13 02/23/2019   ANIONGAP 7 01/11/2017   Last lipids Lab Results  Component Value Date   CHOL 225 (H) 02/23/2019   HDL 66 02/23/2019   LDLCALC 138 (H) 02/23/2019   TRIG 118 02/23/2019   CHOLHDL 3.4 02/23/2019   Last hemoglobin A1c Lab Results  Component Value Date   HGBA1C 5.5 08/29/2019   Last thyroid functions Lab Results  Component Value Date   TSH 0.507 08/29/2019   T4TOTAL 8.2 01/07/2016   Last vitamin D Lab Results  Component Value Date   VD25OH 42.2 01/07/2016   Last vitamin B12 and Folate No results found for: VITAMINB12, FOLATE      Objective    Vitals: BP 106/68 (BP  Location: Left Arm, Patient Position: Sitting, Cuff Size: Normal)   Pulse (!) 59   Temp 98.1 F (36.7 C) (Oral)   Resp 16   Ht 5\' 3"  (1.6 m)   Wt 126 lb 6.4 oz (57.3 kg)   SpO2 99%   BMI 22.39 kg/m  BP Readings from Last 3 Encounters:  08/15/20 106/68  02/06/20 (!) 123/51  08/29/19 (!) 100/57   Wt Readings from Last 3 Encounters:  08/15/20 126 lb 6.4 oz (57.3 kg)  02/06/20 127 lb (57.6 kg)  08/29/19 130 lb (59 kg)      Physical Exam Vitals reviewed.  Constitutional:      General: She is not in acute distress.    Appearance: Normal appearance. She is well-developed. She is not diaphoretic.  HENT:     Head: Normocephalic and atraumatic.  Eyes:     General: No scleral icterus.    Conjunctiva/sclera: Conjunctivae normal.  Neck:     Thyroid: No thyromegaly.  Cardiovascular:     Rate and Rhythm: Normal rate and regular rhythm.     Pulses: Normal pulses.     Heart sounds: Normal heart sounds. No murmur heard.   Pulmonary:     Effort: Pulmonary effort is normal. No respiratory distress.     Breath sounds: Normal breath sounds. No wheezing, rhonchi or rales.  Abdominal:     General: There is no distension.     Palpations: Abdomen is soft.     Tenderness: There is no abdominal tenderness.  Musculoskeletal:     Cervical back: Neck  supple.     Right lower leg: No edema.     Left lower leg: No edema.  Lymphadenopathy:     Cervical: No cervical adenopathy.  Skin:    General: Skin is warm and dry.     Findings: No rash.  Neurological:     Mental Status: She is alert and oriented to person, place, and time. Mental status is at baseline.  Psychiatric:        Mood and Affect: Mood normal.        Behavior: Behavior normal.      Most recent functional status assessment: In your present state of health, do you have any difficulty performing the following activities: 08/15/2020  Hearing? N  Vision? Y  Difficulty concentrating or making decisions? N  Walking or climbing stairs? N  Dressing or bathing? N  Doing errands, shopping? N  Some recent data might be hidden   Most recent fall risk assessment: Fall Risk  08/15/2020  Falls in the past year? 0  Number falls in past yr: 0  Injury with Fall? 0  Risk for fall due to : No Fall Risks  Follow up Falls evaluation completed    Most recent depression screenings: PHQ 2/9 Scores 08/15/2020 02/06/2020  PHQ - 2 Score 0 1  PHQ- 9 Score 0 5   Most recent cognitive screening: 6CIT Screen 02/23/2019  What Year? 0 points  What month? 0 points  What time? 0 points  Count back from 20 0 points  Months in reverse 0 points  Repeat phrase 0 points  Total Score 0   Most recent Audit-C alcohol use screening Alcohol Use Disorder Test (AUDIT) 08/15/2020  1. How often do you have a drink containing alcohol? 0  2. How many drinks containing alcohol do you have on a typical day when you are drinking? 0  3. How often do you have six or more drinks on one  occasion? 0  AUDIT-C Score 0  Alcohol Brief Interventions/Follow-up AUDIT Score <7 follow-up not indicated   A score of 3 or more in women, and 4 or more in men indicates increased risk for alcohol abuse, EXCEPT if all of the points are from question 1   No results found for any visits on 08/15/20.  Assessment & Plan      Annual wellness visit done today including the all of the following: Reviewed patient's Family Medical History Reviewed and updated list of patient's medical providers Assessment of cognitive impairment was done Assessed patient's functional ability Established a written schedule for health screening services Health Risk Assessent Completed and Reviewed  Exercise Activities and Dietary recommendations Goals    . Exercise 150 minutes per week (moderate activity)    . Increase water intake     Recommend increasing water intake to 6-8 glasses of water a day.        Immunization History  Administered Date(s) Administered  . DTaP 05/18/1996  . Fluad Quad(high Dose 65+) 02/23/2019, 02/06/2020  . Influenza, High Dose Seasonal PF 02/27/2016, 02/11/2017, 01/21/2018  . Influenza-Unspecified 01/17/2015  . PFIZER(Purple Top)SARS-COV-2 Vaccination 06/23/2019, 07/17/2019  . Pneumococcal Conjugate-13 01/26/2014  . Pneumococcal Polysaccharide-23 02/15/2015  . Td 02/23/2019  . Zoster 12/24/2010    Health Maintenance  Topic Date Due  . COVID-19 Vaccine (3 - Booster for Pfizer series) 01/17/2020  . MAMMOGRAM  01/28/2022  . COLONOSCOPY (Pts 45-81yrs Insurance coverage will need to be confirmed)  02/01/2022  . TETANUS/TDAP  02/22/2029  . INFLUENZA VACCINE  Completed  . DEXA SCAN  Completed  . Hepatitis C Screening  Completed  . PNA vac Low Risk Adult  Completed  . HPV VACCINES  Aged Out     Discussed health benefits of physical activity, and encouraged her to engage in regular exercise appropriate for her age and condition.    Problem List Items Addressed This Visit      Digestive   Acid reflux    Discussed risks of longterm PPI, esp in setting of osteoporosis Will attempt to decrease dose to  daily      Relevant Medications   omeprazole (PRILOSEC) 20 MG capsule     Endocrine   Hypothyroidism associated with surgical procedure    Previously well controlled Continue  Synthroid at current dose  Recheck TSH and adjust Synthroid as indicated        Relevant Orders   TSH     Musculoskeletal and Integument   Osteoporosis    Doing well on fosamax - started in 12/2015 Repeat DEXA Plan to do drug holiday at 5 yr mark pending DEXA      Relevant Orders   DG Bone Density     Other   Anxiety    Stable Low dose Xanax very sparingly      Hypercholesteremia    Not on statin Recheck lipid panel and CMP Use ASCVD risk to determine need for statin      Relevant Orders   Lipid Panel With LDL/HDL Ratio   Prediabetes    Stable and previously well controlled with diet and exercise Encourage low-carb diet Recheck A1c      Relevant Orders   Hemoglobin A1c    Other Visit Diagnoses    Medicare annual wellness visit, subsequent    -  Primary   Relevant Orders   Comprehensive metabolic panel   Hemoglobin A1c   Lipid Panel With LDL/HDL Ratio   TSH   Encounter for  annual physical exam       Screening mammogram for breast cancer       Relevant Orders   MM 3D SCREEN BREAST BILATERAL       Return in about 6 months (around 02/14/2021) for chronic disease f/u.     I, Shirlee Latch, MD, have reviewed all documentation for this visit. The documentation on 08/15/20 for the exam, diagnosis, procedures, and orders are all accurate and complete.   Norwood Quezada, Marzella Schlein, MD, MPH [facility]

## 2020-08-16 LAB — COMPREHENSIVE METABOLIC PANEL
ALT: 10 IU/L (ref 0–32)
AST: 18 IU/L (ref 0–40)
Albumin/Globulin Ratio: 2 (ref 1.2–2.2)
Albumin: 4.2 g/dL (ref 3.7–4.7)
Alkaline Phosphatase: 74 IU/L (ref 44–121)
BUN/Creatinine Ratio: 19 (ref 12–28)
BUN: 14 mg/dL (ref 8–27)
Bilirubin Total: 0.2 mg/dL (ref 0.0–1.2)
CO2: 25 mmol/L (ref 20–29)
Calcium: 9.7 mg/dL (ref 8.7–10.3)
Chloride: 99 mmol/L (ref 96–106)
Creatinine, Ser: 0.75 mg/dL (ref 0.57–1.00)
Globulin, Total: 2.1 g/dL (ref 1.5–4.5)
Glucose: 92 mg/dL (ref 65–99)
Potassium: 4.3 mmol/L (ref 3.5–5.2)
Sodium: 140 mmol/L (ref 134–144)
Total Protein: 6.3 g/dL (ref 6.0–8.5)
eGFR: 83 mL/min/{1.73_m2} (ref >59)

## 2020-08-16 LAB — LIPID PANEL WITH LDL/HDL RATIO
Cholesterol, Total: 224 mg/dL — ABNORMAL HIGH (ref 100–199)
HDL: 55 mg/dL (ref >39)
LDL Chol Calc (NIH): 144 mg/dL — ABNORMAL HIGH (ref 0–99)
LDL/HDL Ratio: 2.6 ratio (ref 0.0–3.2)
Triglycerides: 140 mg/dL (ref 0–149)
VLDL Cholesterol Cal: 25 mg/dL (ref 5–40)

## 2020-08-16 LAB — TSH: TSH: 0.526 u[IU]/mL (ref 0.450–4.500)

## 2020-08-16 LAB — HEMOGLOBIN A1C
Est. average glucose Bld gHb Est-mCnc: 117 mg/dL
Hgb A1c MFr Bld: 5.7 % — ABNORMAL HIGH (ref 4.8–5.6)

## 2020-08-19 ENCOUNTER — Telehealth: Payer: Self-pay

## 2020-08-19 NOTE — Telephone Encounter (Signed)
-----   Message from Erasmo Downer, MD sent at 08/16/2020  8:13 AM EDT ----- Normal/stable labs. Hemoglobin A1c, 3 month avg of blood sugars, is in prediabetic range.  In order to prevent progression to diabetes, recommend low carb diet and regular exercise.

## 2020-08-19 NOTE — Telephone Encounter (Signed)
Patient was advised and verbalized understanding. Also she states that he blood sugar has been in prediabetes range for 10 years now and she working on low carb diet.

## 2020-09-03 ENCOUNTER — Other Ambulatory Visit: Payer: Self-pay | Admitting: Family Medicine

## 2020-09-03 NOTE — Telephone Encounter (Signed)
Requested Prescriptions  Pending Prescriptions Disp Refills  . gabapentin (NEURONTIN) 300 MG capsule [Pharmacy Med Name: GABAPENTIN 300 MG CAPSULE] 90 capsule 3    Sig: Take 1 capsule (300 mg total) by mouth at bedtime.     Neurology: Anticonvulsants - gabapentin Passed - 09/03/2020  6:05 PM      Passed - Valid encounter within last 12 months    Recent Outpatient Visits          2 weeks ago Medicare annual wellness visit, subsequent   [facility] [place], Marzella Schlein, MD   7 months ago Internal hemorrhoids   [facility] [place], Marzella Schlein, MD   7 months ago COVID-19   [facility], Marzella Schlein, MD   1 year ago Prediabetes   [facility] [place], Marzella Schlein, MD   1 year ago Throat tightness   [facility] Trey Sailors, [place]      Future Appointments            In 5 months Bacigalupo, Marzella Schlein, MD [facility]

## 2020-10-05 ENCOUNTER — Other Ambulatory Visit: Payer: Self-pay | Admitting: Family Medicine

## 2020-10-05 NOTE — Telephone Encounter (Signed)
Requested Prescriptions  Pending Prescriptions Disp Refills  . levothyroxine (SYNTHROID) 75 MCG tablet [Pharmacy Med Name: LEVOTHYROXINE 75 MCG TABLET] 90 tablet 0    Sig: TAKE 1 TABLET BY MOUTH EVERY DAY     Endocrinology:  Hypothyroid Agents Failed - 10/05/2020  9:35 AM      Failed - TSH needs to be rechecked within 3 months after an abnormal result. Refill until TSH is due.      Passed - TSH in normal range and within 360 days    TSH  Date Value Ref Range Status  08/15/2020 0.526 0.450 - 4.500 uIU/mL Final         Passed - Valid encounter within last 12 months    Recent Outpatient Visits          1 month ago Medicare annual wellness visit, subsequent   [facility] [place], Marzella Schlein, MD   8 months ago Internal hemorrhoids   [facility] [place], Marzella Schlein, MD   9 months ago COVID-19   [facility], Marzella Schlein, MD   1 year ago Prediabetes   [facility] [place], Marzella Schlein, MD   1 year ago Throat tightness   [facility] Trey Sailors, [place]      Future Appointments            In 4 months Bacigalupo, Marzella Schlein, MD [facility]

## 2021-01-05 ENCOUNTER — Other Ambulatory Visit: Payer: Self-pay | Admitting: Family Medicine

## 2021-01-05 NOTE — Telephone Encounter (Signed)
Requested Prescriptions  Pending Prescriptions Disp Refills  . levothyroxine (SYNTHROID) 75 MCG tablet [Pharmacy Med Name: LEVOTHYROXINE 75 MCG TABLET] 90 tablet 0    Sig: TAKE 1 TABLET BY MOUTH EVERY DAY     Endocrinology:  Hypothyroid Agents Failed - 01/05/2021  9:03 AM      Failed - TSH needs to be rechecked within 3 months after an abnormal result. Refill until TSH is due.      Passed - TSH in normal range and within 360 days    TSH  Date Value Ref Range Status  08/15/2020 0.526 0.450 - 4.500 uIU/mL Final         Passed - Valid encounter within last 12 months    Recent Outpatient Visits          4 months ago Medicare annual wellness visit, subsequent   [facility] [place], Marzella Schlein, MD   11 months ago Internal hemorrhoids   [facility] [place], Marzella Schlein, MD   12 months ago COVID-19   [facility], Marzella Schlein, MD   1 year ago Prediabetes   [facility] [place], Marzella Schlein, MD   1 year ago Throat tightness   [facility] Trey Sailors, [place]      Future Appointments            In 1 month Bacigalupo, Marzella Schlein, MD [facility]

## 2021-02-04 ENCOUNTER — Other Ambulatory Visit: Payer: Self-pay

## 2021-02-04 ENCOUNTER — Ambulatory Visit
Admission: RE | Admit: 2021-02-04 | Discharge: 2021-02-04 | Disposition: A | Discharge status: Home / Self Care | Payer: Medicare HMO | Source: Ambulatory Visit | Attending: Family Medicine | Admitting: Family Medicine

## 2021-02-04 DIAGNOSIS — Z1231 Encounter for screening mammogram for malignant neoplasm of breast: Secondary | ICD-10-CM | POA: Diagnosis not present

## 2021-02-04 DIAGNOSIS — Z780 Asymptomatic menopausal state: Secondary | ICD-10-CM | POA: Diagnosis not present

## 2021-02-04 DIAGNOSIS — M810 Age-related osteoporosis without current pathological fracture: Secondary | ICD-10-CM | POA: Insufficient documentation

## 2021-02-10 ENCOUNTER — Ambulatory Visit (INDEPENDENT_AMBULATORY_CARE_PROVIDER_SITE_OTHER): Payer: Medicare HMO | Admitting: Family Medicine

## 2021-02-10 ENCOUNTER — Encounter: Payer: Self-pay | Admitting: Family Medicine

## 2021-02-10 ENCOUNTER — Other Ambulatory Visit: Payer: Self-pay

## 2021-02-10 VITALS — BP 136/69 | HR 64 | Temp 97.8°F | Ht 64.0 in | Wt 126.6 lb

## 2021-02-10 DIAGNOSIS — M810 Age-related osteoporosis without current pathological fracture: Secondary | ICD-10-CM | POA: Diagnosis not present

## 2021-02-10 DIAGNOSIS — R3912 Poor urinary stream: Secondary | ICD-10-CM

## 2021-02-10 DIAGNOSIS — K219 Gastro-esophageal reflux disease without esophagitis: Secondary | ICD-10-CM | POA: Diagnosis not present

## 2021-02-10 DIAGNOSIS — E7800 Pure hypercholesterolemia, unspecified: Secondary | ICD-10-CM

## 2021-02-10 DIAGNOSIS — R7303 Prediabetes: Secondary | ICD-10-CM

## 2021-02-10 DIAGNOSIS — E890 Postprocedural hypothyroidism: Secondary | ICD-10-CM

## 2021-02-10 DIAGNOSIS — R300 Dysuria: Secondary | ICD-10-CM | POA: Diagnosis not present

## 2021-02-10 LAB — POCT URINALYSIS DIPSTICK
Bilirubin, UA: NEGATIVE
Blood, UA: NEGATIVE
Glucose, UA: NEGATIVE
Ketones, UA: NEGATIVE
Leukocytes, UA: NEGATIVE
Nitrite, UA: NEGATIVE
Protein, UA: NEGATIVE
Spec Grav, UA: <=1.005 — AB (ref 1.010–1.025)
Urobilinogen, UA: 0.2 E.U./dL
pH, UA: 6 (ref 5.0–8.0)

## 2021-02-10 LAB — POCT GLYCOSYLATED HEMOGLOBIN (HGB A1C): Hemoglobin A1C: 5.5 % (ref 4.0–5.6)

## 2021-02-10 MED ORDER — OMEPRAZOLE 40 MG PO CPDR: 40.0000 mg | DELAYED_RELEASE_CAPSULE | Freq: Every day | ORAL | 3 refills | Status: DC

## 2021-02-10 NOTE — Assessment & Plan Note (Signed)
Reviewed recent DEXA scan Has done well on Fosamax, but is at the 5-year mark Plan to do drug holiday as we are seeing diminishing returns Repeat DEXA in 2 years Continue calcium and vitamin D supplementation and regular weightbearing exercise

## 2021-02-10 NOTE — Assessment & Plan Note (Signed)
Previously well controlled Continue Synthroid at current dose  Recheck TSH and adjust Synthroid as indicated   

## 2021-02-10 NOTE — Assessment & Plan Note (Signed)
Reviewed last lipid panel Not currently on a statin Recheck FLP and CMP Discussed diet and exercise  

## 2021-02-10 NOTE — Progress Notes (Signed)
Established patient visit   Patient: Sonya Barnes   DOB: 1945/08/28   75 y.o. Female  MRN: [id number] Visit Date: 02/10/2021  Today's healthcare provider: Lavon Paganini, MD   Chief Complaint  Patient presents with   Follow-up   Diabetes   Subjective    HPI  Pressure and burning on inside and outside of uterus. Patient states that it is getting worse. Low streams if any sometimes as well as incontinence. Symptoms began about 4-5 months ago. Has not consumed any medications for symptoms. No heat or cold compress Hypothyroid, follow-up  Lab Results  Component Value Date   TSH 0.526 08/15/2020   TSH 0.507 08/29/2019   TSH 0.567 02/23/2019   T4TOTAL 8.2 01/07/2016   Wt Readings from Last 3 Encounters:  02/10/21 126 lb 9.6 oz (57.4 kg)  08/15/20 126 lb 6.4 oz (57.3 kg)  02/06/20 127 lb (57.6 kg)    She was last seen for hypothyroid 6 months ago.  Management since that visit includes continue synthroid. She reports good compliance with treatment. She is not having side effects.   Symptoms: No change in energy level No constipation  No diarrhea Yes heat / cold intolerance  No nervousness No palpitations  No weight changes    -----------------------------------------------------------------------------------------     Medications: Outpatient Medications Prior to Visit  Medication Sig   ALPRAZolam (XANAX) 0.5 MG tablet Take 0.5-1 tablets (0.25-0.5 mg total) by mouth 2 (two) times daily as needed.   Calcium-Vitamin D-Vitamin K (VIACTIV PO) Take by mouth daily.   famotidine (PEPCID) 20 MG tablet Take 20 mg by mouth 2 (two) times daily.   fluticasone (FLONASE) 50 MCG/ACT nasal spray Place 2 sprays into the nose daily. 2 sprays each nostril   gabapentin (NEURONTIN) 300 MG capsule Take 1 capsule (300 mg total) by mouth at bedtime.   levothyroxine (SYNTHROID) 75 MCG tablet TAKE 1 TABLET BY MOUTH EVERY DAY   Multiple Vitamin (MULTIVITAMIN) tablet Take 1 tablet by  mouth daily.   Polyethyl Glycol-Propyl Glycol (SYSTANE ULTRA OP) Apply to eye.   [DISCONTINUED] alendronate (FOSAMAX) 70 MG tablet TAKE 1 TABLET BY MOUTH ONCE WEEKLY. TAKE WITH FULL GLASS OF WATER ON AN EMPTY STOMACH   [DISCONTINUED] omeprazole (PRILOSEC) 20 MG capsule Take 1 capsule (20 mg total) by mouth daily.   loratadine (CLARITIN) 10 MG tablet Take 10 mg by mouth daily.   No facility-administered medications prior to visit.    Review of Systems - per HPI  Last CBC Lab Results  Component Value Date   WBC 6.1 02/23/2019   HGB 13.5 02/23/2019   HCT 39.6 02/23/2019   MCV 89 02/23/2019   MCH 30.2 02/23/2019   RDW 13.0 02/23/2019   PLT 328 19/16/6060   Last metabolic panel Lab Results  Component Value Date   GLUCOSE 92 08/15/2020   NA 140 08/15/2020   K 4.3 08/15/2020   CL 99 08/15/2020   CO2 25 08/15/2020   BUN 14 08/15/2020   CREATININE 0.75 08/15/2020   EGFR 83 08/15/2020   GFRNONAA 65 02/23/2019   CALCIUM 9.7 08/15/2020   PROT 6.3 08/15/2020   ALBUMIN 4.2 08/15/2020   LABGLOB 2.1 08/15/2020   AGRATIO 2.0 08/15/2020   BILITOT 0.2 08/15/2020   ALKPHOS 74 08/15/2020   AST 18 08/15/2020   ALT 10 08/15/2020   ANIONGAP 7 01/11/2017   Last lipids Lab Results  Component Value Date   CHOL 224 (H) 08/15/2020   HDL 55 08/15/2020  LDLCALC 144 (H) 08/15/2020   TRIG 140 08/15/2020   CHOLHDL 3.4 02/23/2019   Last hemoglobin A1c Lab Results  Component Value Date   HGBA1C 5.5 02/10/2021   Last thyroid functions Lab Results  Component Value Date   TSH 0.526 08/15/2020   T4TOTAL 8.2 01/07/2016   Last vitamin D Lab Results  Component Value Date   VD25OH 42.2 01/07/2016       Objective    BP 136/69   Pulse 64   Temp 97.8 F (36.6 C) (Oral)   Ht 5' 4"  (1.626 m)   Wt 126 lb 9.6 oz (57.4 kg)   SpO2 97%   BMI 21.73 kg/m  BP Readings from Last 3 Encounters:  02/10/21 136/69  08/15/20 106/68  02/06/20 (!) 123/51   Wt Readings from Last 3 Encounters:   02/10/21 126 lb 9.6 oz (57.4 kg)  08/15/20 126 lb 6.4 oz (57.3 kg)  02/06/20 127 lb (57.6 kg)      Physical Exam Vitals reviewed.  Constitutional:      General: She is not in acute distress.    Appearance: Normal appearance. She is well-developed. She is not diaphoretic.  HENT:     Head: Normocephalic and atraumatic.  Eyes:     General: No scleral icterus.    Conjunctiva/sclera: Conjunctivae normal.  Neck:     Thyroid: No thyromegaly.  Cardiovascular:     Rate and Rhythm: Normal rate and regular rhythm.     Pulses: Normal pulses.     Heart sounds: Normal heart sounds. No murmur heard. Pulmonary:     Effort: Pulmonary effort is normal. No respiratory distress.     Breath sounds: Normal breath sounds. No wheezing, rhonchi or rales.  Musculoskeletal:     Cervical back: Neck supple.     Right lower leg: No edema.     Left lower leg: No edema.  Lymphadenopathy:     Cervical: No cervical adenopathy.  Skin:    General: Skin is warm and dry.     Findings: No rash.  Neurological:     Mental Status: She is alert and oriented to person, place, and time. Mental status is at baseline.  Psychiatric:        Mood and Affect: Mood normal.        Behavior: Behavior normal.      Results for orders placed or performed in visit on 02/10/21  POCT HgB A1C  Result Value Ref Range   Hemoglobin A1C 5.5 4.0 - 5.6 %  POCT Urinalysis Dipstick  Result Value Ref Range   Color, UA pale yellow    Clarity, UA clear    Glucose, UA Negative Negative   Bilirubin, UA negative    Ketones, UA negative    Spec Grav, UA <=1.005 (A) 1.010 - 1.025   Blood, UA neg    pH, UA 6.0 5.0 - 8.0   Protein, UA Negative Negative   Urobilinogen, UA 0.2 0.2 or 1.0 E.U./dL   Nitrite, UA negative    Leukocytes, UA Negative Negative    Assessment & Plan     Problem List Items Addressed This Visit       Digestive   Acid reflux    Chronic and well-controlled Try to decrease omeprazole to 20 mg daily in the  setting of osteoporosis, but she did not tolerate Continue omeprazole 40 mg daily      Relevant Medications   omeprazole (PRILOSEC) 40 MG capsule     Endocrine  Hypothyroidism associated with surgical procedure - Primary    Previously well controlled Continue Synthroid at current dose  Recheck TSH and adjust Synthroid as indicated        Relevant Orders   TSH     Musculoskeletal and Integument   Osteoporosis    Reviewed recent DEXA scan Has done well on Fosamax, but is at the 5-year mark Plan to do drug holiday as we are seeing diminishing returns Repeat DEXA in 2 years Continue calcium and vitamin D supplementation and regular weightbearing exercise      Relevant Orders   VITAMIN D 25 Hydroxy (Vit-D Deficiency, Fractures)     Other   Hypercholesteremia    Reviewed last lipid panel Not currently on a statin Recheck FLP and CMP Discussed diet and exercise       Relevant Orders   Comprehensive metabolic panel   Lipid panel   Prediabetes    Improved with A1c in the normal range today Continue low-carb diet and regular exercise      Relevant Orders   POCT HgB A1C (Completed)   Weak urinary stream    New acute problem No signs of UTI on UA today She did have an episode this morning where she felt like she could not pass urine at all, but it did eventually relax and she was able to [facility] if this could be related to previous diagnosis of bladder prolapse Referral to urology for further evaluation and management      Relevant Orders   Ambulatory referral to Urology   Other Visit Diagnoses     Dysuria       Relevant Orders   Ambulatory referral to Urology   POCT Urinalysis Dipstick (Completed)        Return in about 6 months (around 08/10/2021) for CPE, AWV.      I, Lavon Paganini, MD, have reviewed all documentation for this visit. The documentation on 02/10/21 for the exam, diagnosis, procedures, and orders are all accurate and  complete.   Idelia Caudell, Dionne Bucy, MD, MPH [facility]

## 2021-02-10 NOTE — Assessment & Plan Note (Signed)
Improved with A1c in the normal range today Continue low-carb diet and regular exercise

## 2021-02-10 NOTE — Assessment & Plan Note (Signed)
Chronic and well-controlled Try to decrease omeprazole to 20 mg daily in the setting of osteoporosis, but she did not tolerate Continue omeprazole 40 mg daily

## 2021-02-10 NOTE — Assessment & Plan Note (Signed)
New acute problem No signs of UTI on UA today She did have an episode this morning where she felt like she could not pass urine at all, but it did eventually relax and she was able to [facility] if this could be related to previous diagnosis of bladder prolapse Referral to urology for further evaluation and management

## 2021-02-11 DIAGNOSIS — E890 Postprocedural hypothyroidism: Secondary | ICD-10-CM | POA: Diagnosis not present

## 2021-02-11 DIAGNOSIS — M810 Age-related osteoporosis without current pathological fracture: Secondary | ICD-10-CM | POA: Diagnosis not present

## 2021-02-11 DIAGNOSIS — E7800 Pure hypercholesterolemia, unspecified: Secondary | ICD-10-CM | POA: Diagnosis not present

## 2021-02-12 LAB — COMPREHENSIVE METABOLIC PANEL
ALT: 9 IU/L (ref 0–32)
AST: 19 IU/L (ref 0–40)
Albumin/Globulin Ratio: 2.2 (ref 1.2–2.2)
Albumin: 4.2 g/dL (ref 3.7–4.7)
Alkaline Phosphatase: 81 IU/L (ref 44–121)
BUN/Creatinine Ratio: 21 (ref 12–28)
BUN: 18 mg/dL (ref 8–27)
Bilirubin Total: 0.3 mg/dL (ref 0.0–1.2)
CO2: 28 mmol/L (ref 20–29)
Calcium: 9.5 mg/dL (ref 8.7–10.3)
Chloride: 103 mmol/L (ref 96–106)
Creatinine, Ser: 0.87 mg/dL (ref 0.57–1.00)
Globulin, Total: 1.9 g/dL (ref 1.5–4.5)
Glucose: 76 mg/dL (ref 70–99)
Potassium: 4.4 mmol/L (ref 3.5–5.2)
Sodium: 144 mmol/L (ref 134–144)
Total Protein: 6.1 g/dL (ref 6.0–8.5)
eGFR: 69 mL/min/{1.73_m2} (ref >59)

## 2021-02-12 LAB — LIPID PANEL
Chol/HDL Ratio: 3.6 ratio (ref 0.0–4.4)
Cholesterol, Total: 222 mg/dL — ABNORMAL HIGH (ref 100–199)
HDL: 62 mg/dL (ref >39)
LDL Chol Calc (NIH): 141 mg/dL — ABNORMAL HIGH (ref 0–99)
Triglycerides: 106 mg/dL (ref 0–149)
VLDL Cholesterol Cal: 19 mg/dL (ref 5–40)

## 2021-02-12 LAB — TSH: TSH: 0.674 u[IU]/mL (ref 0.450–4.500)

## 2021-02-12 LAB — VITAMIN D 25 HYDROXY (VIT D DEFICIENCY, FRACTURES): Vit D, 25-Hydroxy: 71.1 ng/mL (ref 30.0–100.0)

## 2021-02-23 ENCOUNTER — Encounter: Payer: Self-pay | Admitting: Family Medicine

## 2021-02-24 ENCOUNTER — Encounter: Payer: Self-pay | Admitting: Family Medicine

## 2021-03-17 NOTE — Progress Notes (Signed)
03/18/21 9:05 AM   Frederico Hamman 16-Mar-1946 [id number]  Referring provider:  Erasmo Downer, MD [address] [place],  [place] [address] Chief Complaint  Patient presents with   Dysuria     HPI: Sonya Barnes is a 75 y.o.female who presents today for further evaluation of dysuria and weak urinary stream.   She was seen by her PCP, Dr. Beryle Flock on 02/10/2021 with weak urinary stream and dysuria. There was no sign of UTI on urinalysis. She also had an episode where she could not pass urine, it did eventually relax and she was able to void.   She has a personal history of bladder prolapse.  She reports that this was diagnosed by a PA and/or nurse practitioner at her primary's office several his back incidentally noted on pelvic exam.  She denies any vaginal bulging.  She reports today that she has weak stream. About 6 months ago her urinary symptoms worsened with pressure that is sometimes painful. She also reports that she feels like she does not empty her bladder fully. She has experienced burning in the urethra, this eventually went away.  Her symptoms are bothersome and worrisome to her.   She has been having pain during sexual intercourse, she does use gel to ease the pain. She has not used any vaginal estrogen cream. She is not particularly worried about painful sexual intercourse or sexual intercourse in general.   She has a history of kidney stones that turned into a kidney infection. She had a lithotripsy. This was about 16-18 years ago. She has not had any symptoms of a kidney stone recently. She reports that her father has a history of kidney stones.   Urinalysis today was unremarkable aside from calcium oxalate crystals.    PMH: Past Medical History:  Diagnosis Date   Allergy    Anxiety    Arthritis    lower spine   Bundle branch block, right    Degenerative disc disease, cervical    pt reports no limitations   GERD (gastroesophageal reflux  disease)    Hypothyroidism    Kidney stone    Osteoporosis    back, hips   Pre-diabetes    Tendonitis of elbow, right     Surgical History: Past Surgical History:  Procedure Laterality Date   BREAST BIOPSY Left 15 years ago   Core BX of calcs. Negtive   BREAST CYST ASPIRATION Right 15 years ago   Negative   BREAST SURGERY Left    biopsy   COLONOSCOPY N/A 02/01/2017   Procedure: COLONOSCOPY;  Surgeon: Midge Minium, MD;  [facility];  Service: Gastroenterology;  Laterality: N/A;   DILATION AND CURETTAGE OF UTERUS  1956-57   ESOPHAGEAL DILATION N/A 02/01/2017   Procedure: ESOPHAGEAL DILATION;  Surgeon: Midge Minium, MD;  Location: [facility];  Service: Gastroenterology;  Laterality: N/A;   ESOPHAGOGASTRODUODENOSCOPY N/A 02/01/2017   Procedure: ESOPHAGOGASTRODUODENOSCOPY (EGD);  Surgeon: Midge Minium, MD;  [facility];  Service: Gastroenterology;  Laterality: N/A;   HAMMER TOE SURGERY Bilateral    OVARIAN CYST REMOVAL  1967   THYROIDECTOMY, PARTIAL     TONSILLECTOMY      Home Medications:  Allergies as of 03/18/2021   No Known Allergies      Medication List        Accurate as of March 18, 2021  9:05 AM. If you have any questions, ask your nurse or doctor.  ALPRAZolam 0.5 MG tablet Commonly known as: XANAX Take 0.5-1 tablets (0.25-0.5 mg total) by mouth 2 (two) times daily as needed.   famotidine 20 MG tablet Commonly known as: PEPCID Take 20 mg by mouth 2 (two) times daily.   fluticasone 50 MCG/ACT nasal spray Commonly known as: FLONASE Place 2 sprays into the nose daily. 2 sprays each nostril   gabapentin 300 MG capsule Commonly known as: NEURONTIN Take 1 capsule (300 mg total) by mouth at bedtime.   levothyroxine 75 MCG tablet Commonly known as: SYNTHROID TAKE 1 TABLET BY MOUTH EVERY DAY   loratadine 10 MG tablet Commonly known as: CLARITIN Take 10 mg by mouth daily.   multivitamin tablet Take 1  tablet by mouth daily.   omeprazole 40 MG capsule Commonly known as: PRILOSEC Take 1 capsule (40 mg total) by mouth daily.   SYSTANE ULTRA OP Apply to eye.   VIACTIV PO Take by mouth daily.        Allergies: No Known Allergies  Family History: Family History  Problem Relation Age of Onset   Arthritis Mother    Cancer Mother        lung   Lung disease Mother    Osteoporosis Mother    Diabetes Father    Stroke Father    Heart disease Father    Heart disease Sister    Lung disease Sister    Osteoporosis Sister    Colon polyps Sister    Early death Brother    Mental illness Brother    Liver disease Brother    Breast cancer Neg Hx     Social History:  reports that she has never smoked. She has never used smokeless tobacco. She reports that she does not drink alcohol and does not use drugs.   Physical Exam: BP (!) 155/70   Pulse 79   Ht 5\' 4"  (1.626 m)   Wt 126 lb (57.2 kg)   BMI 21.63 kg/m   Constitutional:  Alert and oriented, No acute distress. HEENT: [place] AT, moist mucus membranes.  Trachea midline, no masses. Cardiovascular: No clubbing, cyanosis, or edema. Respiratory: Normal respiratory effort, no increased work of breathing. Pelvic: Small urethral caruncle , diffuse atrophic vaginitis appreciated.  Fairly decent vaginal vault support with mild stage I cystocele otherwise unremarkable.  Normal external genitalia.  Slight follow-up hypermobility with Valsalva. Skin: No rashes, bruises or suspicious lesions. Neurologic: Grossly intact, no focal deficits, moving all 4 extremities. Psychiatric: Normal mood and affect.  Pelvic exam chaperoned by , CMA  Laboratory Data:  Lab Results  Component Value Date   CREATININE 0.87 02/11/2021   Lab Results  Component Value Date   HGBA1C 5.5 02/10/2021    Urinalysis Calcium oxalate crystals but otherwise unremarkable.   Pertinent Imaging: Results for orders placed or performed in visit on  03/18/21  Bladder Scan (Post Void Residual) in office  Result Value Ref Range   Scan Result 0       Assessment & Plan:    Urethral Caruncle   - Exam today revealed urethral caruncle   - A urethral caruncle is a benign outgrowth at the urethral meatus (urethral opening). They occur most commonly in postmenopausal women. Urethral caruncles occur when the outermost part of the urethra everts or turns out. When the mucosa is circumferentially everted, meaning the growth encompasses the entire diameter of the urethra, rather than just a segment of the urethra, the lesion is a urethral prolapse.  Urethral caruncles are often  asymptomatic and found on pelvic exam.  Symptoms may include pain, light bleeding, painful urination, or impeded flow of urine. Women may also notice a bump at the urethral meatus.  In asymptomatic patients, treatment is often not necessary. For symptomatic patients, treatment includes vaginal estrogen cream, anti-inflammatory medications such as Motrin, and warm sitz baths.  Surgical excision is recommended for larger, symptomatic caruncles that do not respond to more conservative treatment.  -Offered topical estrogen cream, declined at this time but may consider in future if her urethral discomfort recurs   2. Vaginal dryness - Recommend vaginal estrogen, declined but may consider in the future   3. History of kidney stones  -Incidental calcium oxalate crystals today in her urine, offered upper tract imaging but declined in the absence of flank pain  4. Weak urinary stream/pelvic pressure PVR today 0, patient was reassured that she is in fact emptying her bladder adequately and structurally, and appears that her bladder has fairly decent support without significant pelvic organ prolapse on exam.  We will not recommend any sort of surgery and/or pessary at this point in time in the absence of vaginal symptoms and adequate emptying.  We discussed utilization of  Crede technique to ensure that she is emptying.  She is offered to return at any point for recheck of PVR.  At this point time, she is satisfied with conservative management and will return if her symptoms worsen.  Follow-up as needed   I,Kailey Littlejohn,acting as a scribe for Vanna Scotland, MD.,have documented all relevant documentation on the behalf of Vanna Scotland, MD,as directed by  Vanna Scotland, MD while in the presence of Vanna Scotland, MD.  I have reviewed the above documentation for accuracy and completeness, and I agree with the above.   Vanna Scotland, MD  [facility] [address], [address] [place], [place] [address] [phone]

## 2021-03-18 ENCOUNTER — Ambulatory Visit: Payer: Medicare HMO | Admitting: Urology

## 2021-03-18 ENCOUNTER — Other Ambulatory Visit: Payer: Self-pay

## 2021-03-18 ENCOUNTER — Encounter: Payer: Self-pay | Admitting: Urology

## 2021-03-18 VITALS — BP 155/70 | HR 79 | Ht 64.0 in | Wt 126.0 lb

## 2021-03-18 DIAGNOSIS — R3912 Poor urinary stream: Secondary | ICD-10-CM | POA: Diagnosis not present

## 2021-03-18 DIAGNOSIS — Z87442 Personal history of urinary calculi: Secondary | ICD-10-CM | POA: Diagnosis not present

## 2021-03-18 DIAGNOSIS — R300 Dysuria: Secondary | ICD-10-CM | POA: Diagnosis not present

## 2021-03-18 LAB — MICROSCOPIC EXAMINATION: RBC, Urine: NONE SEEN /hpf (ref 0–2)

## 2021-03-18 LAB — URINALYSIS, COMPLETE
Bilirubin, UA: NEGATIVE
Glucose, UA: NEGATIVE
Leukocytes,UA: NEGATIVE
Nitrite, UA: NEGATIVE
Protein,UA: NEGATIVE
Specific Gravity, UA: 1.025 (ref 1.005–1.030)
Urobilinogen, Ur: 0.2 mg/dL (ref 0.2–1.0)
pH, UA: 5.5 (ref 5.0–7.5)

## 2021-03-18 LAB — BLADDER SCAN AMB NON-IMAGING: Scan Result: 0

## 2021-03-18 NOTE — Patient Instructions (Signed)
A urethral caruncle is a benign outgrowth at the urethral meatus (urethral opening). They occur most commonly in postmenopausal women. Urethral caruncles occur when the outermost part of the urethra everts or turns out. When the mucosa is circumferentially everted, meaning the growth encompasses the entire diameter of the urethra, rather than just a segment of the urethra, the lesion is a urethral prolapse.  Urethral caruncles are often asymptomatic and found on pelvic exam.  Symptoms may include pain, light bleeding, painful urination, or impeded flow of urine. Women may also notice a bump at the urethral meatus.  In asymptomatic patients, treatment is often not necessary. For symptomatic patients, treatment includes vaginal estrogen cream, anti-inflammatory medications such as Motrin, and warm sitz baths.  Surgical excision is recommended for larger, symptomatic caruncles that do not respond to more conservative treatment.  

## 2021-03-25 ENCOUNTER — Other Ambulatory Visit: Payer: Self-pay | Admitting: Family Medicine

## 2021-03-26 NOTE — Telephone Encounter (Signed)
Requested Prescriptions  Pending Prescriptions Disp Refills  . levothyroxine (SYNTHROID) 75 MCG tablet [Pharmacy Med Name: LEVOTHYROXINE 75 MCG TABLET] 90 tablet 1    Sig: TAKE 1 TABLET BY MOUTH EVERY DAY     Endocrinology:  Hypothyroid Agents Failed - 03/25/2021  2:47 PM      Failed - TSH needs to be rechecked within 3 months after an abnormal result. Refill until TSH is due.      Passed - TSH in normal range and within 360 days    TSH  Date Value Ref Range Status  02/11/2021 0.674 0.450 - 4.500 uIU/mL Final         Passed - Valid encounter within last 12 months    Recent Outpatient Visits          1 month ago Hypothyroidism associated with surgical procedure   [facility] [place], Marzella Schlein, MD   7 months ago Medicare annual wellness visit, subsequent   [facility], Marzella Schlein, MD   1 year ago Internal hemorrhoids   [facility] [place], Marzella Schlein, MD   1 year ago COVID-19   [facility], Marzella Schlein, MD   1 year ago Prediabetes   [facility] [place], Marzella Schlein, MD      Future Appointments            In 4 months Bacigalupo, Marzella Schlein, MD [facility]

## 2021-05-20 ENCOUNTER — Telehealth: Payer: Self-pay | Admitting: Urology

## 2021-05-20 NOTE — Telephone Encounter (Signed)
If she is still having urinary issues especially burning or discomfort with urination, I think that a cystoscopy is very reasonable.  Okay to schedule.  Vanna Scotland, MD

## 2021-05-20 NOTE — Telephone Encounter (Signed)
Spoke with patient, called and scheduled cysto. Voiced understanding.

## 2021-05-20 NOTE — Telephone Encounter (Signed)
Pt saw Dr. Erlene Quan on 11/1 and said she was told to call in and schedule a cysto when she was ready. Pt would like a call back today at [phone], as she feels she should not have another office visit when she was told by Dr. Erlene Quan to call in and schedule the cysto when she was ready.

## 2021-05-21 NOTE — Progress Notes (Signed)
° °  05/22/21  CC:  Chief Complaint  Patient presents with   Cysto     HPI: Sonya Barnes is a 76 y.o.female with a personal history of bladder prolapse, kidney stones, urethral caruncle, vaginal dryness, and weak urinary stream/pelvic pressure, who presents today for a cystoscopy.   She is s/p lithotripsy about 16-18 years ago.   On pelvic exam in office on 03/18/2021 she was found to have a urethral caruncle.   I spoke to her via telephone on 05/20/2020 she reported that she was still having urinary issues especially burning and discomfort with urination.   She reports that she continues to strain to be able to empty her bladder which over the past several weeks seems to be worsening.  She is concerned about this.  Previous PVR was 0.  Vitals:   05/22/21 0834  BP: 139/72  Pulse: 74  NED. A&Ox3.   No respiratory distress   Abd soft, NT, ND Normal external genitalia with patent urethral meatus  Cystoscopy Procedure Note  Patient identification was confirmed, informed consent was obtained, and patient was prepped using Betadine solution.  Lidocaine jelly was administered per urethral meatus.    Procedure: -On initial attempt to advance the flexible cystoscope, met resistance in the distal one third of the urethra -In the setting of female urethral stricture, they obtained verbal consent for urethral dilation.  I then used female sounds starting with 89 [place] at which time I had to use a small amount of pressure to advance the sound which then advanced easily and I was easily able to dilate up to 18 [place] thereafter -The scope was then passed through a fairly normal-appearing urethra with some mild trauma from dilation into the bladder   normal urothelium which was smooth-walled with mild to moderate trabeculation    no stones    no ulcers     no tumors    no urethral polyps   - Ureteral orifices were normal in position and appearance.  Post-Procedure: - Patient tolerated  the procedure well  Assessment/ Plan:  Urethral stricture/ Urinary straining - true female on cystoscopy today that was dilated  - I do think the addition of topical estrogen cream would be beneficial  - she may require repeat dilation or self catherization in the future depending on her symptom  - Will continue to monitor in 6 weeks with PVR.  - Estradiol; prescribed, advised to use every day for 2 weeks and then Monday Wednesday Friday thereafter  2. Vaginal dryness - Recommend vaginal estrogen   Return in 6 weeks for PVR   I,Kailey Littlejohn,acting as a scribe for Hollice Espy, MD.,have documented all relevant documentation on the behalf of Hollice Espy, MD,as directed by  Hollice Espy, MD while in the presence of Hollice Espy, MD.   I have reviewed the above documentation for accuracy and completeness, and I agree with the above.   Hollice Espy, MD

## 2021-05-22 ENCOUNTER — Other Ambulatory Visit: Payer: Self-pay

## 2021-05-22 ENCOUNTER — Encounter: Payer: Self-pay | Admitting: Urology

## 2021-05-22 ENCOUNTER — Ambulatory Visit: Payer: Medicare HMO | Admitting: Urology

## 2021-05-22 VITALS — BP 139/72 | HR 74 | Ht 64.0 in | Wt 126.0 lb

## 2021-05-22 DIAGNOSIS — R300 Dysuria: Secondary | ICD-10-CM | POA: Diagnosis not present

## 2021-05-22 MED ORDER — ESTRADIOL 0.1 MG/GM VA CREA: TOPICAL_CREAM | VAGINAL | 12 refills | Status: DC

## 2021-05-23 LAB — MICROSCOPIC EXAMINATION: Bacteria, UA: NONE SEEN

## 2021-05-23 LAB — URINALYSIS, COMPLETE
Bilirubin, UA: NEGATIVE
Glucose, UA: NEGATIVE
Leukocytes,UA: NEGATIVE
Nitrite, UA: NEGATIVE
RBC, UA: NEGATIVE
Specific Gravity, UA: >1.03 — ABNORMAL HIGH (ref 1.005–1.030)
Urobilinogen, Ur: 0.2 mg/dL (ref 0.2–1.0)
pH, UA: 5 (ref 5.0–7.5)

## 2021-05-27 ENCOUNTER — Other Ambulatory Visit: Payer: Self-pay | Admitting: *Deleted

## 2021-05-27 ENCOUNTER — Telehealth: Payer: Self-pay | Admitting: *Deleted

## 2021-05-27 NOTE — Telephone Encounter (Signed)
Talked with patient about coming in today and she will call back to let us know if she can get a ride

## 2021-05-27 NOTE — Telephone Encounter (Signed)
Patient called in this morning and states she had a cystoscopy on 05/22/2021. Patient states she is still having burning. No smell or odor. Just burning

## 2021-05-28 ENCOUNTER — Other Ambulatory Visit: Payer: Self-pay

## 2021-05-28 DIAGNOSIS — R300 Dysuria: Secondary | ICD-10-CM

## 2021-05-30 ENCOUNTER — Ambulatory Visit: Payer: Medicare HMO | Admitting: Urology

## 2021-05-30 ENCOUNTER — Other Ambulatory Visit: Payer: Self-pay

## 2021-05-30 ENCOUNTER — Other Ambulatory Visit
Admission: RE | Admit: 2021-05-30 | Discharge: 2021-05-30 | Disposition: A | Discharge status: Home / Self Care | Payer: Medicare HMO | Attending: Urology | Admitting: Urology

## 2021-05-30 VITALS — BP 131/77 | HR 70 | Ht 64.0 in | Wt 126.0 lb

## 2021-05-30 DIAGNOSIS — N3582 Other urethral stricture, female: Secondary | ICD-10-CM

## 2021-05-30 DIAGNOSIS — R300 Dysuria: Secondary | ICD-10-CM | POA: Diagnosis not present

## 2021-05-30 LAB — URINALYSIS, COMPLETE (UACMP) WITH MICROSCOPIC
Bilirubin Urine: NEGATIVE
Glucose, UA: NEGATIVE mg/dL
Hgb urine dipstick: NEGATIVE
Ketones, ur: NEGATIVE mg/dL
Leukocytes,Ua: NEGATIVE
Nitrite: NEGATIVE
Protein, ur: NEGATIVE mg/dL
Specific Gravity, Urine: <1.005 — ABNORMAL LOW (ref 1.005–1.030)
pH: 5.5 (ref 5.0–8.0)

## 2021-05-30 NOTE — Progress Notes (Signed)
05/30/2021 10:19 AM   Sonya Barnes 1946-02-27 [id number]  Referring provider: Erasmo DownerBacigalupo, Angela M, MD [address] [place] [id number]  Chief Complaint  Patient presents with   Dysuria    HPI: 76 year old female who presents today for further evaluation of dysuria.  She underwent cystoscopy last week at which time she was found to have a true female urethral stricture and underwent dilation.  She was told to call if her dysuria fail to resolve.  She is very upset today that she is having ongoing burning.  Its not when she pees and she is not having frequency urgency.  In fact, she mentions today that since having her urethra dilated, she is now able to have a good stream and empty her bladder which the first time in many years.  The burning is more external.  She does think that it was perhaps going on even before her urethral dilation but now significantly worse.  Urinalysis today is unremarkable  She has been using Pyridium as needed.  She is only drinking water.  She started using the topical estrogen cream as recommended last week.   PMH: Past Medical History:  Diagnosis Date   Allergy    Anxiety    Arthritis    lower spine   Bundle branch block, right    Degenerative disc disease, cervical    pt reports no limitations   GERD (gastroesophageal reflux disease)    Hypothyroidism    Kidney stone    Osteoporosis    back, hips   Pre-diabetes    Tendonitis of elbow, right     Surgical History: Past Surgical History:  Procedure Laterality Date   BREAST BIOPSY Left 15 years ago   Core BX of calcs. Negtive   BREAST CYST ASPIRATION Right 15 years ago   Negative   BREAST SURGERY Left    biopsy   COLONOSCOPY N/A 02/01/2017   Procedure: COLONOSCOPY;  Surgeon: Midge MiniumWohl, Darren, MD;  [facility];  Service: Gastroenterology;  Laterality: N/A;   DILATION AND CURETTAGE OF UTERUS  1956-57   ESOPHAGEAL DILATION N/A 02/01/2017   Procedure:  ESOPHAGEAL DILATION;  Surgeon: Midge MiniumWohl, Darren, MD;  [facility];  Service: Gastroenterology;  Laterality: N/A;   ESOPHAGOGASTRODUODENOSCOPY N/A 02/01/2017   Procedure: ESOPHAGOGASTRODUODENOSCOPY (EGD);  Surgeon: Midge MiniumWohl, Darren, MD;  [facility];  Service: Gastroenterology;  Laterality: N/A;   HAMMER TOE SURGERY Bilateral    OVARIAN CYST REMOVAL  1967   THYROIDECTOMY, PARTIAL     TONSILLECTOMY      Home Medications:  Allergies as of 05/30/2021   No Known Allergies      Medication List        Accurate as of May 30, 2021 10:19 AM. If you have any questions, ask your nurse or doctor.          ALPRAZolam 0.5 MG tablet Commonly known as: XANAX Take 0.5-1 tablets (0.25-0.5 mg total) by mouth 2 (two) times daily as needed.   estradiol 0.1 MG/GM vaginal cream Commonly known as: ESTRACE Estrogen Cream Instruction  Discard applicator  Apply pea sized amount to tip of finger to urethra before bed. Wash hands well after application. Use Monday, Wednesday and Friday   famotidine 20 MG tablet Commonly known as: PEPCID Take 20 mg by mouth 2 (two) times daily.   fluticasone 50 MCG/ACT nasal spray Commonly known as: FLONASE Place 2 sprays into the nose daily. 2 sprays each nostril  gabapentin 300 MG capsule Commonly known as: NEURONTIN Take 1 capsule (300 mg total) by mouth at bedtime.   levothyroxine 75 MCG tablet Commonly known as: SYNTHROID TAKE 1 TABLET BY MOUTH EVERY DAY   loratadine 10 MG tablet Commonly known as: CLARITIN Take 10 mg by mouth daily.   multivitamin tablet Take 1 tablet by mouth daily.   omeprazole 40 MG capsule Commonly known as: PRILOSEC Take 1 capsule (40 mg total) by mouth daily.   SYSTANE ULTRA OP Apply to eye.   VIACTIV PO Take by mouth daily.        Allergies: No Known Allergies  Family History: Family History  Problem Relation Age of Onset   Arthritis Mother    Cancer Mother        lung    Lung disease Mother    Osteoporosis Mother    Diabetes Father    Stroke Father    Heart disease Father    Heart disease Sister    Lung disease Sister    Osteoporosis Sister    Colon polyps Sister    Early death Brother    Mental illness Brother    Liver disease Brother    Breast cancer Neg Hx     Social History:  reports that she has never smoked. She has never used smokeless tobacco. She reports that she does not drink alcohol and does not use drugs.   Physical Exam: BP 131/77    Pulse 70    Ht 5\' 4"  (1.626 m)    Wt 126 lb (57.2 kg)    BMI 21.63 kg/m   Constitutional:  Alert and oriented, No acute distress. HEENT: [place] AT, moist mucus membranes.  Trachea midline, no masses. Cardiovascular: No clubbing, cyanosis, or edema. Respiratory: Normal respiratory effort, no increased work of breathing. Neurologic: Grossly intact, no focal deficits, moving all 4 extremities. Psychiatric: Normal mood and affect.  Laboratory Data: Lab Results  Component Value Date   WBC 6.1 02/23/2019   HGB 13.5 02/23/2019   HCT 39.6 02/23/2019   MCV 89 02/23/2019   PLT 328 02/23/2019    Lab Results  Component Value Date   CREATININE 0.87 02/11/2021     Lab Results  Component Value Date   HGBA1C 5.5 02/10/2021    Urinalysis Results for orders placed or performed during the hospital encounter of 05/30/21  Urinalysis, Complete w Microscopic  Result Value Ref Range   Color, Urine YELLOW YELLOW   APPearance CLEAR CLEAR   Specific Gravity, Urine <1.005 (L) 1.005 - 1.030   pH 5.5 5.0 - 8.0   Glucose, UA NEGATIVE NEGATIVE mg/dL   Hgb urine dipstick NEGATIVE NEGATIVE   Bilirubin Urine NEGATIVE NEGATIVE   Ketones, ur NEGATIVE NEGATIVE mg/dL   Protein, ur NEGATIVE NEGATIVE mg/dL   Nitrite NEGATIVE NEGATIVE   Leukocytes,Ua NEGATIVE NEGATIVE   Squamous Epithelial / LPF 0-5 0 - 5   WBC, UA 0-5 0 - 5 WBC/hpf   RBC / HPF 0-5 0 - 5 RBC/hpf   Bacteria, UA FEW (A) NONE SEEN    Assessment & Plan:     1. Other stricture of urethra in female Ongoing urethral discomfort following urethral dilation  Successfully ruled out infection today  Suspect continued discomfort from mechanical trauma from the dilation itself.  Continue topical estrogen cream and other supportive care including Azo, warm compresses as needed, drinking water, etc.  She was provided with some lidocaine jelly today to be used up to 4 times a day,  pea-sized amount on the area of burning externally as needed for discomfort.  Anticipate her discomfort should improve with time.  2. Dysuria As above   F/u as scheduled  Vanna Scotland, MD  [facility] [address] [place], [place] [address] [phone]

## 2021-06-03 ENCOUNTER — Encounter: Payer: Self-pay | Admitting: Family Medicine

## 2021-06-03 DIAGNOSIS — F419 Anxiety disorder, unspecified: Secondary | ICD-10-CM

## 2021-06-03 MED ORDER — ALPRAZOLAM 0.5 MG PO TABS: 0.2500 mg | ORAL_TABLET | Freq: Two times a day (BID) | ORAL | 0 refills | Status: DC | PRN

## 2021-07-03 ENCOUNTER — Ambulatory Visit: Payer: Medicare HMO | Admitting: Urology

## 2021-08-08 NOTE — Progress Notes (Signed)
?  ? ?I,Sulibeya S Dimas,acting as a scribe for Shirlee Latch, MD.,have documented all relevant documentation on the behalf of Shirlee Latch, MD,as directed by  Shirlee Latch, MD while in the presence of Shirlee Latch, MD. ? ? ?Established patient visit ? ? ?Patient: Sonya Barnes   DOB: 16-Dec-1945   77 y.o. Female  MRN: [id number] ?Visit Date: 08/11/2021 ? ?Today's healthcare provider: Shirlee Latch, MD  ? ?Chief Complaint  ?Patient presents with  ? Anxiety  ? Depression  ? Hypothyroidism  ? ?Subjective  ?  ?HPI  ?Anxiety, Follow-up ? ?She was last seen for anxiety 1 years ago. ?Changes made at last visit include no changes, continue Xanax sparingly. ?  ?She reports excellent compliance with treatment. Patient reports she takes one tablet once a month.  ?She reports excellent tolerance of treatment. ?She is not having side effects.  ? ?She feels her anxiety is mild and Improved since last visit. ? ?Symptoms: ?No chest pain No difficulty concentrating  ?No dizziness No fatigue  ?No feelings of losing control No insomnia  ?No irritable No palpitations  ?No panic attacks No racing thoughts  ?No shortness of breath No sweating  ?No tremors/shakes   ? ?GAD-7 Results ? ?  08/11/2021  ? 11:18 AM  ?GAD-7 Generalized Anxiety Disorder Screening Tool  ?1. Feeling Nervous, Anxious, or on Edge 1  ?2. Not Being Able to Stop or Control Worrying 0  ?3. Worrying Too Much About Different Things 1  ?4. Trouble Relaxing 1  ?5. Being So Restless it's Hard To Sit Still 0  ?6. Becoming Easily Annoyed or Irritable 1  ?7. Feeling Afraid As If Something Awful Might Happen 1  ?Total GAD-7 Score 5  ?Difficulty At Work, Home, or Getting  Along With Others? Not difficult at all  ? ? ?PHQ-9 Scores ? ?  08/11/2021  ? 11:22 AM 02/10/2021  ? 11:32 AM 08/15/2020  ?  2:49 PM  ?PHQ9 SCORE ONLY  ?PHQ-9 Total Score 4 5 0  ? ? ?--------------------------------------------------------------------------------------------------- ?Depression,  Follow-up ? ?She  was last seen for this 6 months ago. ?Changes made at last visit include no changes. ?  ?She reports excellent compliance with treatment. ?She is not having side effects.  ? ?She reports excellent tolerance of treatment. ?Current symptoms include: fatigue, hopelessness, and insomnia ?She feels she is Improved since last visit. ? ? ?  08/11/2021  ? 11:22 AM 02/10/2021  ? 11:32 AM 08/15/2020  ?  2:49 PM  ?Depression screen PHQ 2/9  ?Decreased Interest 1 1 0  ?Down, Depressed, Hopeless 1 1 0  ?PHQ - 2 Score 2 2 0  ?Altered sleeping 1 1 0  ?Tired, decreased energy 1 1 0  ?Change in appetite 0 1 0  ?Feeling bad or failure about yourself  0 0 0  ?Trouble concentrating 0 0 0  ?Moving slowly or fidgety/restless 0 0 0  ?Suicidal thoughts 0 0 0  ?PHQ-9 Score 4 5 0  ?Difficult doing work/chores Not difficult at all Not difficult at all Not difficult at all  ?  ?----------------------------------------------------------------------------------------- ?Hypothyroid, follow-up ? ?Lab Results  ?Component Value Date  ? TSH 0.674 02/11/2021  ? TSH 0.526 08/15/2020  ? TSH 0.507 08/29/2019  ? T4TOTAL 8.2 01/07/2016  ? ? ?Wt Readings from Last 3 Encounters:  ?08/11/21 129 lb 3.2 oz (58.6 kg)  ?05/30/21 126 lb (57.2 kg)  ?05/22/21 126 lb (57.2 kg)  ? ? ?She was last seen for hypothyroid 6 months ago.  ?  Management since that visit includes no changes. ?She reports excellent compliance with treatment. ?She is not having side effects.  ? ?Symptoms: ?No change in energy level Yes constipation  ?No diarrhea Yes heat / cold intolerance  ?Yes nervousness Yes palpitations  ?No weight changes   ? ?----------------------------------------------------------------------------------------- ? ? ?Medications: ?Outpatient Medications Prior to Visit  ?Medication Sig  ? Calcium-Vitamin D-Vitamin K (VIACTIV PO) Take by mouth daily.  ? estradiol (ESTRACE) 0.1 MG/GM vaginal cream Estrogen Cream Instruction ? ?Discard applicator ? ?Apply pea  sized amount to tip of finger to urethra before bed. Wash hands well after application. ?Use Monday, Wednesday and Friday  ? famotidine (PEPCID) 20 MG tablet Take 20 mg by mouth 2 (two) times daily.  ? fluticasone (FLONASE) 50 MCG/ACT nasal spray Place 2 sprays into the nose daily. 2 sprays each nostril  ? gabapentin (NEURONTIN) 300 MG capsule Take 1 capsule (300 mg total) by mouth at bedtime.  ? levothyroxine (SYNTHROID) 75 MCG tablet TAKE 1 TABLET BY MOUTH EVERY DAY  ? loratadine (CLARITIN) 10 MG tablet Take 10 mg by mouth daily.  ? Multiple Vitamin (MULTIVITAMIN) tablet Take 1 tablet by mouth daily.  ? omeprazole (PRILOSEC) 40 MG capsule Take 1 capsule (40 mg total) by mouth daily.  ? Polyethyl Glycol-Propyl Glycol (SYSTANE ULTRA OP) Apply to eye.  ? [DISCONTINUED] ALPRAZolam (XANAX) 0.5 MG tablet Take 0.5-1 tablets (0.25-0.5 mg total) by mouth 2 (two) times daily as needed.  ? ?No facility-administered medications prior to visit.  ? ? ?Review of Systems  ?Constitutional:  Negative for appetite change and fatigue.  ?Respiratory:  Negative for chest tightness and shortness of breath.   ?Cardiovascular:  Negative for chest pain and palpitations.  ?Psychiatric/Behavioral:  The patient is nervous/anxious.   ? ? ?  Objective  ?  ?BP 106/66 (BP Location: Left Arm, Patient Position: Sitting, Cuff Size: Normal)   Pulse (!) 54   Temp 97.6 ?F (36.4 ?C) (Temporal)   Resp 16   Wt 129 lb 3.2 oz (58.6 kg)   BMI 22.18 kg/m?  ?BP Readings from Last 3 Encounters:  ?08/11/21 106/66  ?05/30/21 131/77  ?05/22/21 139/72  ? ?Wt Readings from Last 3 Encounters:  ?08/11/21 129 lb 3.2 oz (58.6 kg)  ?05/30/21 126 lb (57.2 kg)  ?05/22/21 126 lb (57.2 kg)  ? ?  ? ?Physical Exam ?Vitals reviewed.  ?Constitutional:   ?   General: She is not in acute distress. ?   Appearance: Normal appearance. She is well-developed. She is not diaphoretic.  ?HENT:  ?   Head: Normocephalic and atraumatic.  ?Eyes:  ?   General: No scleral icterus. ?    Conjunctiva/sclera: Conjunctivae normal.  ?Neck:  ?   Thyroid: No thyromegaly.  ?Cardiovascular:  ?   Rate and Rhythm: Normal rate and regular rhythm.  ?   Pulses: Normal pulses.  ?   Heart sounds: Normal heart sounds. No murmur heard. ?Pulmonary:  ?   Effort: Pulmonary effort is normal. No respiratory distress.  ?   Breath sounds: Normal breath sounds. No wheezing, rhonchi or rales.  ?Musculoskeletal:  ?   Cervical back: Neck supple.  ?   Right lower leg: No edema.  ?   Left lower leg: No edema.  ?Lymphadenopathy:  ?   Cervical: No cervical adenopathy.  ?Skin: ?   General: Skin is warm and dry.  ?   Findings: No rash.  ?Neurological:  ?   Mental Status: She is alert and oriented to  person, place, and time. Mental status is at baseline.  ?Psychiatric:     ?   Mood and Affect: Mood normal.     ?   Behavior: Behavior normal.  ?  ? ? ?No results found for any visits on 08/11/21. ? Assessment & Plan  ?  ? ?Problem List Items Addressed This Visit   ? ?  ? Endocrine  ? Hypothyroidism associated with surgical procedure - Primary  ?  Previously well controlled ?Continue Synthroid at current dose  ?Recheck TSH and adjust Synthroid as indicated   ?  ?  ? Relevant Orders  ? TSH  ?  ? Other  ? Anxiety  ?  Chronic and well controlled ?Using xanax very sparingly ?  ?  ? Relevant Medications  ? ALPRAZolam (XANAX) 0.5 MG tablet  ? Clinical depression  ?  Not currently on treatment ?In remission ?Continue to monitor ?  ?  ? Relevant Medications  ? ALPRAZolam (XANAX) 0.5 MG tablet  ? Prediabetes  ?  Recommend low carb diet ?Recheck A1c ?  ?  ? Relevant Orders  ? Hemoglobin A1c  ? Dysuria  ?  Ongoing x2.5 months since cystoscopy for urethral stricture ?Will check UA to ensure no infection ?Will likely need to f/u with Urology for next steps. ?  ?  ? Relevant Orders  ? POCT Urinalysis Dipstick  ?  ? ?Return in about 6 months (around 02/11/2022) for AWV, CPE.  ?   ? ?I, Shirlee LatchAngela Moiz Ryant, MD, have reviewed all documentation for this  visit. The documentation on 08/11/21 for the exam, diagnosis, procedures, and orders are all accurate and complete. ? ? ?Erasmo DownerBacigalupo, Mazi Schuff M, MD, MPH ?[facility] ?[facility]   ?

## 2021-08-11 ENCOUNTER — Other Ambulatory Visit: Payer: Self-pay

## 2021-08-11 ENCOUNTER — Ambulatory Visit (INDEPENDENT_AMBULATORY_CARE_PROVIDER_SITE_OTHER): Payer: Medicare HMO | Admitting: Family Medicine

## 2021-08-11 ENCOUNTER — Encounter: Payer: Self-pay | Admitting: Family Medicine

## 2021-08-11 VITALS — BP 106/66 | HR 54 | Temp 97.6°F | Resp 16 | Wt 129.2 lb

## 2021-08-11 DIAGNOSIS — E890 Postprocedural hypothyroidism: Secondary | ICD-10-CM | POA: Diagnosis not present

## 2021-08-11 DIAGNOSIS — R69 Illness, unspecified: Secondary | ICD-10-CM | POA: Diagnosis not present

## 2021-08-11 DIAGNOSIS — F419 Anxiety disorder, unspecified: Secondary | ICD-10-CM | POA: Diagnosis not present

## 2021-08-11 DIAGNOSIS — F329 Major depressive disorder, single episode, unspecified: Secondary | ICD-10-CM | POA: Diagnosis not present

## 2021-08-11 DIAGNOSIS — R7303 Prediabetes: Secondary | ICD-10-CM | POA: Diagnosis not present

## 2021-08-11 DIAGNOSIS — R300 Dysuria: Secondary | ICD-10-CM | POA: Diagnosis not present

## 2021-08-11 LAB — POCT URINALYSIS DIPSTICK
Bilirubin, UA: NEGATIVE
Blood, UA: NEGATIVE
Glucose, UA: NEGATIVE
Ketones, UA: NEGATIVE
Leukocytes, UA: NEGATIVE
Nitrite, UA: NEGATIVE
Protein, UA: NEGATIVE
Spec Grav, UA: <=1.005 — AB (ref 1.010–1.025)
Urobilinogen, UA: 0.2 E.U./dL
pH, UA: 6 (ref 5.0–8.0)

## 2021-08-11 MED ORDER — ALPRAZOLAM 0.5 MG PO TABS: 0.2500 mg | ORAL_TABLET | Freq: Two times a day (BID) | ORAL | 0 refills | Status: DC | PRN

## 2021-08-11 NOTE — Assessment & Plan Note (Signed)
Ongoing x2.5 months since cystoscopy for urethral stricture ?Will check UA to ensure no infection ?Will likely need to f/u with Urology for next steps. ?

## 2021-08-11 NOTE — Assessment & Plan Note (Signed)
Previously well controlled Continue Synthroid at current dose  Recheck TSH and adjust Synthroid as indicated   

## 2021-08-11 NOTE — Assessment & Plan Note (Signed)
Recommend low carb diet °Recheck A1c  °

## 2021-08-11 NOTE — Assessment & Plan Note (Signed)
Not currently on treatment ?In remission ?Continue to monitor ?

## 2021-08-11 NOTE — Assessment & Plan Note (Signed)
Chronic and well controlled ?Using xanax very sparingly ?

## 2021-08-12 DIAGNOSIS — R7303 Prediabetes: Secondary | ICD-10-CM | POA: Diagnosis not present

## 2021-08-12 DIAGNOSIS — E890 Postprocedural hypothyroidism: Secondary | ICD-10-CM | POA: Diagnosis not present

## 2021-08-13 LAB — TSH: TSH: 0.404 u[IU]/mL — ABNORMAL LOW (ref 0.450–4.500)

## 2021-08-13 LAB — HEMOGLOBIN A1C
Est. average glucose Bld gHb Est-mCnc: 117 mg/dL
Hgb A1c MFr Bld: 5.7 % — ABNORMAL HIGH (ref 4.8–5.6)

## 2021-08-14 ENCOUNTER — Telehealth: Payer: Self-pay | Admitting: Family Medicine

## 2021-08-14 NOTE — Telephone Encounter (Signed)
Copied from CRM [id number]. Topic: General - Other ?>> Aug 14, 2021  1:52 PM Jaquita Rector A wrote: ?Reason for CRM: Patient called in asking to speak to Dr B or her nurse in reference to a test result please advise and call patient at Ph#  [phone] ?

## 2021-08-14 NOTE — Telephone Encounter (Signed)
Patient concerned about abnormal lab on her tsh. I advised that Dr. B is out of the office and we will get back with her tomorrow to update on recommendations.  ?

## 2021-08-14 NOTE — Telephone Encounter (Signed)
NA. LMTCB 

## 2021-08-15 ENCOUNTER — Other Ambulatory Visit: Payer: Self-pay

## 2021-08-15 DIAGNOSIS — E890 Postprocedural hypothyroidism: Secondary | ICD-10-CM

## 2021-08-15 NOTE — Telephone Encounter (Signed)
Patient advised of lab results

## 2021-08-15 NOTE — Telephone Encounter (Signed)
See result note from this morning. Recommend decreasing Levothyroxine dose to 50 mcg daily and recheck in 2 months.

## 2021-08-26 NOTE — Progress Notes (Signed)
? ?08/27/21 ?3:16 PM  ? ?Sonya Barnes ?24-Jun-1945 ?[id number] ? ?Referring provider:  ?Erasmo Downer, MD ?[address] ?[address] ?[place],  [place] [address] ?Chief Complaint  ?Patient presents with  ? Other  ? ? ? ? ?HPI: ?Sonya Barnes is a 76 y.o.female with a personal history of urethral caruncle, kidney stones, other stricture of urethra in female and dysuria, who presents today for a 6 week follow-up with PVR.  ? ?She underwent a cystoscopy on 05/22/2021 at which time she was found to have a true female urethral stricture and underwent dilation.   ? ?She is managed on lidocaine and topical estrogen cream.  ? ?She reports that her pain is still persistent her stream is better. She has burning on the outside that can last for 2-5 minutes. She reports that lidocaine helped temporarily.  She has not noticed any dietary reasons for her inflammation.  ? ?PMH: ?Past Medical History:  ?Diagnosis Date  ? Allergy   ? Anxiety   ? Arthritis   ? lower spine  ? Bundle branch block, right   ? Degenerative disc disease, cervical   ? pt reports no limitations  ? GERD (gastroesophageal reflux disease)   ? Hypothyroidism   ? Kidney stone   ? Osteoporosis   ? back, hips  ? Pre-diabetes   ? Tendonitis of elbow, right   ? ? ?Surgical History: ?Past Surgical History:  ?Procedure Laterality Date  ? BREAST BIOPSY Left 15 years ago  ? Core BX of calcs. Negtive  ? BREAST CYST ASPIRATION Right 15 years ago  ? Negative  ? BREAST SURGERY Left   ? biopsy  ? COLONOSCOPY N/A 02/01/2017  ? Procedure: COLONOSCOPY;  Surgeon: Midge Minium, MD;  Location: [facility];  Service: Gastroenterology;  Laterality: N/A;  ? DILATION AND CURETTAGE OF UTERUS  1956-57  ? ESOPHAGEAL DILATION N/A 02/01/2017  ? Procedure: ESOPHAGEAL DILATION;  Surgeon: Midge Minium, MD;  Location: [facility];  Service: Gastroenterology;  Laterality: N/A;  ? ESOPHAGOGASTRODUODENOSCOPY N/A 02/01/2017  ? Procedure: ESOPHAGOGASTRODUODENOSCOPY (EGD);  Surgeon:  Midge Minium, MD;  Location: [facility];  Service: Gastroenterology;  Laterality: N/A;  ? HAMMER TOE SURGERY Bilateral   ? OVARIAN CYST REMOVAL  1967  ? THYROIDECTOMY, PARTIAL    ? TONSILLECTOMY    ? ? ?Home Medications:  ?Allergies as of 08/27/2021   ?No Known Allergies ?  ? ?  ?Medication List  ?  ? ?  ? Accurate as of August 27, 2021  3:16 PM. If you have any questions, ask your nurse or doctor.  ?  ?  ? ?  ? ?ALPRAZolam 0.5 MG tablet ?Commonly known as: Prudy Feeler ?Take 0.5-1 tablets (0.25-0.5 mg total) by mouth 2 (two) times daily as needed. ?  ?estradiol 0.1 MG/GM vaginal cream ?Commonly known as: ESTRACE ?Estrogen Cream Instruction ? ?Discard applicator ? ?Apply pea sized amount to tip of finger to urethra before bed. Wash hands well after application. ?Use Monday, Wednesday and Friday ?  ?famotidine 20 MG tablet ?Commonly known as: PEPCID ?Take 20 mg by mouth 2 (two) times daily. ?  ?fluticasone 50 MCG/ACT nasal spray ?Commonly known as: FLONASE ?Place 2 sprays into the nose daily. 2 sprays each nostril ?  ?gabapentin 300 MG capsule ?Commonly known as: NEURONTIN ?Take 1 capsule (300 mg total) by mouth at bedtime. ?  ?levothyroxine 75 MCG tablet ?Commonly known as: SYNTHROID ?TAKE 1 TABLET BY MOUTH EVERY DAY ?  ?loratadine 10 MG tablet ?  Commonly known as: CLARITIN ?Take 10 mg by mouth daily. ?  ?multivitamin tablet ?Take 1 tablet by mouth daily. ?  ?omeprazole 40 MG capsule ?Commonly known as: PRILOSEC ?Take 1 capsule (40 mg total) by mouth daily. ?  ?SYSTANE ULTRA OP ?Apply to eye. ?  ?VIACTIV PO ?Take by mouth daily. ?  ? ?  ? ? ?Allergies:  ?No Known Allergies ? ?Family History: ?Family History  ?Problem Relation Age of Onset  ? Arthritis Mother   ? Cancer Mother   ?     lung  ? Lung disease Mother   ? Osteoporosis Mother   ? Diabetes Father   ? Stroke Father   ? Heart disease Father   ? Heart disease Sister   ? Lung disease Sister   ? Osteoporosis Sister   ? Colon polyps Sister   ? Early death Brother    ? Mental illness Brother   ? Liver disease Brother   ? Breast cancer Neg Hx   ? ? ?Social History:  reports that she has never smoked. She has never used smokeless tobacco. She reports that she does not drink alcohol and does not use drugs. ? ? ?Physical Exam: ?BP (!) 150/76   Pulse 61   Ht 5\' 4"  (1.626 m)   Wt 126 lb (57.2 kg)   BMI 21.63 kg/m?   ?Constitutional:  Alert and oriented, No acute distress. ?HEENT: [place] AT, moist mucus membranes.  Trachea midline, no masses. ?Cardiovascular: No clubbing, cyanosis, or edema. ?Respiratory: Normal respiratory effort, no increased work of breathing. ?Pelvic exam: normal external genitalia.  clear mucus at the urethra , no surrounding irritation exam chaperoned by Alise, very small urethral caruncle. ?Skin: No rashes, bruises or suspicious lesions. ?Neurologic: Grossly intact, no focal deficits, moving all 4 extremities. ?Psychiatric: Normal mood and affect. ? ?Laboratory Data: ? ?Lab Results  ?Component Value Date  ? CREATININE 0.87 02/11/2021  ? ?Lab Results  ?Component Value Date  ? HGBA1C 5.7 (H) 08/12/2021  ? ? ?Pertinent Imaging: ?Results for orders placed or performed in visit on 08/27/21  ?BLADDER SCAN AMB NON-IMAGING  ?Result Value Ref Range  ? Scan Result 0 ml   ? ? ?Assessment & Plan:   ? ?1. Other stricture of urethra in female ?-s/p dilation ?-- PVR 9 ml; she is emptying adequately ? ?2. urethral pain ?- She has ongoing pain/discomfort s/p urethral dilation  ?- PVR 9 ml; she is emptying adequately ?- She had an unremarkable UA 2 weeks ago ?- Will treat with topical steroid  for a month (Clobetasol) everyday to see if it will help with irritation place between clitoris and vaginal opening. If in a month it is still irritated will consider under sedation cystoscopy.  ?-continue topical estrogen cream ?- Prescribed lidocaine for pain prn ? ?Return for symptom recheck in 1 month ? ?I,Kailey Littlejohn,acting as a scribe for 10/27/21, MD.,have documented all  relevant documentation on the behalf of Vanna Scotland, MD,as directed by  Vanna Scotland, MD while in the presence of Vanna Scotland, MD. ? ?I have reviewed the above documentation for accuracy and completeness, and I agree with the above.  ? ?Vanna Scotland, MD ? ? ?[facility] ?[address] ?[place], [place] ?[phone] ? ?

## 2021-08-27 ENCOUNTER — Encounter: Payer: Self-pay | Admitting: Family Medicine

## 2021-08-27 ENCOUNTER — Ambulatory Visit: Payer: Medicare HMO | Admitting: Urology

## 2021-08-27 VITALS — BP 150/76 | HR 61 | Ht 64.0 in | Wt 126.0 lb

## 2021-08-27 DIAGNOSIS — N3582 Other urethral stricture, female: Secondary | ICD-10-CM | POA: Diagnosis not present

## 2021-08-27 LAB — BLADDER SCAN AMB NON-IMAGING: Scan Result: 0

## 2021-08-27 MED ORDER — CLOBETASOL PROPIONATE 0.05 % EX OINT: 1.0000 "application " | TOPICAL_OINTMENT | Freq: Every day | CUTANEOUS | 0 refills | Status: DC

## 2021-08-28 MED ORDER — GABAPENTIN 300 MG PO CAPS: 300.0000 mg | ORAL_CAPSULE | Freq: Every day | ORAL | 0 refills | Status: DC

## 2021-09-18 ENCOUNTER — Other Ambulatory Visit: Payer: Self-pay | Admitting: Family Medicine

## 2021-10-08 DIAGNOSIS — E890 Postprocedural hypothyroidism: Secondary | ICD-10-CM | POA: Diagnosis not present

## 2021-10-09 LAB — TSH: TSH: 0.999 u[IU]/mL (ref 0.450–4.500)

## 2021-10-15 ENCOUNTER — Ambulatory Visit: Payer: Medicare HMO | Admitting: Urology

## 2021-10-15 VITALS — BP 121/79 | HR 56 | Ht 64.5 in | Wt 128.0 lb

## 2021-10-15 DIAGNOSIS — N3582 Other urethral stricture, female: Secondary | ICD-10-CM

## 2021-10-15 LAB — BLADDER SCAN AMB NON-IMAGING: Scan Result: 0

## 2021-10-15 NOTE — Progress Notes (Signed)
10/15/21 3:52 PM   Sonya Barnes [birth year] [id number]  Referring provider:  Virginia Crews, MD [address] [place],  [place] [address] Chief Complaint  Patient presents with   Other stricture of urethra in female     HPI: Sonya Barnes is a 76 y.o.female with a personal history of urethral caruncle, kidney stones, other stricture of urethra in female and dysuria, who presents today for a follow-up.   She underwent a cystoscopy on 05/22/2021 at which time she was found to have a true female urethral stricture and underwent dilation.     She is managed on lidocaine and topical estrogen cream.   She was seen on 08/29/2021 for follow-up she was noted to have ongoing pain/ discomfort s/p urethral dilation. She was treated with Clobetasol for 1 month.   She reports today that her symptoms have improved she does have slight burning intermittently.  This is nowhere as severe as it used to be and it continues to improve.  She also feels like she is emptying her bladder completely as she did not previously.  PMH: Past Medical History:  Diagnosis Date   Allergy    Anxiety    Arthritis    lower spine   Bundle branch block, right    Degenerative disc disease, cervical    pt reports no limitations   GERD (gastroesophageal reflux disease)    Hypothyroidism    Kidney stone    Osteoporosis    back, hips   Pre-diabetes    Tendonitis of elbow, right     Surgical History: Past Surgical History:  Procedure Laterality Date   BREAST BIOPSY Left 15 years ago   Core BX of calcs. Negtive   BREAST CYST ASPIRATION Right 15 years ago   Negative   BREAST SURGERY Left    biopsy   COLONOSCOPY N/A 02/01/2017   Procedure: COLONOSCOPY;  Surgeon: Lucilla Lame, MD;  Location: [place];  Service: Gastroenterology;  Laterality: N/A;   DILATION AND CURETTAGE OF UTERUS  1956-57   ESOPHAGEAL DILATION N/A 02/01/2017   Procedure: ESOPHAGEAL DILATION;  Surgeon: Lucilla Lame,  MD;  Location: [place];  Service: Gastroenterology;  Laterality: N/A;   ESOPHAGOGASTRODUODENOSCOPY N/A 02/01/2017   Procedure: ESOPHAGOGASTRODUODENOSCOPY (EGD);  Surgeon: Lucilla Lame, MD;  Location: [facility];  Service: Gastroenterology;  Laterality: N/A;   HAMMER TOE SURGERY Bilateral    OVARIAN CYST REMOVAL  1967   THYROIDECTOMY, PARTIAL     TONSILLECTOMY      Home Medications:  Allergies as of 10/15/2021   No Known Allergies      Medication List        Accurate as of Oct 15, 2021 11:59 PM. If you have any questions, ask your nurse or doctor.          ALPRAZolam 0.5 MG tablet Commonly known as: XANAX Take 0.5-1 tablets (0.25-0.5 mg total) by mouth 2 (two) times daily as needed.   clobetasol ointment 0.05 % Commonly known as: TEMOVATE Apply 1 application. topically daily. Apply to urethra   estradiol 0.1 MG/GM vaginal cream Commonly known as: ESTRACE Estrogen Cream Instruction  Discard applicator  Apply pea sized amount to tip of finger to urethra before bed. Wash hands well after application. Use Monday, Wednesday and Friday   famotidine 20 MG tablet Commonly known as: PEPCID Take 20 mg by mouth 2 (two) times daily.   fluticasone 50 MCG/ACT nasal spray Commonly known as: FLONASE Place 2 sprays into  the nose daily. 2 sprays each nostril   gabapentin 300 MG capsule Commonly known as: NEURONTIN Take 1 capsule (300 mg total) by mouth at bedtime.   levothyroxine 75 MCG tablet Commonly known as: SYNTHROID TAKE 1 TABLET BY MOUTH EVERY DAY   loratadine 10 MG tablet Commonly known as: CLARITIN Take 10 mg by mouth daily.   multivitamin tablet Take 1 tablet by mouth daily.   omeprazole 40 MG capsule Commonly known as: PRILOSEC Take 1 capsule (40 mg total) by mouth daily.   SYSTANE ULTRA OP Apply to eye.   VIACTIV PO Take by mouth daily.        Allergies:  No Known Allergies  Family History: Family History  Problem  Relation Age of Onset   Arthritis Mother    Cancer Mother        lung   Lung disease Mother    Osteoporosis Mother    Diabetes Father    Stroke Father    Heart disease Father    Heart disease Sister    Lung disease Sister    Osteoporosis Sister    Colon polyps Sister    Early death Brother    Mental illness Brother    Liver disease Brother    Breast cancer Neg Hx     Social History:  reports that she has never smoked. She has never used smokeless tobacco. She reports that she does not drink alcohol and does not use drugs.   Physical Exam: BP 121/79   Pulse (!) 56   Ht 5' 4.5" (1.638 m)   Wt 128 lb (58.1 kg)   BMI 21.63 kg/m   Constitutional:  Alert and oriented, No acute distress. HEENT: [place] AT, moist mucus membranes.  Trachea midline, no masses. Cardiovascular: No clubbing, cyanosis, or edema. Respiratory: Normal respiratory effort, no increased work of breathing. Skin: No rashes, bruises or suspicious lesions. Neurologic: Grossly intact, no focal deficits, moving all 4 extremities. Psychiatric: Normal mood and affect.  Laboratory Data:  Lab Results  Component Value Date   CREATININE 0.87 02/11/2021   Lab Results  Component Value Date   HGBA1C 5.7 (H) 08/12/2021    Pertinent Imaging: Results for orders placed or performed in visit on 10/15/21  BLADDER SCAN AMB NON-IMAGING  Result Value Ref Range   Scan Result 0 ml     Assessment & Plan:   History of urethral stricture in female - S/p Dilation  - PVR 72ml; she is emptying adequately today  - Continue topical estrogen cream  Urethral pain  - Her symptoms have improved  - Will have her alternate days between estrogen and clobetasol to ween off of clobetasol.    F/u 1 year with pvr  Conley Rolls as a scribe for Hollice Espy, MD.,have documented all relevant documentation on the behalf of Hollice Espy, MD,as directed by  Hollice Espy, MD while in the presence of Hollice Espy, MD.  I  have reviewed the above documentation for accuracy and completeness, and I agree with the above.   Hollice Espy, MD  [facility] [address] [place], [place] [address] [phone]

## 2021-11-20 DIAGNOSIS — H524 Presbyopia: Secondary | ICD-10-CM | POA: Diagnosis not present

## 2021-11-26 ENCOUNTER — Other Ambulatory Visit: Payer: Self-pay | Admitting: Family Medicine

## 2021-12-17 DIAGNOSIS — H43393 Other vitreous opacities, bilateral: Secondary | ICD-10-CM | POA: Diagnosis not present

## 2021-12-17 DIAGNOSIS — H2513 Age-related nuclear cataract, bilateral: Secondary | ICD-10-CM | POA: Diagnosis not present

## 2021-12-17 DIAGNOSIS — H02831 Dermatochalasis of right upper eyelid: Secondary | ICD-10-CM | POA: Diagnosis not present

## 2021-12-17 DIAGNOSIS — H18413 Arcus senilis, bilateral: Secondary | ICD-10-CM | POA: Diagnosis not present

## 2021-12-17 DIAGNOSIS — H2511 Age-related nuclear cataract, right eye: Secondary | ICD-10-CM | POA: Diagnosis not present

## 2022-01-01 ENCOUNTER — Other Ambulatory Visit: Payer: Self-pay | Admitting: Family Medicine

## 2022-01-01 DIAGNOSIS — Z1231 Encounter for screening mammogram for malignant neoplasm of breast: Secondary | ICD-10-CM

## 2022-01-04 ENCOUNTER — Other Ambulatory Visit: Payer: Self-pay | Admitting: Family Medicine

## 2022-02-02 ENCOUNTER — Telehealth: Payer: Self-pay

## 2022-02-02 DIAGNOSIS — Z23 Encounter for immunization: Secondary | ICD-10-CM | POA: Diagnosis not present

## 2022-02-02 NOTE — Telephone Encounter (Signed)
Copied from [place] [id number]. Topic: Complaint - Provider (non-sensitive) >> Feb 02, 2022  4:06 PM Tiffany B wrote: Patient was very upset she received a text message stating she needed an appointment because a provider she sees is on FMLA. No further information was given. Patient stated "the way people are scamming older people" she does not appreciate how this was communicated. Patient states she has had this physical scheduled for  6 months and PCP has been pregnant for 9 months why is she just being informed now. Patient does not want to [facility] with another provider and does not want to wait until next year to see PCP but would like her physical [facility] in November with PCP only. Patient requesting to speak with Practice Admin.

## 2022-02-02 NOTE — Telephone Encounter (Signed)
Copied from CRM #[id number]. Topic: Complaint - Provider (non-sensitive) >> Feb 02, 2022  4:06 PM Tiffany B wrote: Patient was very upset she received a text message stating she needed an appointment because a provider she sees is on FMLA. No further information was given. Patient stated "the way people are scamming older people" she does not appreciate how this was communicated. Patient states she has had this physical scheduled for  6 months and PCP has been pregnant for 9 months why is she just being informed now. Patient does not want to [facility] with another provider and does not want to wait until next year to see PCP but would like her physical [facility] in November with PCP only. Patient requesting to speak with Practice Admin. 

## 2022-02-04 ENCOUNTER — Encounter: Payer: Self-pay | Admitting: Family Medicine

## 2022-02-04 ENCOUNTER — Ambulatory Visit (INDEPENDENT_AMBULATORY_CARE_PROVIDER_SITE_OTHER): Payer: Medicare HMO | Admitting: Family Medicine

## 2022-02-04 VITALS — BP 135/76 | HR 61 | Temp 97.8°F | Resp 16 | Ht 64.0 in | Wt 126.9 lb

## 2022-02-04 DIAGNOSIS — Z0000 Encounter for general adult medical examination without abnormal findings: Secondary | ICD-10-CM | POA: Diagnosis not present

## 2022-02-04 DIAGNOSIS — R7303 Prediabetes: Secondary | ICD-10-CM

## 2022-02-04 DIAGNOSIS — E890 Postprocedural hypothyroidism: Secondary | ICD-10-CM | POA: Diagnosis not present

## 2022-02-04 DIAGNOSIS — E7800 Pure hypercholesterolemia, unspecified: Secondary | ICD-10-CM | POA: Diagnosis not present

## 2022-02-04 NOTE — Progress Notes (Addendum)
Annual Wellness Visit    I,[name],acting as a scribe for [employer], MD.,have documented all relevant documentation on the behalf of Sonya Foster, MD,as directed by  Sonya Foster, MD while in the presence of Sonya Foster, MD.   Patient: Sonya Barnes, Female    DOB: [birth year], 76 y.o.   MRN: [id number] Visit Date: 02/04/2022  Today's Provider: [name], MD   Chief Complaint  Patient presents with   Annual Exam   Subjective    Sonya Barnes is a 76 y.o. female who presents today for her Annual Wellness Visit.  She reports consuming a general diet. Home exercise routine includes walks 5-6 times a week for an hour. She generally feels well. She reports sleeping fairly well. She does not have additional problems to discuss today.    Medications: Outpatient Medications Prior to Visit  Medication Sig   ALPRAZolam (XANAX) 0.5 MG tablet Take 0.5-1 tablets (0.25-0.5 mg total) by mouth 2 (two) times daily as needed.   Calcium-Vitamin D-Vitamin K (VIACTIV PO) Take by mouth daily.   clobetasol ointment (TEMOVATE) 0.51 % Apply 1 application. topically daily. Apply to urethra   estradiol (ESTRACE) 0.1 MG/GM vaginal cream Estrogen Cream Instruction  Discard applicator  Apply pea sized amount to tip of finger to urethra before bed. Wash hands well after application. Use Monday, Wednesday and Friday   famotidine (PEPCID) 20 MG tablet Take 20 mg by mouth 2 (two) times daily.   fluticasone (FLONASE) 50 MCG/ACT nasal spray Place 2 sprays into the nose daily. 2 sprays each nostril   loratadine (CLARITIN) 10 MG tablet Take 10 mg by mouth daily.   Multiple Vitamin (MULTIVITAMIN) tablet Take 1 tablet by mouth daily.   omeprazole (PRILOSEC) 40 MG capsule Take 1 capsule (40 mg total) by mouth daily.   Polyethyl Glycol-Propyl Glycol (SYSTANE ULTRA OP) Apply to eye.   [DISCONTINUED] gabapentin (NEURONTIN) 300 MG capsule TAKE  1 CAPSULE BY MOUTH AT BEDTIME.   [DISCONTINUED] levothyroxine (SYNTHROID) 75 MCG tablet TAKE 1 TABLET BY MOUTH EVERY DAY   No facility-administered medications prior to visit.    No Known Allergies  Patient Care Team: Virginia Crews, MD as PCP - General (Family Medicine)  Review of Systems  All other systems reviewed and are negative.       Objective    Vitals: BP 135/76 (BP Location: Left Arm, Patient Position: Sitting, Cuff Size: Normal)   Pulse 61   Temp 97.8 F (36.6 C) (Oral)   Resp 16   Ht 5' 4"  (1.626 m)   Wt 126 lb 14.4 oz (57.6 kg)   BMI 21.78 kg/m     Physical Exam Constitutional:      General: She is not in acute distress.    Appearance: Normal appearance. She is not ill-appearing, toxic-appearing or diaphoretic.  HENT:     Right Ear: Tympanic membrane and external ear normal. There is no impacted cerumen.     Left Ear: Tympanic membrane and external ear normal. There is no impacted cerumen.     Nose: Nose normal.     Mouth/Throat:     Pharynx: Oropharynx is clear.  Eyes:     General: No scleral icterus.       Right eye: No discharge.        Left eye: No discharge.     Extraocular Movements: Extraocular movements intact.     Conjunctiva/sclera: Conjunctivae normal.     Pupils: Pupils are equal, round,  and reactive to light.  Cardiovascular:     Rate and Rhythm: Normal rate and regular rhythm.     Pulses: Normal pulses.     Heart sounds: Normal heart sounds. No murmur heard.    No friction rub. No gallop.  Pulmonary:     Effort: Pulmonary effort is normal. No respiratory distress.     Breath sounds: Normal breath sounds. No wheezing, rhonchi or rales.  Abdominal:     General: Bowel sounds are normal. There is no distension.     Palpations: Abdomen is soft. There is no mass.     Tenderness: There is no abdominal tenderness. There is no guarding.  Musculoskeletal:        General: No swelling, tenderness, deformity or signs of injury.      Cervical back: Normal range of motion and neck supple. No rigidity or tenderness.     Right lower leg: No edema.     Left lower leg: No edema.  Lymphadenopathy:     Cervical: No cervical adenopathy.  Skin:    General: Skin is warm.     Capillary Refill: Capillary refill takes less than 2 seconds.     Findings: No erythema or rash.  Neurological:     General: No focal deficit present.     Mental Status: She is alert and oriented to person, place, and time.     Motor: No weakness.     Gait: Gait normal.  Psychiatric:        Mood and Affect: Mood normal.        Behavior: Behavior normal.    Most recent functional status assessment:    08/11/2021   11:22 AM  In your present state of health, do you have any difficulty performing the following activities:  Hearing? 0  Vision? 0  Difficulty concentrating or making decisions? 1  Walking or climbing stairs? 0  Dressing or bathing? 0  Doing errands, shopping? 0   Most recent fall risk assessment:    02/04/2022    2:27 PM  [place] in the past year? 0  Number falls in past yr: 0  Injury with Fall? 0  Risk for fall due to : No Fall Risks    Most recent depression screenings:    02/04/2022    2:27 PM 08/11/2021   11:22 AM  PHQ 2/9 Scores  PHQ - 2 Score 2 2  PHQ- 9 Score 6 4   Most recent cognitive screening:    02/23/2019   10:11 AM  6CIT Screen  What Year? 0 points  What month? 0 points  What time? 0 points  Count back from 20 0 points  Months in reverse 0 points  Repeat phrase 0 points  Total Score 0 points   Most recent Audit-C alcohol use screening    08/11/2021   11:22 AM  Alcohol Use Disorder Test (AUDIT)  1. How often do you have a drink containing alcohol? 0  2. How many drinks containing alcohol do you have on a typical day when you are drinking? 0  3. How often do you have six or more drinks on one occasion? 0  AUDIT-C Score 0   A score of 3 or more in women, and 4 or more in men indicates  increased risk for alcohol abuse, EXCEPT if all of the points are from question 1   Results for orders placed or performed in visit on 02/04/22  Lipid panel  Result Value  Ref Range   Cholesterol, Total 228 (H) 100 - 199 mg/dL   Triglycerides 146 0 - 149 mg/dL   HDL 60 >39 mg/dL   VLDL Cholesterol Cal 26 5 - 40 mg/dL   LDL Chol Calc (NIH) 142 (H) 0 - 99 mg/dL   Chol/HDL Ratio 3.8 0.0 - 4.4 ratio  TSH + free T4  Result Value Ref Range   TSH 0.064 (L) 0.450 - 4.500 uIU/mL   Free T4 1.82 (H) 0.82 - 1.77 ng/dL  Hemoglobin A1c  Result Value Ref Range   Hgb A1c MFr Bld 5.8 (H) 4.8 - 5.6 %   Est. average glucose Bld gHb Est-mCnc 120 mg/dL  Comprehensive metabolic panel  Result Value Ref Range   Glucose 98 70 - 99 mg/dL   BUN 14 8 - 27 mg/dL   Creatinine, Ser 0.80 0.57 - 1.00 mg/dL   eGFR 76 >59 mL/min/1.73   BUN/Creatinine Ratio 18 12 - 28   Sodium 140 134 - 144 mmol/L   Potassium 4.1 3.5 - 5.2 mmol/L   Chloride 102 96 - 106 mmol/L   CO2 26 20 - 29 mmol/L   Calcium 10.1 8.7 - 10.3 mg/dL   Total Protein 6.3 6.0 - 8.5 g/dL   Albumin 4.3 3.8 - 4.8 g/dL   Globulin, Total 2.0 1.5 - 4.5 g/dL   Albumin/Globulin Ratio 2.2 1.2 - 2.2   Bilirubin Total 0.2 0.0 - 1.2 mg/dL   Alkaline Phosphatase 99 44 - 121 IU/L   AST 23 0 - 40 IU/L   ALT 12 0 - 32 IU/L    Assessment & Plan     Annual wellness visit done today including the all of the following: Reviewed patient's Family Medical History Reviewed and updated list of patient's medical providers Assessment of cognitive impairment was done Assessed patient's functional ability Established a written schedule for health screening services Health Risk Assessent Completed and Reviewed  Exercise Activities and Dietary recommendations  Goals      Exercise 150 minutes per week (moderate activity)     Increase water intake     Recommend increasing water intake to 6-8 glasses of water a day.         Immunization History  Administered  Date(s) Administered   DTaP 05/18/1996   Fluad Quad(high Dose 65+) 02/23/2019, 02/06/2020, 02/22/2021, 02/02/2022   Influenza, High Dose Seasonal PF 02/27/2016, 02/11/2017, 01/21/2018   Influenza-Unspecified 01/17/2015   PFIZER(Purple Top)SARS-COV-2 Vaccination 06/23/2019, 07/17/2019   Pfizer Covid-19 Vaccine Bivalent Booster 68yr & up 04/22/2020, 12/03/2020   Pneumococcal Conjugate-13 01/26/2014   Pneumococcal Polysaccharide-23 02/15/2015   Td 02/23/2019   Zoster Recombinat (Shingrix) 11/14/2020, 01/30/2021   Zoster, Live 12/24/2010    Health Maintenance  Topic Date Due   COLONOSCOPY (Pts [phone] coverage will need to be confirmed)  02/01/2022   Medicare Annual Wellness (AWV)  02/05/2023   TETANUS/TDAP  02/22/2029   Pneumonia Vaccine 6529Years old  Completed   INFLUENZA VACCINE  Completed   DEXA SCAN  Completed   COVID-19 Vaccine  Completed   Hepatitis C Screening  Completed   Zoster Vaccines- Shingrix  Completed   HPV VACCINES  Aged Out     Discussed health benefits of physical activity, and encouraged her to engage in regular exercise appropriate for her age and condition.    Problem List Items Addressed This Visit       Endocrine   Hypothyroidism associated with surgical procedure   Relevant Orders   TSH + free  T4 (Completed)     Other   Hypercholesteremia   Relevant Orders   Lipid panel (Completed)   Hemoglobin A1c (Completed)   Medicare annual wellness visit, subsequent - Primary   Relevant Orders   Hemoglobin A1c (Completed)   Comprehensive metabolic panel (Completed)   Pre-diabetes    Last A1c 5.7 in Mar 2023, we will recheck levels today       Relevant Orders   Hemoglobin A1c (Completed)   Other Visit Diagnoses     Encounter for annual physical exam       Relevant Orders   Lipid panel (Completed)   TSH + free T4 (Completed)   Hemoglobin A1c (Completed)        Return in about 6 months (around 08/05/2022) for chronic conditions .      I, Sonya Foster, MD, have reviewed all documentation for this visit. The documentation on 03/17/22 for the exam, diagnosis, procedures, and orders are all accurate and complete.    Sonya Foster, MD  [facility] [phone] (phone) [phone] (fax)  [place]

## 2022-02-05 ENCOUNTER — Ambulatory Visit
Admission: RE | Admit: 2022-02-05 | Discharge: 2022-02-05 | Disposition: A | Discharge status: Home / Self Care | Payer: Medicare HMO | Source: Ambulatory Visit | Attending: Family Medicine | Admitting: Family Medicine

## 2022-02-05 ENCOUNTER — Other Ambulatory Visit: Payer: Self-pay | Admitting: Family Medicine

## 2022-02-05 DIAGNOSIS — Z1231 Encounter for screening mammogram for malignant neoplasm of breast: Secondary | ICD-10-CM

## 2022-02-05 LAB — COMPREHENSIVE METABOLIC PANEL
ALT: 12 IU/L (ref 0–32)
AST: 23 IU/L (ref 0–40)
Albumin/Globulin Ratio: 2.2 (ref 1.2–2.2)
Albumin: 4.3 g/dL (ref 3.8–4.8)
Alkaline Phosphatase: 99 IU/L (ref 44–121)
BUN/Creatinine Ratio: 18 (ref 12–28)
BUN: 14 mg/dL (ref 8–27)
Bilirubin Total: 0.2 mg/dL (ref 0.0–1.2)
CO2: 26 mmol/L (ref 20–29)
Calcium: 10.1 mg/dL (ref 8.7–10.3)
Chloride: 102 mmol/L (ref 96–106)
Creatinine, Ser: 0.8 mg/dL (ref 0.57–1.00)
Globulin, Total: 2 g/dL (ref 1.5–4.5)
Glucose: 98 mg/dL (ref 70–99)
Potassium: 4.1 mmol/L (ref 3.5–5.2)
Sodium: 140 mmol/L (ref 134–144)
Total Protein: 6.3 g/dL (ref 6.0–8.5)
eGFR: 76 mL/min/{1.73_m2} (ref >59)

## 2022-02-05 LAB — HEMOGLOBIN A1C
Est. average glucose Bld gHb Est-mCnc: 120 mg/dL
Hgb A1c MFr Bld: 5.8 % — ABNORMAL HIGH (ref 4.8–5.6)

## 2022-02-05 LAB — TSH+FREE T4
Free T4: 1.82 ng/dL — ABNORMAL HIGH (ref 0.82–1.77)
TSH: 0.064 u[IU]/mL — ABNORMAL LOW (ref 0.450–4.500)

## 2022-02-05 LAB — LIPID PANEL
Chol/HDL Ratio: 3.8 ratio (ref 0.0–4.4)
Cholesterol, Total: 228 mg/dL — ABNORMAL HIGH (ref 100–199)
HDL: 60 mg/dL (ref >39)
LDL Chol Calc (NIH): 142 mg/dL — ABNORMAL HIGH (ref 0–99)
Triglycerides: 146 mg/dL (ref 0–149)
VLDL Cholesterol Cal: 26 mg/dL (ref 5–40)

## 2022-02-05 MED ORDER — LEVOTHYROXINE SODIUM 50 MCG PO TABS: 50.0000 ug | ORAL_TABLET | Freq: Every day | ORAL | 0 refills | Status: DC

## 2022-02-06 NOTE — Progress Notes (Signed)
Hi Sammi  Normal mammogram; repeat in 1 year.  Please let us know if you have any questions.  Thank you,  Tenley Winward, FNP 

## 2022-02-11 ENCOUNTER — Other Ambulatory Visit: Payer: Self-pay | Admitting: Family Medicine

## 2022-02-12 ENCOUNTER — Encounter: Payer: Self-pay | Admitting: Family Medicine

## 2022-02-13 ENCOUNTER — Ambulatory Visit: Payer: Medicare HMO | Admitting: Family Medicine

## 2022-03-17 DIAGNOSIS — R7303 Prediabetes: Secondary | ICD-10-CM | POA: Insufficient documentation

## 2022-03-17 NOTE — Assessment & Plan Note (Signed)
Last A1c 5.7 in Mar 2023, we will recheck levels today

## 2022-04-02 ENCOUNTER — Other Ambulatory Visit: Payer: Self-pay | Admitting: Family Medicine

## 2022-04-02 NOTE — Telephone Encounter (Signed)
Interface surescripts requesting refill. Medication dose discontinued 02/05/22.  Requested Prescriptions  Refused Prescriptions Disp Refills   levothyroxine (SYNTHROID) 75 MCG tablet [Pharmacy Med Name: LEVOTHYROXINE 75 MCG TABLET] 90 tablet 0    Sig: TAKE 1 TABLET BY MOUTH EVERY DAY     Endocrinology:  Hypothyroid Agents Failed - 04/02/2022  2:06 AM      Failed - TSH in normal range and within 360 days    TSH  Date Value Ref Range Status  02/04/2022 0.064 (L) 0.450 - 4.500 uIU/mL Final         Passed - Valid encounter within last 12 months    Recent Outpatient Visits           1 month ago Medicare annual wellness visit, subsequent   [employer], [place], [place]   7 months ago Hypothyroidism associated with surgical procedure   [facility] [place], Marzella Schlein, MD   1 year ago Hypothyroidism associated with surgical procedure   [facility] [place], Marzella Schlein, MD   1 year ago Medicare annual wellness visit, subsequent   [facility] [place], Marzella Schlein, MD   2 years ago Internal hemorrhoids   [facility] [place], Marzella Schlein, MD       Future Appointments             In 4 months Bacigalupo, Marzella Schlein, MD [facility]   In 6 months Vanna Scotland, MD [facility]

## 2022-04-03 ENCOUNTER — Other Ambulatory Visit: Payer: Self-pay | Admitting: Family Medicine

## 2022-04-03 DIAGNOSIS — K219 Gastro-esophageal reflux disease without esophagitis: Secondary | ICD-10-CM

## 2022-04-14 ENCOUNTER — Other Ambulatory Visit: Payer: Self-pay | Admitting: Family Medicine

## 2022-04-14 ENCOUNTER — Other Ambulatory Visit: Payer: Self-pay

## 2022-04-14 DIAGNOSIS — E890 Postprocedural hypothyroidism: Secondary | ICD-10-CM

## 2022-04-15 LAB — TSH+FREE T4
Free T4: 1.24 ng/dL (ref 0.82–1.77)
TSH: 3.34 u[IU]/mL (ref 0.450–4.500)

## 2022-04-20 ENCOUNTER — Telehealth: Payer: Self-pay | Admitting: Family Medicine

## 2022-04-20 NOTE — Telephone Encounter (Signed)
Patient has been advised 04/17/22.

## 2022-04-20 NOTE — Telephone Encounter (Signed)
Results of thyroid studies have normalized.   Ronnald Ramp, MD  [facility]

## 2022-04-27 ENCOUNTER — Other Ambulatory Visit: Payer: Self-pay | Admitting: Family Medicine

## 2022-05-26 ENCOUNTER — Other Ambulatory Visit: Payer: Self-pay | Admitting: Family Medicine

## 2022-05-26 NOTE — Telephone Encounter (Signed)
Requested Prescriptions  Pending Prescriptions Disp Refills   gabapentin (NEURONTIN) 300 MG capsule [Pharmacy Med Name: GABAPENTIN 300 MG CAP] 90 capsule 2    Sig: TAKE 1 CAPSULE BY MOUTH AT BEDTIME.     Neurology: Anticonvulsants - gabapentin Passed - 05/26/2022 11:39 AM      Passed - Cr in normal range and within 360 days    Creatinine  Date Value Ref Range Status  06/27/2012 0.69 0.60 - 1.30 mg/dL Final   Creatinine, Ser  Date Value Ref Range Status  02/04/2022 0.80 0.57 - 1.00 mg/dL Final         Passed - Completed PHQ-2 or PHQ-9 in the last 360 days      Passed - Valid encounter within last 12 months    Recent Outpatient Visits           3 months ago Medicare annual wellness visit, subsequent   [place], [place]   9 months ago Hypothyroidism associated with surgical procedure   [facility] [place], Dionne Bucy, MD   1 year ago Hypothyroidism associated with surgical procedure   [facility] [place], Dionne Bucy, MD   1 year ago Medicare annual wellness visit, subsequent   [facility] [place], Dionne Bucy, MD   2 years ago Internal hemorrhoids   [facility] [place], Dionne Bucy, MD       Future Appointments             In 2 months Bacigalupo, Dionne Bucy, MD [facility] [place]   In 4 months Hollice Espy, [place]

## 2022-07-02 DIAGNOSIS — H04123 Dry eye syndrome of bilateral lacrimal glands: Secondary | ICD-10-CM | POA: Diagnosis not present

## 2022-07-02 DIAGNOSIS — H02834 Dermatochalasis of left upper eyelid: Secondary | ICD-10-CM | POA: Diagnosis not present

## 2022-07-02 DIAGNOSIS — H02831 Dermatochalasis of right upper eyelid: Secondary | ICD-10-CM | POA: Diagnosis not present

## 2022-07-02 DIAGNOSIS — H25813 Combined forms of age-related cataract, bilateral: Secondary | ICD-10-CM | POA: Diagnosis not present

## 2022-07-06 DIAGNOSIS — H25813 Combined forms of age-related cataract, bilateral: Secondary | ICD-10-CM | POA: Diagnosis not present

## 2022-07-06 DIAGNOSIS — H25812 Combined forms of age-related cataract, left eye: Secondary | ICD-10-CM | POA: Diagnosis not present

## 2022-07-08 DIAGNOSIS — H268 Other specified cataract: Secondary | ICD-10-CM | POA: Diagnosis not present

## 2022-07-08 DIAGNOSIS — H25812 Combined forms of age-related cataract, left eye: Secondary | ICD-10-CM | POA: Diagnosis not present

## 2022-07-16 DIAGNOSIS — H25811 Combined forms of age-related cataract, right eye: Secondary | ICD-10-CM | POA: Diagnosis not present

## 2022-07-22 ENCOUNTER — Other Ambulatory Visit: Payer: Self-pay | Admitting: Family Medicine

## 2022-07-22 NOTE — Telephone Encounter (Signed)
Requested Prescriptions  Pending Prescriptions Disp Refills   levothyroxine (SYNTHROID) 50 MCG tablet [Pharmacy Med Name: LEVOTHYROXINE 50 MCG TABLET] 90 tablet 0    Sig: TAKE 1 TABLET BY MOUTH EVERY DAY     Endocrinology:  Hypothyroid Agents Passed - 07/22/2022  9:52 AM      Passed - TSH in normal range and within 360 days    TSH  Date Value Ref Range Status  04/14/2022 3.340 0.450 - 4.500 uIU/mL Final         Passed - Valid encounter within last 12 months    Recent Outpatient Visits           5 months ago Medicare annual wellness visit, subsequent   [place], [place]   11 months ago Hypothyroidism associated with surgical procedure   [place], Dionne Bucy, MD   1 year ago Hypothyroidism associated with surgical procedure   [place], Dionne Bucy, MD   1 year ago Medicare annual wellness visit, subsequent   [place], Dionne Bucy, MD   2 years ago Internal hemorrhoids   [place], Dionne Bucy, MD       Future Appointments             In 3 weeks Bacigalupo, Dionne Bucy, MD [facility]

## 2022-08-05 DIAGNOSIS — H25811 Combined forms of age-related cataract, right eye: Secondary | ICD-10-CM | POA: Diagnosis not present

## 2022-08-05 DIAGNOSIS — H268 Other specified cataract: Secondary | ICD-10-CM | POA: Diagnosis not present

## 2022-08-06 ENCOUNTER — Ambulatory Visit: Payer: Medicare HMO | Admitting: Family Medicine

## 2022-08-10 ENCOUNTER — Ambulatory Visit: Payer: Medicare HMO | Admitting: Family Medicine

## 2022-08-13 ENCOUNTER — Encounter: Payer: Self-pay | Admitting: Family Medicine

## 2022-08-13 ENCOUNTER — Ambulatory Visit (INDEPENDENT_AMBULATORY_CARE_PROVIDER_SITE_OTHER): Payer: PPO | Admitting: Family Medicine

## 2022-08-13 VITALS — BP 113/83 | HR 66 | Temp 97.8°F | Resp 12 | Wt 128.0 lb

## 2022-08-13 DIAGNOSIS — E890 Postprocedural hypothyroidism: Secondary | ICD-10-CM | POA: Diagnosis not present

## 2022-08-13 DIAGNOSIS — B009 Herpesviral infection, unspecified: Secondary | ICD-10-CM | POA: Diagnosis not present

## 2022-08-13 DIAGNOSIS — R7303 Prediabetes: Secondary | ICD-10-CM

## 2022-08-13 DIAGNOSIS — F419 Anxiety disorder, unspecified: Secondary | ICD-10-CM | POA: Diagnosis not present

## 2022-08-13 DIAGNOSIS — E7800 Pure hypercholesterolemia, unspecified: Secondary | ICD-10-CM

## 2022-08-13 MED ORDER — VALACYCLOVIR HCL 1 G PO TABS: 1000.0000 mg | ORAL_TABLET | Freq: Two times a day (BID) | ORAL | 0 refills | Status: AC

## 2022-08-13 MED ORDER — LEVOTHYROXINE SODIUM 50 MCG PO TABS: 50.0000 ug | ORAL_TABLET | Freq: Every day | ORAL | 3 refills | Status: DC

## 2022-08-13 NOTE — Assessment & Plan Note (Signed)
Recommend low carb diet °Recheck A1c  °

## 2022-08-13 NOTE — Assessment & Plan Note (Signed)
Chronic and well-controlled Continue using Xanax very sparingly

## 2022-08-13 NOTE — Progress Notes (Signed)
I,Sulibeya S Dimas,acting as a scribe for Lavon Paganini, MD.,have documented all relevant documentation on the behalf of Lavon Paganini, MD,as directed by  Lavon Paganini, MD while in the presence of Lavon Paganini, MD.     Established patient visit   Patient: Sonya Barnes   DOB: 22-Apr-1946   77 y.o. Female  MRN: [id number] Visit Date: 08/13/2022  Today's healthcare provider: Lavon Paganini, MD   Chief Complaint  Patient presents with   Hyperglycemia   Hyperlipidemia   Hypothyroidism   Subjective     Lipid/Cholesterol, Follow-up  Last lipid panel Other pertinent labs  Lab Results  Component Value Date   CHOL 228 (H) 02/04/2022   HDL 60 02/04/2022   LDLCALC 142 (H) 02/04/2022   TRIG 146 02/04/2022   CHOLHDL 3.8 02/04/2022   Lab Results  Component Value Date   ALT 12 02/04/2022   AST 23 02/04/2022   PLT 328 02/23/2019   TSH 3.340 04/14/2022     She was last seen for this 6 months ago.  Management since that visit includes no changes.  She reports excellent compliance with treatment. She is not having side effects.   The 10-year ASCVD risk score (Arnett DK, et al., 2019) is: 14.4%  --------------------------------------------------------------------------------------------------- Prediabetes, Follow-up  Lab Results  Component Value Date   HGBA1C 5.8 (H) 02/04/2022   HGBA1C 5.7 (H) 08/12/2021   HGBA1C 5.5 02/10/2021   GLUCOSE 98 02/04/2022   GLUCOSE 76 02/11/2021   GLUCOSE 92 08/15/2020    Last seen for for this6 months ago.  Management since that visit includes no changes. Current symptoms include none and have been stable.  Pertinent Labs:    Component Value Date/Time   CHOL 228 (H) 02/04/2022 1502   CHOL 205 (H) 02/01/2014 1228   TRIG 146 02/04/2022 1502   TRIG 160 02/01/2014 1228   CHOLHDL 3.8 02/04/2022 1502   CHOLHDL 3.9 01/11/2017 1320   CREATININE 0.80 02/04/2022 1502   CREATININE 0.69 06/27/2012 1710    Wt Readings  from Last 3 Encounters:  08/13/22 128 lb (58.1 kg)  02/04/22 126 lb 14.4 oz (57.6 kg)  10/15/21 128 lb (58.1 kg)    ----------------------------------------------------------------------------------------- Hypothyroid, follow-up  Lab Results  Component Value Date   TSH 3.340 04/14/2022   TSH 0.064 (L) 02/04/2022   TSH 0.999 10/08/2021   FREET4 1.24 04/14/2022   FREET4 1.82 (H) 02/04/2022   T4TOTAL 8.2 01/07/2016    Wt Readings from Last 3 Encounters:  08/13/22 128 lb (58.1 kg)  02/04/22 126 lb 14.4 oz (57.6 kg)  10/15/21 128 lb (58.1 kg)    She was last seen for hypothyroid 6 months ago.  Management since that visit includes start lower dose of synthroid daily 50 mcg. Recheck level in 2 months.  She reports excellent compliance with treatment. She is not having side effects.  -----------------------------------------------------------------------------------------  Irritation of labia - present for almost one year Burning externally with urination No itching or discharge, no rash Using topical estrogen 3 x weekly externally  Medications: Outpatient Medications Prior to Visit  Medication Sig   ALPRAZolam (XANAX) 0.5 MG tablet Take 0.5-1 tablets (0.25-0.5 mg total) by mouth 2 (two) times daily as needed.   Calcium-Vitamin D-Vitamin K (VIACTIV PO) Take by mouth daily.   clobetasol ointment (TEMOVATE) [id number] % Apply 1 application. topically daily. Apply to urethra   estradiol (ESTRACE) 0.1 MG/GM vaginal cream Estrogen Cream Instruction  Discard applicator  Apply pea sized amount to tip of finger to  urethra before bed. Wash hands well after application. Use Monday, Wednesday and Friday   famotidine (PEPCID) 20 MG tablet Take 20 mg by mouth 2 (two) times daily.   gabapentin (NEURONTIN) 300 MG capsule TAKE 1 CAPSULE BY MOUTH AT BEDTIME.   loratadine (CLARITIN) 10 MG tablet Take 10 mg by mouth daily.   Multiple Vitamin (MULTIVITAMIN) tablet Take 1 tablet by mouth daily.    omeprazole (PRILOSEC) 40 MG capsule TAKE 1 CAPSULE (40 MG TOTAL) BY MOUTH DAILY.   Polyethyl Glycol-Propyl Glycol (SYSTANE ULTRA OP) Apply to eye.   [DISCONTINUED] levothyroxine (SYNTHROID) 50 MCG tablet TAKE 1 TABLET BY MOUTH EVERY DAY   [DISCONTINUED] fluticasone (FLONASE) 50 MCG/ACT nasal spray Place 2 sprays into the nose daily. 2 sprays each nostril (Patient not taking: Reported on 08/13/2022)   No facility-administered medications prior to visit.    Review of Systems  Constitutional:  Negative for appetite change, chills, fatigue, fever and unexpected weight change.  Eyes:  Negative for visual disturbance.  Respiratory:  Negative for cough, chest tightness and shortness of breath.   Cardiovascular:  Negative for chest pain, palpitations and leg swelling.  Gastrointestinal:  Negative for abdominal pain, constipation, diarrhea, nausea and vomiting.  Endocrine: Negative for cold intolerance and heat intolerance.  Neurological:  Positive for light-headedness. Negative for dizziness, numbness and headaches.       Objective    BP 113/83 (BP Location: Left Arm, Patient Position: Sitting, Cuff Size: Normal)   Pulse 66   Temp 97.8 F (36.6 C) (Temporal)   Resp 12   Wt 128 lb (58.1 kg)   SpO2 98%   BMI 21.97 kg/m    Physical Exam Vitals reviewed.  Constitutional:      General: She is not in acute distress.    Appearance: Normal appearance. She is well-developed. She is not diaphoretic.  HENT:     Head: Normocephalic and atraumatic.  Eyes:     General: No scleral icterus.    Conjunctiva/sclera: Conjunctivae normal.  Neck:     Thyroid: No thyromegaly.  Cardiovascular:     Rate and Rhythm: Normal rate and regular rhythm.     Pulses: Normal pulses.     Heart sounds: Normal heart sounds. No murmur heard. Pulmonary:     Effort: Pulmonary effort is normal. No respiratory distress.     Breath sounds: Normal breath sounds. No wheezing, rhonchi or rales.  Genitourinary:     Comments: External vulvar exam shows clustered vesicles on erythematous base and one ulceration primarily of L labia majora Musculoskeletal:     Cervical back: Neck supple.     Right lower leg: No edema.     Left lower leg: No edema.  Lymphadenopathy:     Cervical: No cervical adenopathy.  Skin:    General: Skin is warm and dry.     Findings: No rash.  Neurological:     Mental Status: She is alert and oriented to person, place, and time. Mental status is at baseline.  Psychiatric:        Mood and Affect: Mood normal.        Behavior: Behavior normal.       No results found for any visits on 08/13/22.  Assessment & Plan     Problem List Items Addressed This Visit       Endocrine   Hypothyroidism associated with surgical procedure - Primary    Previously well controlled Continue Synthroid at current dose  Recheck TSH and adjust Synthroid as  indicated        Relevant Medications   levothyroxine (SYNTHROID) 50 MCG tablet   Other Relevant Orders   TSH     Other   Anxiety    Chronic and well-controlled Continue using Xanax very sparingly      Hypercholesteremia    Reviewed last lipid panel Not currently on a statin Recheck FLP and CMP Discussed diet and exercise       Relevant Orders   Lipid panel   Comprehensive metabolic panel   Pre-diabetes    Recommend low carb diet Recheck A1c       Relevant Orders   Hemoglobin A1c   HSV (herpes simplex virus) infection    New diagnosis Patient has history of oral herpes Her vulvar exam is consistent with HSV today We discussed that though this is her initial genital outbreak, it does not mean that this is a recent infection Discussed HSV-1 versus HSV-2 No intact vesicle capable of being swabbed and sent for testing today Treat with Valtrex      Relevant Medications   valACYclovir (VALTREX) 1000 MG tablet     Return in about 6 months (around 02/13/2023) for AWV, CPE.      I, Lavon Paganini, MD, have  reviewed all documentation for this visit. The documentation on 08/13/22 for the exam, diagnosis, procedures, and orders are all accurate and complete.   Yamna Mackel, Dionne Bucy, MD, MPH [facility]

## 2022-08-13 NOTE — Assessment & Plan Note (Signed)
Reviewed last lipid panel Not currently on a statin Recheck FLP and CMP Discussed diet and exercise  

## 2022-08-13 NOTE — Assessment & Plan Note (Signed)
Previously well controlled Continue Synthroid at current dose  Recheck TSH and adjust Synthroid as indicated   

## 2022-08-13 NOTE — Assessment & Plan Note (Signed)
New diagnosis Patient has history of oral herpes Her vulvar exam is consistent with HSV today We discussed that though this is her initial genital outbreak, it does not mean that this is a recent infection Discussed HSV-1 versus HSV-2 No intact vesicle capable of being swabbed and sent for testing today Treat with Valtrex

## 2022-08-14 LAB — HEMOGLOBIN A1C
Est. average glucose Bld gHb Est-mCnc: 117 mg/dL
Hgb A1c MFr Bld: 5.7 % — ABNORMAL HIGH (ref 4.8–5.6)

## 2022-08-14 LAB — COMPREHENSIVE METABOLIC PANEL
ALT: 12 IU/L (ref 0–32)
AST: 22 IU/L (ref 0–40)
Albumin/Globulin Ratio: 1.9 (ref 1.2–2.2)
Albumin: 4.3 g/dL (ref 3.8–4.8)
Alkaline Phosphatase: 108 IU/L (ref 44–121)
BUN/Creatinine Ratio: 15 (ref 12–28)
BUN: 14 mg/dL (ref 8–27)
Bilirubin Total: 0.3 mg/dL (ref 0.0–1.2)
CO2: 25 mmol/L (ref 20–29)
Calcium: 10 mg/dL (ref 8.7–10.3)
Chloride: 101 mmol/L (ref 96–106)
Creatinine, Ser: 0.96 mg/dL (ref 0.57–1.00)
Globulin, Total: 2.3 g/dL (ref 1.5–4.5)
Glucose: 79 mg/dL (ref 70–99)
Potassium: 4.2 mmol/L (ref 3.5–5.2)
Sodium: 143 mmol/L (ref 134–144)
Total Protein: 6.6 g/dL (ref 6.0–8.5)
eGFR: 61 mL/min/{1.73_m2} (ref >59)

## 2022-08-14 LAB — LIPID PANEL
Chol/HDL Ratio: 3.5 ratio (ref 0.0–4.4)
Cholesterol, Total: 255 mg/dL — ABNORMAL HIGH (ref 100–199)
HDL: 72 mg/dL (ref >39)
LDL Chol Calc (NIH): 149 mg/dL — ABNORMAL HIGH (ref 0–99)
Triglycerides: 192 mg/dL — ABNORMAL HIGH (ref 0–149)
VLDL Cholesterol Cal: 34 mg/dL (ref 5–40)

## 2022-08-14 LAB — TSH: TSH: 2.1 u[IU]/mL (ref 0.450–4.500)

## 2022-10-14 ENCOUNTER — Ambulatory Visit: Payer: Medicare HMO | Admitting: Urology

## 2022-11-25 ENCOUNTER — Other Ambulatory Visit: Payer: Self-pay | Admitting: Family Medicine

## 2022-12-08 DIAGNOSIS — M1711 Unilateral primary osteoarthritis, right knee: Secondary | ICD-10-CM | POA: Diagnosis not present

## 2022-12-08 DIAGNOSIS — M25562 Pain in left knee: Secondary | ICD-10-CM | POA: Diagnosis not present

## 2023-01-05 ENCOUNTER — Other Ambulatory Visit: Payer: Self-pay | Admitting: Family Medicine

## 2023-01-05 DIAGNOSIS — Z1231 Encounter for screening mammogram for malignant neoplasm of breast: Secondary | ICD-10-CM

## 2023-01-06 ENCOUNTER — Encounter: Payer: Self-pay | Admitting: Family Medicine

## 2023-01-06 DIAGNOSIS — R7303 Prediabetes: Secondary | ICD-10-CM

## 2023-01-06 DIAGNOSIS — E890 Postprocedural hypothyroidism: Secondary | ICD-10-CM

## 2023-01-06 DIAGNOSIS — E7800 Pure hypercholesterolemia, unspecified: Secondary | ICD-10-CM

## 2023-01-08 NOTE — Telephone Encounter (Signed)
Ok to order TSH, A1c, lipids, CMP - use diagnoses hypothyroid, prediabetes, hyperlipidemia from her problem list. Recommend that she get them at least 2 days before her appt with me. Thanks

## 2023-01-11 DIAGNOSIS — H524 Presbyopia: Secondary | ICD-10-CM | POA: Diagnosis not present

## 2023-01-12 DIAGNOSIS — M25562 Pain in left knee: Secondary | ICD-10-CM | POA: Diagnosis not present

## 2023-01-12 DIAGNOSIS — M1712 Unilateral primary osteoarthritis, left knee: Secondary | ICD-10-CM | POA: Diagnosis not present

## 2023-01-19 ENCOUNTER — Encounter: Payer: Self-pay | Admitting: Family Medicine

## 2023-01-19 MED ORDER — GABAPENTIN 300 MG PO CAPS: 300.0000 mg | ORAL_CAPSULE | Freq: Every day | ORAL | 2 refills | Status: DC

## 2023-01-19 NOTE — Telephone Encounter (Signed)
Sent prescription refill to [facility] pharmacy called and left message on voicemail. With approved order for early refill.

## 2023-01-19 NOTE — Telephone Encounter (Signed)
Yes. Please approve early refills for this one instance.

## 2023-03-24 ENCOUNTER — Ambulatory Visit
Admission: RE | Admit: 2023-03-24 | Discharge: 2023-03-24 | Disposition: A | Discharge status: Home / Self Care | Payer: Medicare HMO | Source: Ambulatory Visit | Attending: Family Medicine | Admitting: Family Medicine

## 2023-03-24 DIAGNOSIS — Z1231 Encounter for screening mammogram for malignant neoplasm of breast: Secondary | ICD-10-CM | POA: Insufficient documentation

## 2023-03-26 DIAGNOSIS — E7800 Pure hypercholesterolemia, unspecified: Secondary | ICD-10-CM | POA: Diagnosis not present

## 2023-03-26 DIAGNOSIS — R7303 Prediabetes: Secondary | ICD-10-CM | POA: Diagnosis not present

## 2023-03-26 DIAGNOSIS — E890 Postprocedural hypothyroidism: Secondary | ICD-10-CM | POA: Diagnosis not present

## 2023-03-27 LAB — COMPREHENSIVE METABOLIC PANEL
ALT: 10 [IU]/L (ref 0–32)
AST: 26 [IU]/L (ref 0–40)
Albumin: 4.2 g/dL (ref 3.8–4.8)
Alkaline Phosphatase: 100 [IU]/L (ref 44–121)
BUN/Creatinine Ratio: 21 (ref 12–28)
BUN: 19 mg/dL (ref 8–27)
Bilirubin Total: 0.4 mg/dL (ref 0.0–1.2)
CO2: 25 mmol/L (ref 20–29)
Calcium: 9.6 mg/dL (ref 8.7–10.3)
Chloride: 105 mmol/L (ref 96–106)
Creatinine, Ser: 0.91 mg/dL (ref 0.57–1.00)
Globulin, Total: 2.1 g/dL (ref 1.5–4.5)
Glucose: 91 mg/dL (ref 70–99)
Potassium: 4.4 mmol/L (ref 3.5–5.2)
Sodium: 143 mmol/L (ref 134–144)
Total Protein: 6.3 g/dL (ref 6.0–8.5)
eGFR: 65 mL/min/{1.73_m2} (ref >59)

## 2023-03-27 LAB — LIPID PANEL WITH LDL/HDL RATIO
Cholesterol, Total: 232 mg/dL — ABNORMAL HIGH (ref 100–199)
HDL: 66 mg/dL (ref >39)
LDL Chol Calc (NIH): 151 mg/dL — ABNORMAL HIGH (ref 0–99)
LDL/HDL Ratio: 2.3 {ratio} (ref 0.0–3.2)
Triglycerides: 88 mg/dL (ref 0–149)
VLDL Cholesterol Cal: 15 mg/dL (ref 5–40)

## 2023-03-27 LAB — TSH: TSH: 2.25 u[IU]/mL (ref 0.450–4.500)

## 2023-03-27 LAB — HEMOGLOBIN A1C
Est. average glucose Bld gHb Est-mCnc: 117 mg/dL
Hgb A1c MFr Bld: 5.7 % — ABNORMAL HIGH (ref 4.8–5.6)

## 2023-03-29 ENCOUNTER — Encounter: Payer: PPO | Admitting: Family Medicine

## 2023-03-29 NOTE — Progress Notes (Unsigned)
Annual Wellness Visit     Patient: Sonya Barnes, Female    DOB: 30-Jun-1945, 77 y.o.   MRN: [id number] Visit Date: 03/30/2023  Today's Provider: Ronnald Ramp, MD   No chief complaint on file.  Subjective    Sonya Barnes is a 77 y.o. female who presents today for her Annual Wellness Visit.  She reports consuming a {diet types:17450} diet.  {Exercise:19826} She generally feels {well/fairly well/poorly:18703}.  She reports sleeping {well/fairly well/poorly:18703}.    She {does/does not:200015} have additional problems to discuss today.    Medications: Outpatient Medications Prior to Visit  Medication Sig   ALPRAZolam (XANAX) 0.5 MG tablet Take 0.5-1 tablets (0.25-0.5 mg total) by mouth 2 (two) times daily as needed.   Calcium-Vitamin D-Vitamin K (VIACTIV PO) Take by mouth daily.   clobetasol ointment (TEMOVATE) 0.05 % Apply 1 application. topically daily. Apply to urethra   estradiol (ESTRACE) 0.1 MG/GM vaginal cream Estrogen Cream Instruction  Discard applicator  Apply pea sized amount to tip of finger to urethra before bed. Wash hands well after application. Use Monday, Wednesday and Friday   famotidine (PEPCID) 20 MG tablet Take 20 mg by mouth 2 (two) times daily.   gabapentin (NEURONTIN) 300 MG capsule Take 1 capsule (300 mg total) by mouth at bedtime.   levothyroxine (SYNTHROID) 50 MCG tablet Take 1 tablet (50 mcg total) by mouth daily.   loratadine (CLARITIN) 10 MG tablet Take 10 mg by mouth daily.   Multiple Vitamin (MULTIVITAMIN) tablet Take 1 tablet by mouth daily.   omeprazole (PRILOSEC) 40 MG capsule TAKE 1 CAPSULE (40 MG TOTAL) BY MOUTH DAILY.   Polyethyl Glycol-Propyl Glycol (SYSTANE ULTRA OP) Apply to eye.   No facility-administered medications prior to visit.    No Known Allergies  Patient Care Team: Erasmo Downer, MD as PCP - General (Family Medicine)  Review of Systems  {Insert previous labs (optional):23779} {See past labs   Heme  Chem  Endocrine  Serology  Results Review (optional):1}    Objective    Vitals: There were no vitals taken for this visit. {Insert last BP/Wt (optional):23777}{See vitals history (optional):1}    Physical Exam ***  Most recent functional status assessment:    08/13/2022   11:19 AM  In your present state of health, do you have any difficulty performing the following activities:  Hearing? 0  Vision? 0  Difficulty concentrating or making decisions? 0  Walking or climbing stairs? 0  Dressing or bathing? 0  Doing errands, shopping? 0   Most recent fall risk assessment:    08/13/2022   11:19 AM  Fall Risk   Falls in the past year? 0  Number falls in past yr: 0  Injury with Fall? 0  Risk for fall due to : No Fall Risks  Follow up Falls evaluation completed    Most recent depression screenings:    08/13/2022   11:19 AM 02/04/2022    2:27 PM  PHQ 2/9 Scores  PHQ - 2 Score 2 2  PHQ- 9 Score 4 6   Most recent cognitive screening:    02/23/2019   10:11 AM  6CIT Screen  What Year? 0 points  What month? 0 points  What time? 0 points  Count back from 20 0 points  Months in reverse 0 points  Repeat phrase 0 points  Total Score 0 points   Most recent Audit-C alcohol use screening    03/26/2023    7:16 PM  Alcohol  Use Disorder Test (AUDIT)  1. How often do you have a drink containing alcohol? 0  3. How often do you have six or more drinks on one occasion? 0   A score of 3 or more in women, and 4 or more in men indicates increased risk for alcohol abuse, EXCEPT if all of the points are from question 1       02/01/2017    9:20 AM  Advanced Directives  Does Patient Have a Medical Advance Directive? Yes  Type of Estate agent of [place];Living will  Does patient want to make changes to medical advance directive? No - Patient declined  Copy of Healthcare Power of Attorney in Chart? No - copy requested      No results found for any  visits on 03/30/23.  Assessment & Plan     Problem List Items Addressed This Visit   None Visit Diagnoses     Encounter for annual wellness visit (AWV) in Medicare patient    -  Primary   Annual physical exam            Immunization History  Administered Date(s) Administered   DTaP 05/18/1996   Fluad Quad(high Dose 65+) 02/23/2019, 02/06/2020, 02/22/2021, 02/02/2022   Influenza, High Dose Seasonal PF 02/27/2016, 02/11/2017, 01/21/2018   Influenza-Unspecified 01/17/2015   PFIZER(Purple Top)SARS-COV-2 Vaccination 06/23/2019, 07/17/2019   Pfizer Covid-19 Vaccine Bivalent Booster 86yrs & up 04/22/2020, 12/03/2020   Pfizer(Comirnaty)Fall Seasonal Vaccine 12 years and older 03/25/2022   Pneumococcal Conjugate-13 01/26/2014   Pneumococcal Polysaccharide-23 02/15/2015   Td 02/23/2019   Zoster Recombinant(Shingrix) 11/14/2020, 01/30/2021   Zoster, Live 12/24/2010    Health Maintenance  Topic Date Due   Colonoscopy  02/01/2022   INFLUENZA VACCINE  12/17/2022   COVID-19 Vaccine (6 - 2023-24 season) 01/17/2023   Medicare Annual Wellness (AWV)  03/29/2024   DTaP/Tdap/Td (3 - Tdap) 02/22/2029   Pneumonia Vaccine 34+ Years old  Completed   DEXA SCAN  Completed   Hepatitis C Screening  Completed   Zoster Vaccines- Shingrix  Completed   HPV VACCINES  Aged Out     No follow-ups on file.       Ronnald Ramp, MD  [facility] [phone] (phone) [phone] (fax)  [facility]

## 2023-03-30 ENCOUNTER — Encounter: Payer: Self-pay | Admitting: Family Medicine

## 2023-03-30 ENCOUNTER — Ambulatory Visit: Payer: PPO | Admitting: Family Medicine

## 2023-03-30 VITALS — BP 110/58 | HR 52 | Resp 16 | Ht 64.0 in | Wt 128.2 lb

## 2023-03-30 DIAGNOSIS — Z0000 Encounter for general adult medical examination without abnormal findings: Secondary | ICD-10-CM

## 2023-03-30 DIAGNOSIS — E890 Postprocedural hypothyroidism: Secondary | ICD-10-CM

## 2023-03-30 DIAGNOSIS — R7303 Prediabetes: Secondary | ICD-10-CM

## 2023-03-30 DIAGNOSIS — E7800 Pure hypercholesterolemia, unspecified: Secondary | ICD-10-CM

## 2023-03-30 NOTE — Assessment & Plan Note (Signed)
ASCVD 15  PT would like to start statin therapy after she has reviewed information  Given AVS with info on HLD and crestor  Pt will call clinic to start crestor 5mg  dialy

## 2023-03-30 NOTE — Patient Instructions (Signed)
Rosuvastatin Tablets What is this medication? ROSUVASTATIN (roe SOO va sta tin) treats high cholesterol and reduces the risk of heart attack and stroke. It works by decreasing bad cholesterol and fats (such as LDL, triglycerides) and increasing good cholesterol (HDL) in your blood. It belongs to a group of medications called statins. Changes to diet and exercise are often combined with this medication. This medicine may be used for other purposes; ask your health care provider or pharmacist if you have questions. COMMON BRAND NAME(S): Crestor What should I tell my care team before I take this medication? They need to know if you have any of these conditions: Diabetes Frequently drink alcohol Kidney disease Liver disease Muscle cramps, pain Stroke Thyroid disease An unusual or allergic reaction to rosuvastatin, other medications, foods, dyes, or preservatives Pregnant or trying to get pregnant Breastfeeding How should I use this medication? Take this medication by mouth with a glass of water. Follow the directions on the prescription label. You can take it with or without food. If it upsets your stomach, take it with food. Do not cut, crush or chew this medication. Swallow the tablets whole. Take your medication at regular intervals. Do not take it more often than directed. Take antacids that have a combination of aluminum and magnesium hydroxide in them at a different time of day than this medication. Take these products 2 hours AFTER this medication. Talk to your care team about the use of this medication in children. While this medication may be prescribed for children as young as 7 for selected conditions, precautions do apply. Overdosage: If you think you have taken too much of this medicine contact a poison control center or emergency room at once. NOTE: This medicine is only for you. Do not share this medicine with others. What if I miss a dose? If you miss a dose, take it as soon as  you can. If your next dose is to be taken in less than 12 hours, then do not take the missed dose. Take the next dose at your regular time. Do not take double or extra doses. What may interact with this medication? Do not take this medication with any of the following: Supplements like red yeast rice This medication may also interact with the following: Alcohol Antacids containing aluminum hydroxide and magnesium hydroxide Cyclosporine Other medications for high cholesterol Some medications for HIV infection Warfarin This list may not describe all possible interactions. Give your health care provider a list of all the medicines, herbs, non-prescription drugs, or dietary supplements you use. Also tell them if you smoke, drink alcohol, or use illegal drugs. Some items may interact with your medicine. What should I watch for while using this medication? Visit your care team for regular checks on your progress. Tell your care team if your symptoms do not start to get better or if they get worse. Your care team may tell you to stop taking this medication if you develop muscle problems. If your muscle problems do not go away after stopping this medication, contact your care team. Talk to your care team if you may be pregnant. Serious birth defects can occur if you take this medication during pregnancy. Talk to your care team before breastfeeding. Changes to your treatment plan may be needed. This medication may increase blood sugar. Ask your care team if changes in diet or medications are needed if you have diabetes. If you are going to need surgery or other procedure, tell your care team that you  are using this medication. Taking this medication is only part of a total heart healthy program. Ask your care team if there are other changes you can make to improve your overall health. What side effects may I notice from receiving this medication? Side effects that you should report to your care team as  soon as possible: Allergic reactions--skin rash, itching, hives, swelling of the face, lips, tongue, or throat High blood sugar (hyperglycemia)--increased thirst or amount of urine, unusual weakness, fatigue, blurry vision Liver injury--right upper belly pain, loss of appetite, nausea, light-colored stool, dark yellow or brown urine, yellowing skin or eyes, unusual weakness, fatigue Muscle injury--unusual weakness, fatigue, muscle pain, dark yellow or brown urine, decrease in amount of urine Redness, blistering, peeling or loosening of the skin, including inside the mouth Side effects that usually do not require medical attention (report to your care team if they continue or are bothersome): Fatigue Headache Nausea Stomach pain This list may not describe all possible side effects. Call your doctor for medical advice about side effects. You may report side effects to FDA at 1-800-FDA-1088. Where should I keep my medication? Keep out of the reach of children and pets. Store between 20 and 25 degrees C (68 and 77 degrees F). Get rid of any unused medication after the expiration date. To get rid of medications that are no longer needed or have expired: Take the medication to a medication take-back program. Check with your pharmacy or law enforcement to find a location. If you cannot return the medication, check the label or package insert to see if the medication should be thrown out in the garbage or flushed down the toilet. If you are not sure, ask your care team. If it is safe to put it in the trash, take the medication out of the container. Mix the medication with cat litter, dirt, coffee grounds, or other unwanted substance. Seal the mixture in a bag or container. Put it in the trash. NOTE: This sheet is a summary. It may not cover all possible information. If you have questions about this medicine, talk to your doctor, pharmacist, or health care provider.  2024 Elsevier/Gold Standard  (2021-10-03 00:00:00)

## 2023-04-15 ENCOUNTER — Other Ambulatory Visit: Payer: Self-pay | Admitting: Family Medicine

## 2023-04-15 DIAGNOSIS — K219 Gastro-esophageal reflux disease without esophagitis: Secondary | ICD-10-CM

## 2023-04-19 ENCOUNTER — Ambulatory Visit: Payer: Self-pay

## 2023-04-19 ENCOUNTER — Encounter: Payer: Self-pay | Admitting: Family Medicine

## 2023-04-19 ENCOUNTER — Ambulatory Visit (INDEPENDENT_AMBULATORY_CARE_PROVIDER_SITE_OTHER): Payer: Medicare HMO | Admitting: Family Medicine

## 2023-04-19 VITALS — BP 110/67 | HR 60 | Resp 16 | Ht 64.5 in | Wt 128.0 lb

## 2023-04-19 DIAGNOSIS — M810 Age-related osteoporosis without current pathological fracture: Secondary | ICD-10-CM

## 2023-04-19 DIAGNOSIS — E7800 Pure hypercholesterolemia, unspecified: Secondary | ICD-10-CM

## 2023-04-19 MED ORDER — ROSUVASTATIN CALCIUM 5 MG PO TABS: 5.0000 mg | ORAL_TABLET | Freq: Every day | ORAL | 3 refills | Status: DC

## 2023-04-19 NOTE — Patient Instructions (Signed)
CoQ10 100-200 mg daily with the statin

## 2023-04-19 NOTE — Telephone Encounter (Signed)
Chief Complaint: Medication Question   Disposition: [] ED /[] Urgent Care (no appt availability in office) / [] Appointment(In office/virtual)/ []  [facility]/ [x] Home Care/ [] Refused Recommended Disposition /[] [facility]/ []  Follow-up with PCP Additional Notes: Patient is calling to verify what dose if CoQ10 did she need to pick up from the pharmacy. Care advice was given per Dr. Penelope Coop note CoQ10 100-200 MG daily recommended with the statin. Patient verbalized understanding.  Reason for Disposition  General information question, no triage required and triager able to answer question  Answer Assessment - Initial Assessment Questions 1. REASON FOR CALL or QUESTION: "What is your reason for calling today?" or "How can I best help you?" or "What question do you have that I can help answer?"     What does of COQ10 does Dr. B want me to take?  Protocols used: Information Only Call - No Triage-A-AH

## 2023-04-19 NOTE — Assessment & Plan Note (Signed)
Due for a bone density scan. Last scan was in September 2022, and she has osteoporosis, requiring biennial scans.   - Order bone density scan for January or February

## 2023-04-19 NOTE — Progress Notes (Signed)
Established Patient Office Visit  Subjective   Patient ID: Sonya Barnes, female    DOB: 07/27/1945  Age: 77 y.o. MRN: [id number]  Chief Complaint  Patient presents with   Medical Management of Chronic Issues    Discuss cholesterol medication    HPI  Discussed the use of AI scribe software for clinical note transcription with the patient, who gave verbal consent to proceed.  History of Present Illness   The patient, with a family history of stroke and heart disease, presents with concerns about her cholesterol levels. She reports that her total cholesterol level has decreased from 255 to 232, but her HDL has also decreased and her LDL has increased. She is particularly concerned about her ASCVD risk score of 15.3, which she understands to be high. She has been researching cholesterol-lowering medications and is hesitant about starting statin therapy due to potential side effects, particularly muscle and joint pain. She has spoken to friends who have experienced these side effects and has also read about other medications such as Zetia. She is interested in taking CoQ10 to potentially mitigate any side effects from statin therapy. The patient is due for a bone density test, which she plans to schedule for early next year.         ROS    Objective:     BP 110/67 (BP Location: Left Arm, Patient Position: Sitting, Cuff Size: Normal)   Pulse 60   Resp 16   Ht 5' 4.5" (1.638 m)   Wt 128 lb (58.1 kg)   BMI 21.63 kg/m    Physical Exam Vitals reviewed.  Constitutional:      General: She is not in acute distress.    Appearance: Normal appearance. She is well-developed. She is not diaphoretic.  HENT:     Head: Normocephalic and atraumatic.  Eyes:     General: No scleral icterus.    Conjunctiva/sclera: Conjunctivae normal.  Neck:     Thyroid: No thyromegaly.  Cardiovascular:     Rate and Rhythm: Normal rate and regular rhythm.     Pulses: Normal pulses.     Heart sounds:  Normal heart sounds. No murmur heard. Pulmonary:     Effort: Pulmonary effort is normal. No respiratory distress.     Breath sounds: Normal breath sounds. No wheezing, rhonchi or rales.  Musculoskeletal:     Cervical back: Neck supple.     Right lower leg: No edema.     Left lower leg: No edema.  Lymphadenopathy:     Cervical: No cervical adenopathy.  Skin:    General: Skin is warm and dry.     Findings: No rash.  Neurological:     Mental Status: She is alert and oriented to person, place, and time. Mental status is at baseline.  Psychiatric:        Mood and Affect: Mood normal.        Behavior: Behavior normal.      No results found for any visits on 04/19/23.    The 10-year ASCVD risk score (Arnett DK, et al., 2019) is: 15.3%    Assessment & Plan:   Problem List Items Addressed This Visit       Musculoskeletal and Integument   Osteoporosis    Due for a bone density scan. Last scan was in September 2022, and she has osteoporosis, requiring biennial scans.   - Order bone density scan for January or February        Relevant Orders  DG Bone Density     Other   Hypercholesteremia - Primary    Total cholesterol decreased from 255 to 232, HDL decreased from 72 to 66, and LDL increased to 151. ASCVD risk is 15.3%, indicating high risk for cardiovascular events. Discussed statin therapy benefits and side effects, including muscle aches and the use of CoQ10. Patient prefers low-dose Crestor with CoQ10.   - Prescribe Crestor 5 mg daily at bedtime   - Recommend CoQ10 100-200 mg daily   - Recheck cholesterol levels before June appointment        Relevant Medications   rosuvastatin (CRESTOR) 5 MG tablet       Return in about 6 months (around 10/18/2023) for chronic disease f/u, as scheduled.    Shirlee Latch, MD

## 2023-04-19 NOTE — Assessment & Plan Note (Signed)
Total cholesterol decreased from 255 to 232, HDL decreased from 72 to 66, and LDL increased to 151. ASCVD risk is 15.3%, indicating high risk for cardiovascular events. Discussed statin therapy benefits and side effects, including muscle aches and the use of CoQ10. Patient prefers low-dose Crestor with CoQ10.   - Prescribe Crestor 5 mg daily at bedtime   - Recommend CoQ10 100-200 mg daily   - Recheck cholesterol levels before June appointment

## 2023-06-01 DIAGNOSIS — H26493 Other secondary cataract, bilateral: Secondary | ICD-10-CM | POA: Diagnosis not present

## 2023-06-01 DIAGNOSIS — H524 Presbyopia: Secondary | ICD-10-CM | POA: Diagnosis not present

## 2023-06-01 DIAGNOSIS — Z961 Presence of intraocular lens: Secondary | ICD-10-CM | POA: Diagnosis not present

## 2023-06-02 ENCOUNTER — Encounter: Payer: Self-pay | Admitting: Family Medicine

## 2023-06-02 DIAGNOSIS — F419 Anxiety disorder, unspecified: Secondary | ICD-10-CM

## 2023-06-08 ENCOUNTER — Other Ambulatory Visit: Payer: Self-pay

## 2023-06-08 MED ORDER — ALPRAZOLAM 0.5 MG PO TABS: 0.2500 mg | ORAL_TABLET | Freq: Two times a day (BID) | ORAL | 0 refills | Status: DC | PRN

## 2023-06-08 NOTE — Addendum Note (Signed)
Addended by: Erasmo Downer on: 06/08/2023 10:11 AM   Modules accepted: Orders

## 2023-06-08 NOTE — Telephone Encounter (Signed)
Per [facility] patient has not filled Alprazolam >1year in their system.  Please advise on request to refill. Thanks!

## 2023-06-08 NOTE — Telephone Encounter (Signed)
Pt states that message makes no sense at all.  It cannot be too soon.  This was last refilled in 2023.  She states Dr B aware pt likes to keep this on hand.  Please send to  [id number] [place], [place] - [address]

## 2023-06-15 DIAGNOSIS — H26491 Other secondary cataract, right eye: Secondary | ICD-10-CM | POA: Diagnosis not present

## 2023-07-07 ENCOUNTER — Other Ambulatory Visit: Payer: Medicare HMO

## 2023-07-08 ENCOUNTER — Other Ambulatory Visit: Payer: Medicare HMO

## 2023-07-13 ENCOUNTER — Ambulatory Visit
Admission: RE | Admit: 2023-07-13 | Discharge: 2023-07-13 | Disposition: A | Discharge status: Home / Self Care | Payer: PPO | Source: Ambulatory Visit | Attending: Family Medicine | Admitting: Family Medicine

## 2023-07-13 ENCOUNTER — Encounter: Payer: Self-pay | Admitting: Family Medicine

## 2023-07-13 DIAGNOSIS — M810 Age-related osteoporosis without current pathological fracture: Secondary | ICD-10-CM | POA: Diagnosis not present

## 2023-08-03 DIAGNOSIS — M1712 Unilateral primary osteoarthritis, left knee: Secondary | ICD-10-CM | POA: Diagnosis not present

## 2023-10-10 ENCOUNTER — Other Ambulatory Visit: Payer: Self-pay | Admitting: Family Medicine

## 2023-10-18 ENCOUNTER — Encounter: Payer: Self-pay | Admitting: Family Medicine

## 2023-10-18 DIAGNOSIS — R7303 Prediabetes: Secondary | ICD-10-CM

## 2023-10-18 DIAGNOSIS — E7800 Pure hypercholesterolemia, unspecified: Secondary | ICD-10-CM

## 2023-10-18 DIAGNOSIS — E890 Postprocedural hypothyroidism: Secondary | ICD-10-CM

## 2023-11-04 DIAGNOSIS — E7800 Pure hypercholesterolemia, unspecified: Secondary | ICD-10-CM | POA: Diagnosis not present

## 2023-11-04 DIAGNOSIS — R7303 Prediabetes: Secondary | ICD-10-CM | POA: Diagnosis not present

## 2023-11-04 DIAGNOSIS — E890 Postprocedural hypothyroidism: Secondary | ICD-10-CM | POA: Diagnosis not present

## 2023-11-05 ENCOUNTER — Ambulatory Visit: Payer: Self-pay | Admitting: Family Medicine

## 2023-11-05 LAB — COMPREHENSIVE METABOLIC PANEL WITH GFR
ALT: 14 IU/L (ref 0–32)
AST: 23 IU/L (ref 0–40)
Albumin: 4.1 g/dL (ref 3.8–4.8)
Alkaline Phosphatase: 90 IU/L (ref 44–121)
BUN/Creatinine Ratio: 16 (ref 12–28)
BUN: 15 mg/dL (ref 8–27)
Bilirubin Total: 0.4 mg/dL (ref 0.0–1.2)
CO2: 23 mmol/L (ref 20–29)
Calcium: 9.9 mg/dL (ref 8.7–10.3)
Chloride: 104 mmol/L (ref 96–106)
Creatinine, Ser: 0.92 mg/dL (ref 0.57–1.00)
Globulin, Total: 1.6 g/dL (ref 1.5–4.5)
Glucose: 93 mg/dL (ref 70–99)
Potassium: 4.5 mmol/L (ref 3.5–5.2)
Sodium: 142 mmol/L (ref 134–144)
Total Protein: 5.7 g/dL — ABNORMAL LOW (ref 6.0–8.5)
eGFR: 64 mL/min/{1.73_m2} (ref >59)

## 2023-11-05 LAB — TSH: TSH: 1.94 u[IU]/mL (ref 0.450–4.500)

## 2023-11-05 LAB — LIPID PANEL WITH LDL/HDL RATIO
Cholesterol, Total: 169 mg/dL (ref 100–199)
HDL: 72 mg/dL (ref >39)
LDL Chol Calc (NIH): 82 mg/dL (ref 0–99)
LDL/HDL Ratio: 1.1 ratio (ref 0.0–3.2)
Triglycerides: 80 mg/dL (ref 0–149)
VLDL Cholesterol Cal: 15 mg/dL (ref 5–40)

## 2023-11-05 LAB — HEMOGLOBIN A1C
Est. average glucose Bld gHb Est-mCnc: 114 mg/dL
Hgb A1c MFr Bld: 5.6 % (ref 4.8–5.6)

## 2023-11-07 ENCOUNTER — Other Ambulatory Visit: Payer: Self-pay | Admitting: Family Medicine

## 2023-11-08 ENCOUNTER — Ambulatory Visit: Attending: Family Medicine

## 2023-11-08 ENCOUNTER — Encounter: Payer: Self-pay | Admitting: Family Medicine

## 2023-11-08 ENCOUNTER — Ambulatory Visit (INDEPENDENT_AMBULATORY_CARE_PROVIDER_SITE_OTHER): Payer: Self-pay | Admitting: Family Medicine

## 2023-11-08 VITALS — BP 125/67 | HR 69 | Ht 64.0 in | Wt 125.0 lb

## 2023-11-08 DIAGNOSIS — E890 Postprocedural hypothyroidism: Secondary | ICD-10-CM

## 2023-11-08 DIAGNOSIS — R7303 Prediabetes: Secondary | ICD-10-CM

## 2023-11-08 DIAGNOSIS — R002 Palpitations: Secondary | ICD-10-CM | POA: Insufficient documentation

## 2023-11-08 DIAGNOSIS — E7800 Pure hypercholesterolemia, unspecified: Secondary | ICD-10-CM

## 2023-11-08 DIAGNOSIS — F419 Anxiety disorder, unspecified: Secondary | ICD-10-CM

## 2023-11-08 MED ORDER — LEVOTHYROXINE SODIUM 50 MCG PO TABS: 50.0000 ug | ORAL_TABLET | Freq: Every day | ORAL | 3 refills | Status: AC

## 2023-11-08 MED ORDER — ALPRAZOLAM 0.5 MG PO TABS: 0.2500 mg | ORAL_TABLET | Freq: Two times a day (BID) | ORAL | 0 refills | Status: DC | PRN

## 2023-11-08 NOTE — Assessment & Plan Note (Signed)
 Intermittent palpitations since March, characterized by rapid heartbeat lasting up to an hour, accompanied by belching and near-syncope. Family history of atrial fibrillation. Differential diagnosis includes atrial fibrillation and other arrhythmias. Anxiety may contribute to symptoms. Discussed the use of a 14-day Zio patch heart monitor, a waterproof, wire-free device that captures heart activity over two weeks, enhancing arrhythmia detection compared to traditional short-term monitors. - Order EKG in office - Order 14-day Zio patch heart monitor - Order CBC to rule out anemia - Refer to electrophysiologist if heart monitor indicates arrhythmia

## 2023-11-08 NOTE — Progress Notes (Signed)
 Established patient visit   Patient: Sonya Barnes   DOB: 01-07-1946   78 y.o. Female  MRN: [id number] Visit Date: 11/08/2023  Today's healthcare provider: Jon Eva, MD   Chief Complaint  Patient presents with   Hyperlipidemia   Subjective    Hyperlipidemia     Discussed the use of AI scribe software for clinical note transcription with the patient, who gave verbal consent to proceed.  History of Present Illness   Sonya Barnes is a 78 year old female with anxiety who presents with palpitations and anxiety exacerbation.  Since February, she experiences significant anxiety due to life stressors, including family issues and neighborhood conflicts. Her anxiety is severe and increasingly difficult to manage. She uses alprazolam  as needed, taking half or a quarter tablet due to its sedative effects.  Episodes of rapid heartbeat began in March, initially lasting about 30 minutes, accompanied by belching but no shortness of breath. These episodes recurred every three weeks, with one lasting almost an hour in April, during which she felt faint, experienced blackouts, weakness, dizziness, and shakiness for 3-4 hours, followed by a feeling of being 'washed out.' Her blood sugar was 108 mg/dL, and blood pressure was 163/92 mmHg during this episode. Occasional 'flip flops' have occurred since, but no further fainting or prolonged palpitations.  There is a family history of atrial fibrillation; her mother and sister had similar symptoms. Her mother underwent cardioversion, and her sister had an ablation.  She has been on Crestor  5 mg daily since December, which lowered her cholesterol levels. She feels more tired than usual and experiences some memory issues, attributing these to the medication. She takes CoQ10 to mitigate side effects.  She has been estranged from her children since 2018, which is a significant emotional burden. She has good and bad days regarding this situation and  has been the 'strong one' in her family, especially for her husband.  She has reduced watching political programs on TV, which she believes contributed to her anxiety, noticing some improvement. Her A1c is 5.6%, and thyroid  function is normal. She is concerned about a low total protein level of 5.7 g/dL, but kidney and liver functions are normal.         Medications: Outpatient Medications Prior to Visit  Medication Sig   Calcium -Vitamin D -Vitamin K (VIACTIV PO) Take by mouth daily.   Cholecalciferol (VITAMIN D -1000 MAX ST) 25 MCG (1000 UT) tablet Take 1 tablet by mouth daily.   Coenzyme Q10 100 MG capsule Take 100 mg by mouth.   famotidine (PEPCID) 20 MG tablet Take 20 mg by mouth 2 (two) times daily.   gabapentin  (NEURONTIN ) 300 MG capsule TAKE 1 CAPSULE BY MOUTH NIGHTLY   loratadine (CLARITIN) 10 MG tablet Take 10 mg by mouth daily.   meloxicam (MOBIC) 7.5 MG tablet Take 7.5 mg by mouth daily.   Multiple Vitamin (MULTIVITAMIN) tablet Take 1 tablet by mouth daily.   omeprazole  (PRILOSEC) 40 MG capsule TAKE 1 CAPSULE (40 MG TOTAL) BY MOUTH DAILY.   Polyethyl Glycol-Propyl Glycol (SYSTANE ULTRA OP) Apply to eye.   rosuvastatin  (CRESTOR ) 5 MG tablet Take 1 tablet (5 mg total) by mouth daily.   [DISCONTINUED] ALPRAZolam  (XANAX ) 0.5 MG tablet Take 0.5-1 tablets (0.25-0.5 mg total) by mouth 2 (two) times daily as needed.   [DISCONTINUED] levothyroxine  (SYNTHROID ) 50 MCG tablet TAKE 1 TABLET BY MOUTH EVERY DAY   No facility-administered medications prior to visit.    Review of Systems  Objective    BP 125/67   Pulse 69   Ht 5' 4 (1.626 m)   Wt 125 lb (56.7 kg)   SpO2 100%   BMI 21.46 kg/m    Physical Exam Vitals reviewed.  Constitutional:      General: She is not in acute distress.    Appearance: Normal appearance. She is well-developed. She is not diaphoretic.  HENT:     Head: Normocephalic and atraumatic.   Eyes:     General: No scleral icterus.     Conjunctiva/sclera: Conjunctivae normal.   Neck:     Thyroid : No thyromegaly.   Cardiovascular:     Rate and Rhythm: Normal rate and regular rhythm.     Heart sounds: Normal heart sounds. No murmur heard. Pulmonary:     Effort: Pulmonary effort is normal. No respiratory distress.     Breath sounds: Normal breath sounds. No wheezing, rhonchi or rales.   Musculoskeletal:     Cervical back: Neck supple.     Right lower leg: No edema.     Left lower leg: No edema.  Lymphadenopathy:     Cervical: No cervical adenopathy.   Skin:    General: Skin is warm and dry.     Findings: No rash.   Neurological:     Mental Status: She is alert and oriented to person, place, and time. Mental status is at baseline.   Psychiatric:        Mood and Affect: Mood is anxious. Affect is tearful.        Behavior: Behavior normal.      EKG: NSR with incomplete RBBB, unchanged from 2011  No results found for any visits on 11/08/23.  Assessment & Plan     Problem List Items Addressed This Visit       Endocrine   Hypothyroidism associated with surgical procedure - Primary   Previously well controlled Continue Synthroid  at current dose       Relevant Medications   levothyroxine  (SYNTHROID ) 50 MCG tablet     Other   Anxiety   Increased anxiety since February, exacerbated by life stressors and family issues. Previous use of SSRIs (Zoloft , Lexapro ) with limited success. Prefers non-pharmacological interventions and uses alprazolam  sparingly due to side effects. Discussed therapy as a potential option to manage anxiety, emphasizing the benefit of having an impartial person to talk to. - Discuss therapy options, including [facility] and [facility] - Recommend 'Three Good Things' exercise for daily reflection - Encourage reduction of stressors, such as limiting exposure to distressing news      Relevant Medications   ALPRAZolam  (XANAX ) 0.5 MG tablet   Hypercholesteremia    Hyperlipidemia well-controlled with Crestor  5 mg daily. Significant improvement in LDL and total cholesterol levels since December. No significant side effects reported, though she notes increased fatigue and minor memory issues. Discussed the potential cognitive benefits of statins in preventing strokes and dementia, outweighing minor memory concerns. - Continue Crestor  5 mg daily - Monitor for side effects and efficacy       Pre-diabetes   Recent A1c back in normal range Continue to encourage low carb diet      Palpitations   Intermittent palpitations since March, characterized by rapid heartbeat lasting up to an hour, accompanied by belching and near-syncope. Family history of atrial fibrillation. Differential diagnosis includes atrial fibrillation and other arrhythmias. Anxiety may contribute to symptoms. Discussed the use of a 14-day Zio patch heart monitor, a waterproof, wire-free device that captures  heart activity over two weeks, enhancing arrhythmia detection compared to traditional short-term monitors. - Order EKG in office - Order 14-day Zio patch heart monitor - Order CBC to rule out anemia - Refer to electrophysiologist if heart monitor indicates arrhythmia      Relevant Orders   EKG 12-Lead   LONG TERM MONITOR (3-14 DAYS)   CBC    Return in about 6 months (around 05/09/2024) for CPE.      I personally spent a total of 50 minutes in the care of the patient today including preparing to see the patient, getting/reviewing separately obtained history, performing a medically appropriate exam/evaluation, counseling and educating, placing orders, documenting clinical information in the EHR, independently interpreting results, communicating results, and coordinating care.    Jon Eva, MD  [facility] [phone] (phone) [phone] (fax)  [facility]

## 2023-11-08 NOTE — Assessment & Plan Note (Signed)
 Hyperlipidemia well-controlled with Crestor  5 mg daily. Significant improvement in LDL and total cholesterol levels since December. No significant side effects reported, though she notes increased fatigue and minor memory issues. Discussed the potential cognitive benefits of statins in preventing strokes and dementia, outweighing minor memory concerns. - Continue Crestor  5 mg daily - Monitor for side effects and efficacy

## 2023-11-08 NOTE — Assessment & Plan Note (Signed)
 Previously well controlled Continue Synthroid at current dose

## 2023-11-08 NOTE — Assessment & Plan Note (Signed)
 Increased anxiety since February, exacerbated by life stressors and family issues. Previous use of SSRIs (Zoloft , Lexapro ) with limited success. Prefers non-pharmacological interventions and uses alprazolam  sparingly due to side effects. Discussed therapy as a potential option to manage anxiety, emphasizing the benefit of having an impartial person to talk to. - Discuss therapy options, including [facility] and [facility] - Recommend 'Three Good Things' exercise for daily reflection - Encourage reduction of stressors, such as limiting exposure to distressing news

## 2023-11-08 NOTE — Assessment & Plan Note (Signed)
 Recent A1c back in normal range Continue to encourage low carb diet

## 2023-11-09 ENCOUNTER — Ambulatory Visit: Payer: Self-pay | Admitting: Family Medicine

## 2023-11-09 LAB — CBC
Hematocrit: 41.1 % (ref 34.0–46.6)
Hemoglobin: 13.6 g/dL (ref 11.1–15.9)
MCH: 31.5 pg (ref 26.6–33.0)
MCHC: 33.1 g/dL (ref 31.5–35.7)
MCV: 95 fL (ref 79–97)
Platelets: 280 10*3/uL (ref 150–450)
RBC: 4.32 x10E6/uL (ref 3.77–5.28)
RDW: 12.6 % (ref 11.7–15.4)
WBC: 7.4 10*3/uL (ref 3.4–10.8)

## 2023-12-14 DIAGNOSIS — R002 Palpitations: Secondary | ICD-10-CM | POA: Diagnosis not present

## 2023-12-20 DIAGNOSIS — R002 Palpitations: Secondary | ICD-10-CM

## 2024-01-03 ENCOUNTER — Other Ambulatory Visit: Payer: Self-pay | Admitting: Family Medicine

## 2024-01-03 DIAGNOSIS — F419 Anxiety disorder, unspecified: Secondary | ICD-10-CM

## 2024-02-05 ENCOUNTER — Encounter: Payer: Self-pay | Admitting: Family Medicine

## 2024-02-06 DIAGNOSIS — I471 Supraventricular tachycardia, unspecified: Secondary | ICD-10-CM | POA: Insufficient documentation

## 2024-02-06 NOTE — Progress Notes (Unsigned)
 Cardiology Office Note  Date:  02/07/2024   ID:  Sonya Barnes, DOB [birth year], MRN [id number]  PCP:  Myrla Jon HERO, MD   Chief Complaint  Patient presents with   New Patient (Initial Visit)    Ref by Dr. Bacigalupo for symptomatic SVT; patient wore a Zio monitor for 14 days.  Patient c/o palpitations, lightheaded at times and racing heart beats.     HPI:  Sonya Barnes a 78 y.o. femalewith past medical history of: Hypothyroidism Anxiety, life stressors Hyperlipidemia Prediabetes Who presents by referral from Dr. Bacigalupo for SVT  Reported having palpitations on meeting with primary care March 2025 Reports having several severe episodes March, April with near syncope  Bad one  Details unclear, no heart rate or blood pressures available  Event monitor December 16, 2023 HR 39 to 203, average 65. 163 SVT episodes, longest 15 seconds.  Rare supraventricular and ventricular ectopy. No sustained arrhythmias. No atrial fibrillation.   Symptom trigger episodes correspond to nonsustained SVT.   No prior echocardiogram available  Walks for exercise Power walker Back in May, after exercising was sitting, had sx of Near syncope episode Seen by fire dept later, Bp and heart rate was normal  Family hx: Father with bradycardia: pacer, ICD Sister: with A-fib  EKG personally reviewed by myself on todays visit EKG Interpretation Date/Time:  Monday February 07 2024 10:26:49 EDT Ventricular Rate:  51 PR Interval:  142 QRS Duration:  116 QT Interval:  448 QTC Calculation: 412 R Axis:   -73  Text Interpretation: Sinus bradycardia Pulmonary disease pattern Right bundle branch block Left anterior fascicular block Bifascicular block When compared with ECG of 28-Jun-2009 17:01, No significant change was found Confirmed by Perla Lye [id number]) on 02/07/2024 10:40:31 AM    PMH:   has a past medical history of Allergy, Anxiety, Arthritis, Bundle branch block, right,  Degenerative disc disease, cervical, Depression, GERD (gastroesophageal reflux disease), Hypothyroidism, Kidney stone, Osteoporosis, Pre-diabetes, and Tendonitis of elbow, right.  PSH:    Past Surgical History:  Procedure Laterality Date   BREAST BIOPSY Left 15 years ago   Core BX of calcs. Negtive   BREAST CYST ASPIRATION Right 15 years ago   Negative   COLONOSCOPY N/A 02/01/2017   Procedure: COLONOSCOPY;  Surgeon: Jinny Carmine, MD;  Location: [facility];  Service: Gastroenterology;  Laterality: N/A;   DILATION AND CURETTAGE OF UTERUS  1956-57   ESOPHAGEAL DILATION N/A 02/01/2017   Procedure: ESOPHAGEAL DILATION;  Surgeon: Jinny Carmine, MD;  Location: [facility];  Service: Gastroenterology;  Laterality: N/A;   ESOPHAGOGASTRODUODENOSCOPY N/A 02/01/2017   Procedure: ESOPHAGOGASTRODUODENOSCOPY (EGD);  Surgeon: Jinny Carmine, MD;  Location: [facility];  Service: Gastroenterology;  Laterality: N/A;   EYE SURGERY  February 2024   Cataract surgery   HAMMER TOE SURGERY Bilateral    OVARIAN CYST REMOVAL  1967   THYROIDECTOMY, PARTIAL     TONSILLECTOMY      Current Outpatient Medications  Medication Sig Dispense Refill   ALPRAZolam  (XANAX ) 0.5 MG tablet TAKE 0.5-1 TABLETS (0.25-0.5 MG TOTAL) BY MOUTH 2 (TWO) TIMES DAILY AS NEEDED. 30 tablet 0   Calcium -Vitamin D -Vitamin K (VIACTIV PO) Take by mouth daily.     Cholecalciferol (VITAMIN D -1000 MAX ST) 25 MCG (1000 UT) tablet Take 1 tablet by mouth daily.     Coenzyme Q10 100 MG capsule Take 100 mg by mouth.     famotidine (PEPCID) 20 MG tablet Take 20 mg by mouth 2 (two) times  daily.     gabapentin  (NEURONTIN ) 300 MG capsule TAKE 1 CAPSULE BY MOUTH NIGHTLY 90 capsule 1   levothyroxine  (SYNTHROID ) 50 MCG tablet Take 1 tablet (50 mcg total) by mouth daily. 90 tablet 3   loratadine (CLARITIN) 10 MG tablet Take 10 mg by mouth daily.     meloxicam (MOBIC) 7.5 MG tablet Take 7.5 mg by mouth daily.     Multiple Vitamin  (MULTIVITAMIN) tablet Take 1 tablet by mouth daily.     omeprazole  (PRILOSEC) 40 MG capsule TAKE 1 CAPSULE (40 MG TOTAL) BY MOUTH DAILY. 90 capsule 3   Polyethyl Glycol-Propyl Glycol (SYSTANE ULTRA OP) Apply to eye.     rosuvastatin  (CRESTOR ) 5 MG tablet Take 1 tablet (5 mg total) by mouth daily. 90 tablet 3   No current facility-administered medications for this visit.     Allergies:   Patient has no known allergies.   Social History:  The patient  reports that she has never smoked. She has never used smokeless tobacco. She reports that she does not drink alcohol and does not use drugs.   Family History:   family history includes Alcohol abuse in her brother, maternal grandfather, maternal uncle, mother, and sister; Anxiety disorder in her mother; Arthritis in her mother; Birth defects in her son; COPD in her mother and sister; Cancer in her mother; Colon polyps in her sister; Depression in her mother and sister; Diabetes in her father; Drug abuse in her brother; Early death in her brother; Hearing loss in her father and sister; Heart disease in her father and sister; Liver disease in her brother; Lung disease in her mother and sister; Mental illness in her brother; Osteoporosis in her mother and sister; Stroke in her father.    Review of Systems: Review of Systems  Constitutional: Negative.   HENT: Negative.    Respiratory: Negative.    Cardiovascular: Negative.   Gastrointestinal: Negative.   Musculoskeletal: Negative.   Neurological: Negative.   Psychiatric/Behavioral: Negative.    All other systems reviewed and are negative.   PHYSICAL EXAM: VS:  BP 110/60 (BP Location: Right Arm, Patient Position: Sitting, Cuff Size: Normal)   Pulse (!) 51   Ht 5' 4.5 (1.638 m)   Wt 126 lb 2 oz (57.2 kg)   SpO2 95%   BMI 21.31 kg/m  , BMI Body mass index is 21.31 kg/m. GEN: Well nourished, well developed, in no acute distress HEENT: normal Neck: no JVD, carotid bruits, or  masses Cardiac: RRR; no murmurs, rubs, or gallops,no edema  Respiratory:  clear to auscultation bilaterally, normal work of breathing GI: soft, nontender, nondistended, + BS MS: no deformity or atrophy Skin: warm and dry, no rash Neuro:  Strength and sensation are intact Psych: euthymic mood, full affect  Recent Labs: 11/04/2023: ALT 14; BUN 15; Creatinine, Ser 0.92; Potassium 4.5; Sodium 142; TSH 1.940 11/08/2023: Hemoglobin 13.6; Platelets 280    Lipid Panel Lab Results  Component Value Date   CHOL 169 11/04/2023   HDL 72 11/04/2023   LDLCALC 82 11/04/2023   TRIG 80 11/04/2023      Wt Readings from Last 3 Encounters:  02/07/24 126 lb 2 oz (57.2 kg)  11/08/23 125 lb (56.7 kg)  04/19/23 128 lb (58.1 kg)      ASSESSMENT AND PLAN:  Problem List Items Addressed This Visit       Cardiology Problems   SVT (supraventricular tachycardia) (HCC) - Primary   Relevant Orders   EKG 12-Lead (Completed)  Hypercholesteremia     Other   Anxiety   Pre-diabetes   Palpitations   Relevant Orders   EKG 12-Lead (Completed)   Paroxysmal SVT Reports having episodes of near syncope in March, May 2025, etiology unclear -Event monitor with 10+ episodes of short SVT daily, reports that she does have symptoms at times but may not appreciate every episode - Discussed various treatment options including referral to EP, medications for SVT including antiarrhythmics given her baseline bradycardia, or further monitoring with no medications - Echocardiogram ordered, - Currently as she is having minimal symptoms she prefers no new medications at this time - Potentially could consider adding antiarrhythmic medication or ablation if symptoms get worse - Unable to add metoprolol, calcium  channel blocker given baseline bradycardia, rate 51 on today's visit  Hyperlipidemia Numbers doing better on Crestor  5 daily but reports that she decrease this down to 5 every other day secondary to  fatigue -Discussed other medication options such as Zetia, bempedoic acid, she will read about these and let us  know    Signed, Velinda Lunger, M.D., Ph.D. [facility] [place], [place] [phone]

## 2024-02-07 ENCOUNTER — Encounter: Payer: Self-pay | Admitting: Cardiovascular Disease

## 2024-02-07 ENCOUNTER — Ambulatory Visit: Attending: Cardiovascular Disease | Admitting: Cardiovascular Disease

## 2024-02-07 VITALS — BP 110/60 | HR 51 | Ht 64.5 in | Wt 126.1 lb

## 2024-02-07 DIAGNOSIS — F419 Anxiety disorder, unspecified: Secondary | ICD-10-CM

## 2024-02-07 DIAGNOSIS — E7800 Pure hypercholesterolemia, unspecified: Secondary | ICD-10-CM

## 2024-02-07 DIAGNOSIS — I471 Supraventricular tachycardia, unspecified: Secondary | ICD-10-CM

## 2024-02-07 DIAGNOSIS — R7303 Prediabetes: Secondary | ICD-10-CM

## 2024-02-07 DIAGNOSIS — R002 Palpitations: Secondary | ICD-10-CM

## 2024-02-07 NOTE — Patient Instructions (Addendum)
 Read about zetia/ezetimibe for cholesterol (20% drop) Also read about bempidoic acid (drops 20%)  Medication Instructions:  No changes  If you need a refill on your cardiac medications before your next appointment, please call your pharmacy.   Lab work: No new labs needed  Testing/Procedures: Your physician has requested that you have an echocardiogram. Echocardiography is a painless test that uses sound waves to create images of your heart. It provides your doctor with information about the size and shape of your heart and how well your heart's chambers and valves are working.   You may receive an ultrasound enhancing agent through an IV if needed to better visualize your heart during the echo. This procedure takes approximately one hour.  There are no restrictions for this procedure.  This will take place at [address] [facility]) [address], [place] [address]  Please note: We ask at that you not bring children with you during ultrasound (echo/ vascular) testing. Due to room size and safety concerns, children are not allowed in the ultrasound rooms during exams. Our front office staff cannot provide observation of children in our lobby area while testing is being conducted. An adult accompanying a patient to their appointment will only be allowed in the ultrasound room at the discretion of the ultrasound technician under special circumstances. We apologize for any inconvenience.   Follow-Up: At [facility], you and your health needs are our priority.  As part of our continuing mission to provide you with exceptional heart care, we have created designated Provider Care Teams.  These Care Teams include your primary Cardiologist (physician) and Advanced Practice Providers (APPs -  Physician Assistants and Nurse Practitioners) who all work together to provide you with the care you need, when you need it.  You will need a follow up appointment as needed  Providers on your  designated Care Team:   Lonni Meager, NP Bernardino Bring, PA-C Cadence [place], [place]  COVID-19 Vaccine Information can be found at: [email or web address] For questions related to vaccine distribution or appointments, please email [email or web address] or call [phone].

## 2024-02-10 ENCOUNTER — Other Ambulatory Visit: Payer: Self-pay | Admitting: Family Medicine

## 2024-02-10 DIAGNOSIS — Z1231 Encounter for screening mammogram for malignant neoplasm of breast: Secondary | ICD-10-CM

## 2024-03-24 ENCOUNTER — Ambulatory Visit
Admission: RE | Admit: 2024-03-24 | Discharge: 2024-03-24 | Disposition: A | Discharge status: Home / Self Care | Source: Ambulatory Visit | Attending: Family Medicine | Admitting: Family Medicine

## 2024-03-24 DIAGNOSIS — Z1231 Encounter for screening mammogram for malignant neoplasm of breast: Secondary | ICD-10-CM | POA: Diagnosis not present

## 2024-03-27 ENCOUNTER — Encounter: Payer: Self-pay | Admitting: Family Medicine

## 2024-03-27 DIAGNOSIS — K219 Gastro-esophageal reflux disease without esophagitis: Secondary | ICD-10-CM

## 2024-03-27 DIAGNOSIS — R7303 Prediabetes: Secondary | ICD-10-CM

## 2024-03-27 DIAGNOSIS — E890 Postprocedural hypothyroidism: Secondary | ICD-10-CM

## 2024-03-27 DIAGNOSIS — E7800 Pure hypercholesterolemia, unspecified: Secondary | ICD-10-CM

## 2024-03-27 NOTE — Telephone Encounter (Signed)
 Yes those are the correct labs - ok to use dx HLD, preDM, acid reflux, and hypothyroidism from her problem list to associate.

## 2024-03-28 ENCOUNTER — Other Ambulatory Visit

## 2024-03-28 ENCOUNTER — Ambulatory Visit: Payer: Self-pay | Admitting: Family Medicine

## 2024-03-29 ENCOUNTER — Ambulatory Visit: Attending: Cardiovascular Disease

## 2024-03-29 DIAGNOSIS — I471 Supraventricular tachycardia, unspecified: Secondary | ICD-10-CM

## 2024-03-29 LAB — ECHOCARDIOGRAM COMPLETE
AR max vel: 2.31 cm2
AV Area VTI: 2.39 cm2
AV Area mean vel: 2.33 cm2
AV Mean grad: 5 mmHg
AV Peak grad: 8.5 mmHg
Ao pk vel: 1.46 m/s
Area-P 1/2: 4.39 cm2
S' Lateral: 2.46 cm

## 2024-03-30 ENCOUNTER — Other Ambulatory Visit: Payer: Self-pay | Admitting: Family Medicine

## 2024-03-31 DIAGNOSIS — E7800 Pure hypercholesterolemia, unspecified: Secondary | ICD-10-CM | POA: Diagnosis not present

## 2024-03-31 DIAGNOSIS — E890 Postprocedural hypothyroidism: Secondary | ICD-10-CM | POA: Diagnosis not present

## 2024-03-31 DIAGNOSIS — R7303 Prediabetes: Secondary | ICD-10-CM | POA: Diagnosis not present

## 2024-03-31 DIAGNOSIS — K219 Gastro-esophageal reflux disease without esophagitis: Secondary | ICD-10-CM | POA: Diagnosis not present

## 2024-03-31 NOTE — Telephone Encounter (Signed)
 Requested Prescriptions  Pending Prescriptions Disp Refills   rosuvastatin  (CRESTOR ) 5 MG tablet [Pharmacy Med Name: ROSUVASTATIN  CALCIUM  5 MG TAB] 90 tablet 1    Sig: TAKE 1 TABLET (5 MG TOTAL) BY MOUTH DAILY.     Cardiovascular:  Antilipid - Statins 2 Failed - 03/31/2024  3:24 PM      Failed - Lipid Panel in normal range within the last 12 months    Cholesterol, Total  Date Value Ref Range Status  11/04/2023 169 100 - 199 mg/dL Final   Cholesterol  Date Value Ref Range Status  02/01/2014 205 (H) 0 - 200 mg/dL Final   Ldl Cholesterol, Calc  Date Value Ref Range Status  02/01/2014 117 (H) 0 - 100 mg/dL Final   LDL Chol Calc (NIH)  Date Value Ref Range Status  11/04/2023 82 0 - 99 mg/dL Final   HDL Cholesterol  Date Value Ref Range Status  02/01/2014 56 40 - 60 mg/dL Final   HDL  Date Value Ref Range Status  11/04/2023 72 >39 mg/dL Final   Triglycerides  Date Value Ref Range Status  11/04/2023 80 0 - 149 mg/dL Final  90/82/7984 839 0 - 200 mg/dL Final         Passed - Cr in normal range and within 360 days    Creatinine  Date Value Ref Range Status  06/27/2012 0.69 0.60 - 1.30 mg/dL Final   Creatinine, Ser  Date Value Ref Range Status  11/04/2023 0.92 0.57 - 1.00 mg/dL Final         Passed - Patient is not pregnant      Passed - Valid encounter within last 12 months    Recent Outpatient Visits           4 months ago Hypothyroidism associated with surgical procedure   [facility], Jon HERO, MD

## 2024-04-01 ENCOUNTER — Ambulatory Visit: Payer: Self-pay | Admitting: Cardiovascular Disease

## 2024-04-01 LAB — LIPID PANEL WITH LDL/HDL RATIO
Cholesterol, Total: 183 mg/dL (ref 100–199)
HDL: 75 mg/dL (ref >39)
LDL Chol Calc (NIH): 93 mg/dL (ref 0–99)
LDL/HDL Ratio: 1.2 ratio (ref 0.0–3.2)
Triglycerides: 80 mg/dL (ref 0–149)
VLDL Cholesterol Cal: 15 mg/dL (ref 5–40)

## 2024-04-01 LAB — CBC WITH DIFFERENTIAL/PLATELET
Basophils Absolute: 0.1 x10E3/uL (ref 0.0–0.2)
Basos: 1 %
EOS (ABSOLUTE): 0.2 x10E3/uL (ref 0.0–0.4)
Eos: 3 %
Hematocrit: 41.3 % (ref 34.0–46.6)
Hemoglobin: 13.7 g/dL (ref 11.1–15.9)
Immature Grans (Abs): 0 x10E3/uL (ref 0.0–0.1)
Immature Granulocytes: 0 %
Lymphocytes Absolute: 2.5 x10E3/uL (ref 0.7–3.1)
Lymphs: 40 %
MCH: 30.7 pg (ref 26.6–33.0)
MCHC: 33.2 g/dL (ref 31.5–35.7)
MCV: 93 fL (ref 79–97)
Monocytes Absolute: 0.7 x10E3/uL (ref 0.1–0.9)
Monocytes: 11 %
Neutrophils Absolute: 2.8 x10E3/uL (ref 1.4–7.0)
Neutrophils: 45 %
Platelets: 292 x10E3/uL (ref 150–450)
RBC: 4.46 x10E6/uL (ref 3.77–5.28)
RDW: 12.6 % (ref 11.7–15.4)
WBC: 6.2 x10E3/uL (ref 3.4–10.8)

## 2024-04-01 LAB — HEMOGLOBIN A1C
Est. average glucose Bld gHb Est-mCnc: 120 mg/dL
Hgb A1c MFr Bld: 5.8 % — ABNORMAL HIGH (ref 4.8–5.6)

## 2024-04-01 LAB — COMPREHENSIVE METABOLIC PANEL WITH GFR
ALT: 13 IU/L (ref 0–32)
AST: 27 IU/L (ref 0–40)
Albumin: 4.2 g/dL (ref 3.8–4.8)
Alkaline Phosphatase: 84 IU/L (ref 49–135)
BUN/Creatinine Ratio: 23 (ref 12–28)
BUN: 20 mg/dL (ref 8–27)
Bilirubin Total: 0.5 mg/dL (ref 0.0–1.2)
CO2: 25 mmol/L (ref 20–29)
Calcium: 9.8 mg/dL (ref 8.7–10.3)
Chloride: 103 mmol/L (ref 96–106)
Creatinine, Ser: 0.86 mg/dL (ref 0.57–1.00)
Globulin, Total: 2.1 g/dL (ref 1.5–4.5)
Glucose: 87 mg/dL (ref 70–99)
Potassium: 4.5 mmol/L (ref 3.5–5.2)
Sodium: 143 mmol/L (ref 134–144)
Total Protein: 6.3 g/dL (ref 6.0–8.5)
eGFR: 69 mL/min/1.73 (ref >59)

## 2024-04-01 LAB — TSH: TSH: 2.46 u[IU]/mL (ref 0.450–4.500)

## 2024-04-03 ENCOUNTER — Ambulatory Visit: Payer: Self-pay | Admitting: Family Medicine

## 2024-04-05 DIAGNOSIS — H5213 Myopia, bilateral: Secondary | ICD-10-CM | POA: Diagnosis not present

## 2024-04-06 ENCOUNTER — Encounter: Payer: Self-pay | Admitting: Family Medicine

## 2024-04-06 ENCOUNTER — Ambulatory Visit: Admitting: Family Medicine

## 2024-04-06 VITALS — BP 110/51 | HR 59 | Ht 64.5 in | Wt 124.6 lb

## 2024-04-06 DIAGNOSIS — R7303 Prediabetes: Secondary | ICD-10-CM | POA: Diagnosis not present

## 2024-04-06 DIAGNOSIS — Z0000 Encounter for general adult medical examination without abnormal findings: Secondary | ICD-10-CM

## 2024-04-06 DIAGNOSIS — R001 Bradycardia, unspecified: Secondary | ICD-10-CM | POA: Diagnosis not present

## 2024-04-06 DIAGNOSIS — E890 Postprocedural hypothyroidism: Secondary | ICD-10-CM

## 2024-04-06 DIAGNOSIS — I471 Supraventricular tachycardia, unspecified: Secondary | ICD-10-CM

## 2024-04-06 DIAGNOSIS — Z0001 Encounter for general adult medical examination with abnormal findings: Secondary | ICD-10-CM | POA: Diagnosis not present

## 2024-04-06 MED ORDER — ROSUVASTATIN CALCIUM 5 MG PO TABS: 5.0000 mg | ORAL_TABLET | ORAL | 3 refills | Status: AC

## 2024-04-06 NOTE — Assessment & Plan Note (Signed)
 TSH levels normal, indicating well-managed thyroid  function. - Continue current thyroid  management

## 2024-04-06 NOTE — Progress Notes (Signed)
 Annual Wellness Visit   Patient: Sonya Barnes, Female    DOB: [birth year], 78 y.o.   MRN: [id number]  Chief Complaint  Patient presents with   Annual Exam    Last completed 03/30/23    Subjective:    Sonya Barnes is a 78 y.o. female who presents today for her Annual Wellness Visit.  HPI  Discussed the use of AI scribe software for clinical note transcription with the patient, who gave verbal consent to proceed.  History of Present Illness   Sonya Barnes is a 78 year old female who presents for a wellness visit and follow-up on heart palpitations.  She experiences daily episodes of supraventricular tachycardia, with symptoms that are sometimes noticeable. Since June, she has had one significant episode lasting about an hour, which was not particularly bothersome. She is not taking new medications for this condition and prefers to avoid them due to minimal symptoms.  Her family history includes her father having a pacemaker and defibrillator, and her mother and sister having unspecified heart issues. She maintains a healthier lifestyle compared to her family members and monitors her bradycardia, noting a baseline low heart rate.  She has reduced her rosuvastatin  dosage to every other day since August 1st due to persistent fatigue, which has improved her energy levels. Her cholesterol levels have slightly increased but remain within a satisfactory range.  She is experiencing issues with voiding, which have progressively worsened, and has an upcoming appointment to address this concern. Recent lab results show normal kidney and liver function, electrolytes, blood counts, and thyroid  function. Her A1c is at 5.8, indicating low prediabetes, which she is monitoring by watching her carbohydrate intake and maintaining an active lifestyle.           Medications: Outpatient Medications Prior to Visit  Medication Sig   ALPRAZolam  (XANAX ) 0.5 MG tablet TAKE 0.5-1 TABLETS (0.25-0.5 MG TOTAL)  BY MOUTH 2 (TWO) TIMES DAILY AS NEEDED.   Calcium -Vitamin D -Vitamin K (VIACTIV PO) Take by mouth daily.   Cholecalciferol (VITAMIN D -1000 MAX ST) 25 MCG (1000 UT) tablet Take 1 tablet by mouth daily.   Coenzyme Q10 100 MG capsule Take 100 mg by mouth.   famotidine (PEPCID) 20 MG tablet Take 20 mg by mouth 2 (two) times daily.   gabapentin  (NEURONTIN ) 300 MG capsule TAKE 1 CAPSULE BY MOUTH NIGHTLY   levothyroxine  (SYNTHROID ) 50 MCG tablet Take 1 tablet (50 mcg total) by mouth daily.   loratadine (CLARITIN) 10 MG tablet Take 10 mg by mouth daily.   meloxicam (MOBIC) 7.5 MG tablet Take 7.5 mg by mouth daily.   Multiple Vitamin (MULTIVITAMIN) tablet Take 1 tablet by mouth daily.   omeprazole  (PRILOSEC) 40 MG capsule TAKE 1 CAPSULE (40 MG TOTAL) BY MOUTH DAILY.   Polyethyl Glycol-Propyl Glycol (SYSTANE ULTRA OP) Apply to eye.   [DISCONTINUED] rosuvastatin  (CRESTOR ) 5 MG tablet TAKE 1 TABLET (5 MG TOTAL) BY MOUTH DAILY.   No facility-administered medications prior to visit.    No Known Allergies  Patient Care Team: Myrla Jon HERO, MD as PCP - General (Family Medicine)  ROS   Objective:  Objective  BP (!) 110/51 (BP Location: Left Arm, Patient Position: Sitting, Cuff Size: Normal)   Pulse (!) 59   Ht 5' 4.5 (1.638 m)   Wt 124 lb 9.6 oz (56.5 kg)   SpO2 98%   BMI 21.06 kg/m    Physical Exam Vitals reviewed.  Constitutional:      General: She is  not in acute distress.    Appearance: Normal appearance. She is well-developed. She is not diaphoretic.  HENT:     Head: Normocephalic and atraumatic.     Right Ear: Tympanic membrane, ear canal and external ear normal.     Left Ear: Tympanic membrane, ear canal and external ear normal.     Nose: Nose normal.     Mouth/Throat:     Mouth: Mucous membranes are moist.     Pharynx: Oropharynx is clear. No oropharyngeal exudate.  Eyes:     General: No scleral icterus.    Conjunctiva/sclera: Conjunctivae normal.     Pupils: Pupils  are equal, round, and reactive to light.  Neck:     Thyroid : No thyromegaly.  Cardiovascular:     Rate and Rhythm: Normal rate and regular rhythm.     Heart sounds: Normal heart sounds. No murmur heard. Pulmonary:     Effort: Pulmonary effort is normal. No respiratory distress.     Breath sounds: Normal breath sounds. No wheezing or rales.  Abdominal:     General: There is no distension.     Palpations: Abdomen is soft.     Tenderness: There is no abdominal tenderness.  Musculoskeletal:        General: No deformity.     Cervical back: Neck supple.     Right lower leg: No edema.     Left lower leg: No edema.  Lymphadenopathy:     Cervical: No cervical adenopathy.  Skin:    General: Skin is warm and dry.     Findings: No rash.  Neurological:     Mental Status: She is alert and oriented to person, place, and time. Mental status is at baseline.     Gait: Gait normal.  Psychiatric:        Mood and Affect: Mood normal.        Behavior: Behavior normal.        Thought Content: Thought content normal.        Most recent depression screenings:    03/30/2023   11:02 AM 08/13/2022   11:19 AM  PHQ 2/9 Scores  PHQ - 2 Score 0 2  PHQ- 9 Score 1  4      Data saved with a previous flowsheet row definition    Visit info / Clinical Intake: Medicare Wellness Visit Type:: Subsequent Annual Wellness Visit Persons participating in visit:: patient Medicare Wellness Visit Mode:: In-person (required for WTM) Information given by:: patient Interpreter Needed?: No Pre-visit prep was completed: no AWV questionnaire completed by patient prior to visit?: no Living arrangements:: lives with spouse/significant other Patient's Overall Health Status Rating: excellent Typical amount of pain: none Does pain affect daily life?: no Are you currently prescribed opioids?: no  Dietary Habits and Nutritional Risks How many meals a day?: 2 Eats fruit and vegetables daily?: (!) no Most meals are  obtained by: preparing own meals In the last 2 weeks, have you had any of the following?: none Diabetic:: no  Functional Status Activities of Daily Living (to include ambulation/medication): Independent Ambulation: Independent Medication Administration: Independent Home Management: Independent Manage your own finances?: (!) no Primary transportation is: driving Concerns about vision?: no *vision screening is required for WTM* Concerns about hearing?: no  Fall Screening Falls in the past year?: 0 Number of falls in past year: 0 Was there an injury with Fall?: 0 Fall Risk Category Calculator: 0 Patient Fall Risk Level: Low Fall Risk  Fall Risk Patient at Risk  for Falls Due to: No Fall Risks Fall risk Follow up: Falls evaluation completed  Home and Transportation Safety: All rugs have non-skid backing?: yes All stairs or steps have railings?: (!) no Grab bars in the bathtub or shower?: yes Have non-skid surface in bathtub or shower?: yes Good home lighting?: yes Regular seat belt use?: yes Hospital stays in the last year:: no  Cognitive Assessment Difficulty concentrating, remembering, or making decisions? : no Will 6CIT or Mini Cog be Completed: no 6CIT or Mini Cog Declined: patient alert, oriented, able to answer questions appropriately and recall recent events  Advance Directives (For Healthcare) Does Patient Have a Medical Advance Directive?: Yes Does patient want to make changes to medical advance directive?: No - Patient declined Type of Advance Directive: Healthcare Power of Attorney Copy of Healthcare Power of Attorney in Chart?: No - copy requested  Reviewed/Updated  Reviewed/Updated: Reviewed All (Medical, Surgical, Family, Medications, Allergies, Care Teams, Patient Goals)    Vision/Hearing Screen: No results found.   No results found for any visits on 04/06/24.   Assessment & Plan:    Annual wellness visit done today including the all of the  following: Reviewed patient's Family and Medical History Reviewed and updated list of patient's medical providers Assessment of cognitive impairment was done Assessed patient's functional ability Established a written schedule for health screening services Health Risk Assessment Completed and Reviewed  Exercise Activities and Dietary recommendations  Goals      Exercise 150 minutes per week (moderate activity)     Increase water  intake     Recommend increasing water  intake to 6-8 glasses of water  a day.         Immunization History  Administered Date(s) Administered   DTaP 05/18/1996   Fluad Quad(high Dose 65+) 02/23/2019, 02/06/2020, 02/22/2021, 02/02/2022, 02/18/2023   INFLUENZA, HIGH DOSE SEASONAL PF 02/27/2016, 02/11/2017, 01/21/2018, 02/05/2024   Influenza-Unspecified 01/17/2015, 02/07/2024   PFIZER(Purple Top)SARS-COV-2 Vaccination 06/23/2019, 07/17/2019   Pfizer Covid-19 Vaccine Bivalent Booster 42yrs & up 04/22/2020, 12/03/2020   Pfizer(Comirnaty)Fall Seasonal Vaccine 12 years and older 03/25/2022   Pneumococcal Conjugate-13 01/26/2014   Pneumococcal Polysaccharide-23 02/15/2015   Td 02/23/2019   Zoster Recombinant(Shingrix) 11/14/2020, 01/30/2021   Zoster, Live 12/24/2010    Health Maintenance  Topic Date Due   COVID-19 Vaccine (6 - 2025-26 season) 01/17/2024   Medicare Annual Wellness (AWV)  04/06/2025   DTaP/Tdap/Td (3 - Tdap) 02/22/2029   Pneumococcal Vaccine: 50+ Years  Completed   Influenza Vaccine  Completed   Bone Density Scan  Completed   Hepatitis C Screening  Completed   Zoster Vaccines- Shingrix  Completed   Meningococcal B Vaccine  Aged Out   Mammogram  Discontinued   Colonoscopy  Discontinued    Discussed health benefits of physical activity, and encouraged her to engage in regular exercise appropriate for her age and condition.    Problem List Items Addressed This Visit       Cardiovascular and Mediastinum   SVT (supraventricular  tachycardia)   Echocardiogram normal, ruling out structural heart issues. Minimal symptoms with no new medications preferred. Bradycardia monitored, no immediate intervention needed unless heart rate drops significantly. Discussed electrophysiologist referral if symptoms worsen or become bothersome. - Monitor palpitations and bradycardia - Contact provider if symptoms worsen or become bothersome for electrophysiologist referral      Relevant Medications   rosuvastatin  (CRESTOR ) 5 MG tablet     Endocrine   Hypothyroidism associated with surgical procedure   TSH levels normal, indicating well-managed thyroid   function. - Continue current thyroid  management         Other   Pre-diabetes   A1c at 5.8%, consistent with low prediabetes. No immediate intervention required. Emphasis on lifestyle modifications to prevent progression to diabetes. - Continue monitoring A1c - Maintain healthy lifestyle with attention to carbohydrate intake      Bradycardia   Echocardiogram normal, ruling out structural heart issues. Minimal symptoms with no new medications preferred. Bradycardia monitored, no immediate intervention needed unless heart rate drops significantly. Discussed electrophysiologist referral if symptoms worsen or become bothersome. - Monitor palpitations and bradycardia - Contact provider if symptoms worsen or become bothersome for electrophysiologist referral      Other Visit Diagnoses       Encounter for annual wellness visit (AWV) in Medicare patient    -  Primary     Encounter for annual physical exam               Adult Wellness Visit Routine wellness visit with all screenings and vaccinations up to date. Discussed RSV and COVID vaccinations as optional protective measures. Colonoscopy screening is complete due to age and absence of polyps. - Consider RSV vaccine at pharmacy - Continue annual COVID booster at pharmacy - No further colonoscopy needed for screening       Return in about 6 months (around 10/04/2024) for chronic disease f/u.      Jon Eva, MD [facility]

## 2024-04-06 NOTE — Assessment & Plan Note (Signed)
 A1c at 5.8%, consistent with low prediabetes. No immediate intervention required. Emphasis on lifestyle modifications to prevent progression to diabetes. - Continue monitoring A1c - Maintain healthy lifestyle with attention to carbohydrate intake

## 2024-04-06 NOTE — Assessment & Plan Note (Signed)
 Echocardiogram normal, ruling out structural heart issues. Minimal symptoms with no new medications preferred. Bradycardia monitored, no immediate intervention needed unless heart rate drops significantly. Discussed electrophysiologist referral if symptoms worsen or become bothersome. - Monitor palpitations and bradycardia - Contact provider if symptoms worsen or become bothersome for electrophysiologist referral

## 2024-04-07 ENCOUNTER — Ambulatory Visit: Admitting: Urology

## 2024-04-07 VITALS — BP 150/68 | HR 68 | Ht 64.5 in | Wt 122.0 lb

## 2024-04-07 DIAGNOSIS — R399 Unspecified symptoms and signs involving the genitourinary system: Secondary | ICD-10-CM

## 2024-04-07 DIAGNOSIS — N3592 Unspecified urethral stricture, female: Secondary | ICD-10-CM | POA: Diagnosis not present

## 2024-04-07 NOTE — Progress Notes (Signed)
   04/07/2024 9:33 AM   Sonya Barnes 1945-10-29 [id number]  Reason for visit: Follow up urethral stricture, urethral caruncle  History: Previously followed by Dr. Penne, my initial visit with her November 2025 Initially presented November 2022 with dysuria and weak stream, found to have true female urethral stricture January 2023 on cystoscopy which was dilated with sounds Her strength of stream improved, however she reports severe dysuria for at least 1 year after that procedure Has been on topical estrogen cream previously for urethral caruncle, has not used recently I reviewed the prior notes from Dr. Penne extensively  Physical Exam: BP (!) 150/68 (BP Location: Left Arm, Patient Position: Sitting, Cuff Size: Normal)   Pulse 68   Ht 5' 4.5 (1.638 m)   Wt 122 lb (55.3 kg)   SpO2 99%   BMI 20.62 kg/m    Today: Reports recurrence of weak urinary stream and sensation of incomplete emptying Denies any gross hematuria or UTIs  Plan:   Weak stream/history of urethral stricture: Symptoms certainly seem concerning for recurrence of urethral stricture.  We discussed female urethral stricture is rare.  She is very concerned about a repeat dilation with her severe dysuria after the procedure for up to 1 year.  I did some research on Optilume balloon dilation for female patients, and a fair amount of success has been documented with no incident of incontinence or severe dysuria afterwards.  I recommended starting with cystoscopy, and if recurrence of urethral stricture moving forward with Optilume balloon dilation in the OR with sedation Follow-up for cystoscopy   Redell JAYSON Burnet, MD  [facility] [address], [address] [place], [place] [address] [phone]

## 2024-04-07 NOTE — Patient Instructions (Addendum)
 If you have recurrence of stricture, Optilume balloon dilation may be a treatment option to prevent further recurrence  Cystoscopy Cystoscopy is a procedure that is used to help diagnose and sometimes treat conditions that affect the lower urinary tract. The lower urinary tract includes the bladder and the urethra. The urethra is the tube that drains urine from the bladder. Cystoscopy is done using a thin, tube-shaped instrument with a light and camera at the end (cystoscope). The cystoscope may be hard or flexible, depending on the goal of the procedure. The cystoscope is inserted through the urethra, into the bladder. Cystoscopy may be recommended if you have: Urinary tract infections that keep coming back. Blood in the urine (hematuria). An inability to control when you urinate (urinary incontinence) or an overactive bladder. Unusual cells found in a urine sample. A blockage in the urethra, such as a urinary stone. Painful urination. An abnormality in the bladder found during an intravenous pyelogram (IVP) or CT scan. What are the risks? Generally, this is a safe procedure. However, problems may occur, including: Infection. Bleeding.  What happens during the procedure?  You will be given one or more of the following: A medicine to numb the area (local anesthetic). The area around the opening of your urethra will be cleaned. The cystoscope will be passed through your urethra into your bladder. Germ-free (sterile) fluid will flow through the cystoscope to fill your bladder. The fluid will stretch your bladder so that your health care provider can clearly examine your bladder walls. Your doctor will look at the urethra and bladder. The cystoscope will be removed The procedure may vary among health care providers  What can I expect after the procedure? After the procedure, it is common to have: Some soreness or pain in your urethra. Urinary symptoms. These include: Mild pain or burning  when you urinate. Pain should stop within a few minutes after you urinate. This may last for up to a few days after the procedure. A small amount of blood in your urine for several days. Feeling like you need to urinate but producing only a small amount of urine. Follow these instructions at home: General instructions Return to your normal activities as told by your health care provider.  Drink plenty of fluids after the procedure. Keep all follow-up visits as told by your health care provider. This is important. Contact a health care provider if you: Have pain that gets worse or does not get better with medicine, especially pain when you urinate lasting longer than 72 hours after the procedure. Have trouble urinating. Get help right away if you: Have blood clots in your urine. Have a fever or chills. Are unable to urinate. Summary Cystoscopy is a procedure that is used to help diagnose and sometimes treat conditions that affect the lower urinary tract. Cystoscopy is done using a thin, tube-shaped instrument with a light and camera at the end. After the procedure, it is common to have some soreness or pain in your urethra. It is normal to have blood in your urine after the procedure.  If you were prescribed an antibiotic medicine, take it as told by your health care provider.  This information is not intended to replace advice given to you by your health care provider. Make sure you discuss any questions you have with your health care provider. Document Revised: 04/26/2018 Document Reviewed: 04/26/2018 Elsevier Patient Education  2020 [employer].

## 2024-04-19 ENCOUNTER — Other Ambulatory Visit: Admitting: Urology

## 2024-04-25 ENCOUNTER — Other Ambulatory Visit: Admitting: Urology

## 2024-04-29 ENCOUNTER — Other Ambulatory Visit: Payer: Self-pay | Admitting: Family Medicine

## 2024-04-29 DIAGNOSIS — K219 Gastro-esophageal reflux disease without esophagitis: Secondary | ICD-10-CM

## 2024-05-05 ENCOUNTER — Encounter: Payer: Self-pay | Admitting: Nurse Practitioner

## 2024-05-05 ENCOUNTER — Ambulatory Visit (INDEPENDENT_AMBULATORY_CARE_PROVIDER_SITE_OTHER): Admitting: Nurse Practitioner

## 2024-05-05 VITALS — BP 128/88 | HR 77 | Temp 97.7°F | Ht 64.5 in | Wt 124.0 lb

## 2024-05-05 DIAGNOSIS — J069 Acute upper respiratory infection, unspecified: Secondary | ICD-10-CM | POA: Diagnosis not present

## 2024-05-05 DIAGNOSIS — R062 Wheezing: Secondary | ICD-10-CM | POA: Diagnosis not present

## 2024-05-05 DIAGNOSIS — J40 Bronchitis, not specified as acute or chronic: Secondary | ICD-10-CM | POA: Diagnosis not present

## 2024-05-05 MED ORDER — FLUTICASONE PROPIONATE 50 MCG/ACT NA SUSP: 2.0000 | Freq: Every day | NASAL | 0 refills | Status: DC

## 2024-05-05 MED ORDER — AZITHROMYCIN 250 MG PO TABS: ORAL_TABLET | ORAL | 0 refills | Status: AC

## 2024-05-05 NOTE — Progress Notes (Signed)
 "  BP 128/88   Pulse 77   Temp 97.7 F (36.5 C)   Ht 5' 4.5 (1.638 m)   Wt 124 lb (56.2 kg)   SpO2 97%   BMI 20.96 kg/m    Subjective:    Patient ID: Sonya Barnes, female    DOB: 1946/03/21, 78 y.o.   MRN: [id number]  HPI: Sonya Barnes is a 78 y.o. female  Chief Complaint  Patient presents with   Cough    Pt c/o cough, sore throat, congestion x1 week.    Discussed the use of AI scribe software for clinical note transcription with the patient, who gave verbal consent to proceed.  History of Present Illness Sonya Barnes is a 78 year old female who presents with a cough, sore throat, and congestion.  Upper respiratory symptoms - Cough, sore throat, and congestion present for over one week - Symptoms began on Monday, initially improved by Wednesday, then worsened by Thursday evening - Cough described as 'awful' and persistent - No shortness of breath with exertion  Chest tightness and wheezing - Chest tightness and wheezing became more pronounced last night  Medication use and response - Using over-the-counter Tosin cough medicine every four hours - Using a cold and flu formula from [facility] for three to four days - Takes loratadine 10 mg nightly - Avoids Benadryl due to poor tolerance - Symptoms have persisted despite self-care measures  Antibiotic use and medication tolerance - Previously used azithromycin  and Augmentin  for similar symptoms - Has a sensitive stomach and is cautious with antibiotic use - Knows how to use an inhaler but prefers to avoid unless necessary         05/05/2024   10:05 AM 03/30/2023   11:02 AM 08/13/2022   11:19 AM  Depression screen PHQ 2/9  Decreased Interest 0 0 1  Down, Depressed, Hopeless 0 0 1  PHQ - 2 Score 0 0 2  Altered sleeping  1 0  Tired, decreased energy  0 1  Change in appetite  0 0  Feeling bad or failure about yourself   0 0  Trouble concentrating  0 1  Moving slowly or fidgety/restless  0 0  Suicidal thoughts  0  0  PHQ-9 Score  1  4   Difficult doing work/chores  Not difficult at all Not difficult at all     Data saved with a previous flowsheet row definition    Relevant past medical, surgical, family and social history reviewed and updated as indicated. Interim medical history since our last visit reviewed. Allergies and medications reviewed and updated.  Review of Systems  Ten systems reviewed and is negative except as mentioned in HPI      Objective:      BP 128/88   Pulse 77   Temp 97.7 F (36.5 C)   Ht 5' 4.5 (1.638 m)   Wt 124 lb (56.2 kg)   SpO2 97%   BMI 20.96 kg/m    Wt Readings from Last 3 Encounters:  05/05/24 124 lb (56.2 kg)  04/07/24 122 lb (55.3 kg)  04/06/24 124 lb 9.6 oz (56.5 kg)    Physical Exam GENERAL: Alert, cooperative, well developed, no acute distress. HEENT: Normocephalic, normal oropharynx, moist mucous membranes. CHEST: Wheezing heard on auscultation. CARDIOVASCULAR: Normal heart rate and rhythm, S1 and S2 normal without murmurs. ABDOMEN: Soft, non-tender, non-distended, without organomegaly, normal bowel sounds. EXTREMITIES: No cyanosis or edema. NEUROLOGICAL: Cranial nerves grossly intact, moves all extremities without  gross motor or sensory deficit.  Results for orders placed or performed in visit on 03/29/24  ECHOCARDIOGRAM COMPLETE   Collection Time: 03/29/24 12:32 PM  Result Value Ref Range   AR max vel 2.31 cm2   AV Peak grad 8.5 mmHg   Ao pk vel 1.46 m/s   S' Lateral 2.46 cm   Area-P 1/2 4.39 cm2   AV Area VTI 2.39 cm2   AV Mean grad 5.0 mmHg   AV Area mean vel 2.33 cm2   Est EF 60 - 65%           Assessment & Plan:   Problem List Items Addressed This Visit   None Visit Diagnoses       Viral upper respiratory tract infection    -  Primary   Relevant Medications   azithromycin  (ZITHROMAX ) 250 MG tablet   fluticasone  (FLONASE ) 50 MCG/ACT nasal spray     Wheezing       Relevant Medications   azithromycin  (ZITHROMAX ) 250  MG tablet   fluticasone  (FLONASE ) 50 MCG/ACT nasal spray     Bronchitis       Relevant Medications   azithromycin  (ZITHROMAX ) 250 MG tablet   fluticasone  (FLONASE ) 50 MCG/ACT nasal spray        Assessment and Plan Assessment & Plan Acute bronchitis/URI Cough, sore throat, congestion, and chest tightness for over a week. Wheezing present. No shortness of breath. Prefers to avoid inhalers. Azithromycin  chosen over Augmentin  due to less gastrointestinal side effects. - Prescribed azithromycin  (Z-Pak) for infection. - Advised taking loratadine twice daily to dry up secretions. - Prescribed Flonase  nasal spray to open nasal passages. - Instructed to report if symptoms worsen by Monday.        Follow up plan: Return for let me know how you are doing on Monday. "

## 2024-05-05 NOTE — Telephone Encounter (Signed)
 Seen and treated by Mliss Spray, FNP today in office

## 2024-05-05 NOTE — Telephone Encounter (Signed)
 Appt made prior to Nurse Triage- has checked in to Acute visit with Mliss Spray   Copied from CRM [id number]. Topic: Clinical - Red Word Triage >> May 05, 2024  8:44 AM Jasmin G wrote: Red Word that prompted transfer to Nurse Triage: Pt requested to speak to NT due to experiencing a pretty bad cough, accompanied by chest tightness.

## 2024-05-08 ENCOUNTER — Encounter: Payer: Self-pay | Admitting: Nurse Practitioner

## 2024-05-27 ENCOUNTER — Other Ambulatory Visit: Payer: Self-pay | Admitting: Nurse Practitioner

## 2024-05-27 DIAGNOSIS — J069 Acute upper respiratory infection, unspecified: Secondary | ICD-10-CM

## 2024-05-27 DIAGNOSIS — J40 Bronchitis, not specified as acute or chronic: Secondary | ICD-10-CM

## 2024-05-27 DIAGNOSIS — R062 Wheezing: Secondary | ICD-10-CM

## 2024-05-29 NOTE — Telephone Encounter (Signed)
 Requested medications are due for refill today.  yes  Requested medications are on the active medications list.  yes  Last refill. 05/05/2024 16 mL 0 rf  Future visit scheduled.   yes  Notes to clinic.  Mliss Spray signed rx.    Requested Prescriptions  Pending Prescriptions Disp Refills   fluticasone  (FLONASE ) 50 MCG/ACT nasal spray [Pharmacy Med Name: FLUTICASONE  PROP 50 MCG SPRAY] 48 mL 1    Sig: SPRAY 2 SPRAYS INTO EACH NOSTRIL EVERY DAY     Ear, Nose, and Throat: Nasal Preparations - Corticosteroids Passed - 05/29/2024  4:13 PM      Passed - Valid encounter within last 12 months    Recent Outpatient Visits           3 weeks ago Viral upper respiratory tract infection   [facility] Spray Mliss FALCON, FNP   1 month ago Encounter for annual wellness visit (AWV) in Medicare patient   [place] [facility] [place], Jon HERO, MD   6 months ago Hypothyroidism associated with surgical procedure   [facility] [place], Jon HERO, MD

## 2024-05-30 ENCOUNTER — Ambulatory Visit: Admitting: Urology

## 2024-05-30 VITALS — BP 127/80 | HR 85 | Ht 64.5 in | Wt 124.0 lb

## 2024-05-30 DIAGNOSIS — N3592 Unspecified urethral stricture, female: Secondary | ICD-10-CM

## 2024-05-30 DIAGNOSIS — R399 Unspecified symptoms and signs involving the genitourinary system: Secondary | ICD-10-CM | POA: Diagnosis not present

## 2024-05-30 DIAGNOSIS — Z01818 Encounter for other preprocedural examination: Secondary | ICD-10-CM | POA: Diagnosis not present

## 2024-05-30 LAB — MICROSCOPIC EXAMINATION

## 2024-05-30 LAB — URINALYSIS, COMPLETE
Bilirubin, UA: NEGATIVE
Glucose, UA: NEGATIVE
Leukocytes,UA: NEGATIVE
Nitrite, UA: NEGATIVE
Protein,UA: NEGATIVE
RBC, UA: NEGATIVE
Specific Gravity, UA: 1.03 (ref 1.005–1.030)
Urobilinogen, Ur: 0.2 mg/dL (ref 0.2–1.0)
pH, UA: 5.5 (ref 5.0–7.5)

## 2024-05-30 MED ORDER — LIDOCAINE HCL URETHRAL/MUCOSAL 2 % EX GEL
1.0000 | Freq: Once | CUTANEOUS | Status: AC
  Administered 2024-05-30: 1 via URETHRAL

## 2024-05-30 NOTE — Progress Notes (Signed)
 Cystoscopy Procedure Note:  Indication: History of urethral stricture, urethral caruncle  Previously followed by Dr. Penne, my initial visit with her was November 2025.  Reportedly had true female urethral stricture in January 2023 dilated in clinic with sounds-strength of urinary stream improved, but reported severe dysuria for at least 1 year after that procedure.  Also previously was on topical estrogen cream for a urethral caruncle in the past  After informed consent and discussion of the procedure and its risks, Sonya Barnes was positioned and prepped in the standard fashion.  Cystoscopy attempted but visualized a pinpoint stricture in the mid urethra and unable to advance the scope into the bladder.   Findings: Urethral stricture  ------------------------------------------------------  Assessment and Plan: 79 year old female with history of urethral stricture in 2022 with dilation in clinic and subsequent burning for at least 1 year.  We discussed other alternatives like Optilume balloon dilation.  I reviewed data that does show success in female urethral stricture disease with Optilume balloon.  Will also perform cystoscopy at that time.  Risks of dysuria, urgency/frequency, incontinence, recurrence were reviewed.  Schedule Optilume balloon dilation and cystoscopy  Redell Burnet, MD 05/30/2024

## 2024-05-30 NOTE — Patient Instructions (Signed)
 Urethral Stricture  Urethral stricture is when the tube that drains pee (urine) from the bladder out of the body (urethra) becomes too narrow. The urethra can become narrow because of scar tissue, infection, surgery, or an injury. This can make it difficult to pee (urinate). In females, the urethra opens above the vaginal opening. In males, the urethra opens at the tip of the penis, and the urethra is much longer than it is in females. Because of the length of the female urethra, urethral stricture is much more common in males. What are the causes? In males and females, common causes of urethral stricture include: Urinary tract infection (UTI). Sexually transmitted infection (STI). Using a soft tube in the urethra to drain pee from the bladder (urinary catheter). Urinary tract surgery. In males, common causes of urethral stricture include: A severe injury to the pelvis. Prostate surgery. Injury to the penis. In many cases, the cause of urethral stricture is not known. What increases the risk? You are more likely to develop this condition if you: Are female. Males who have had prostate surgery are at risk of developing this condition. Use a urinary catheter. Have had urinary tract surgery. What are the signs or symptoms? The main symptom of this condition is trouble peeing. This may cause decreased pee flow, dribbling, or spraying of pee. Other symptom of this condition may include: Frequent UTIs. Blood in the pee. Pain when peeing. Swelling of the penis in males. Not being able to pee. How is this diagnosed? This condition may be diagnosed based on: Your medical history and a physical exam. Tests of your pee to check for infection or bleeding. X-rays. Ultrasound. Retrograde urethrogram. With this test, a dye is injected into the urethra and then an X-ray is taken. Urethroscopy. This is when a thin tube with a light and camera on the end (urethroscope) is used to look at the urethra. A  CT scan or MRI. How is this treated? This condition is treated with surgery or other procedures. The type of surgery that you have depends on the severity of your condition. You may have: Urethral dilation. In this procedure, the narrow part of the urethra is stretched open (dilated) with dilating instruments or a small balloon. Optilume balloon dilation: A balloon is inflated alongside the urethra to stretch the stricture, the balloon is coated with a medicine that prevents recurrence of the scar tissue/stricture  Follow these instructions at home: Take over-the-counter and prescription medicines only as told by your health care provider. If you were prescribed antibiotics, take them as told by your provider. Do not stop using the antibiotic even if you start to feel better. Drink enough fluid to keep your pee pale yellow. Keep all follow-up visits. Your provider will check your healing and adjust your treatment plan as needed. Contact a health care provider if: You have frequent peeing or you are only peeing small amounts often. You feel the need to pee urgently. You have pain or burning when you pee. Your pee smells bad or unusual. Your pee is bloody or cloudy. You have pain in your lower abdomen or back. Your genital area is swollen, bruised, or discolored. This includes: The penis, scrotum, and inner thighs for males. The outer genital organs (vulva) and inner thighs for females. You have a fever. You develop swelling in your legs. Get help right away if: You cannot pee. You have trouble breathing. These symptoms may be an emergency. Get help right away. Call 911. Do not  wait to see if the symptoms will go away. Do not drive yourself to the hospital. This information is not intended to replace advice given to you by your health care provider. Make sure you discuss any questions you have with your health care provider. Document Revised: 02/26/2022 Document Reviewed:  02/26/2022 Elsevier Patient Education  2024 [employer].

## 2024-05-31 ENCOUNTER — Other Ambulatory Visit: Payer: Self-pay

## 2024-05-31 DIAGNOSIS — N3592 Unspecified urethral stricture, female: Secondary | ICD-10-CM

## 2024-05-31 NOTE — Progress Notes (Signed)
 Surgical Physician Order Form [facility]  Dr. Redell Burnet, MD  * Scheduling expectation : Next Available  *Length of Case: 20 minutes  *Clearance needed: no  *Anticoagulation Instructions: Hold all anticoagulants  *Aspirin Instructions: Ok to continue Aspirin  *Post-op visit Date/Instructions:  1-3 day cath removal  *Diagnosis: Urethral stricture  *Procedure: Cystoscopy, Optilume balloon dilation   Additional orders: N/A  -Admit type: OUTpatient  -Anesthesia: MAC  -VTE Prophylaxis Standing Order SCD's       Other:   -Standing Lab Orders Per Anesthesia    Lab other: UA&Urine Culture  -Standing Test orders EKG/Chest x-ray per Anesthesia       Test other:   - Medications:  Ancef 2gm IV  -Other orders:  N/A

## 2024-06-01 ENCOUNTER — Encounter: Payer: Self-pay | Admitting: Family Medicine

## 2024-06-01 ENCOUNTER — Telehealth: Payer: Self-pay

## 2024-06-01 NOTE — Progress Notes (Signed)
" ° °  [facility] Surgical Posting Form  Surgery Date: Date: 06/12/2024  Surgeon: Dr. Redell Burnet, MD  Inpt ( No  )   Outpt (Yes)   Obs ( No  )   Diagnosis: N35.92 Urethral Stricture  -CPT: 47715  Surgery: Cystoscopy with Urethral Balloon Dilation using Optilume  Stop Anticoagulations: Yes, may continue ASA  Cardiac/Medical/Pulmonary Clearance needed: no  *Orders entered into EPIC  Date: 06/01/24   *Case booked in [place]  Date: 06/01/24  *Notified pt of Surgery: Date: 06/01/24  PRE-OP UA & CX: yes, obtained in clinic on 05/31/2024  *Placed into Prior Authorization Work [place] Date: 06/01/24  Assistant/laser/rep:No                "

## 2024-06-01 NOTE — Telephone Encounter (Signed)
 Per Dr. Francisca, Patient is to be scheduled for Cystoscopy with Urethral Balloon Dilation using Optilume   Sonya Barnes was contacted and possible surgical dates were discussed, Monday January 26th, 2026 was agreed upon for surgery.  Patient was directed to call [phone] between 1-3pm the day before surgery to find out surgical arrival time.  Instructions were given not to eat or drink from midnight on the night before surgery and have a driver for the day of surgery. On the surgery day patient was instructed to enter through the [facility] entrance of [facility] report the Same Day Surgery desk.   Pre-Admit Testing will be in contact via phone to set up an interview with the anesthesia team to review your history and medications prior to surgery.   Reminder of this information was sent via MyChart to the patient.

## 2024-06-05 LAB — CULTURE, URINE COMPREHENSIVE

## 2024-06-07 ENCOUNTER — Other Ambulatory Visit: Payer: Self-pay

## 2024-06-07 ENCOUNTER — Encounter
Admission: RE | Admit: 2024-06-07 | Discharge: 2024-06-07 | Disposition: A | Discharge status: Home / Self Care | Source: Ambulatory Visit | Attending: Urology | Admitting: Urology

## 2024-06-07 HISTORY — DX: Pure hypercholesterolemia, unspecified: E78.00

## 2024-06-07 HISTORY — DX: Bradycardia, unspecified: R00.1

## 2024-06-07 HISTORY — DX: Unspecified urethral stricture, male, unspecified site: N35.919

## 2024-06-07 HISTORY — DX: Supraventricular tachycardia, unspecified: I47.10

## 2024-06-07 HISTORY — DX: Polyp of stomach and duodenum: K31.7

## 2024-06-07 NOTE — Patient Instructions (Signed)
 Your procedure is scheduled on:06-12-24 Monday Report to the Registration Desk on the 1st floor of the [facility].Then proceed to the 2nd floor Surgery Desk To find out your arrival time, please call [phone] between 1PM - 3PM on:06-09-24 Friday If your arrival time is 6:00 am, do not arrive before that time as the [facility] entrance doors do not open until 6:00 am.  REMEMBER: Instructions that are not followed completely may result in serious medical risk, up to and including death; or upon the discretion of your surgeon and anesthesiologist your surgery may need to be rescheduled.  Do not eat food OR drink liquids after midnight the night before surgery.  No gum chewing or hard candies.  One week prior to surgery:Stop NOW (06-07-24) Stop Anti-inflammatories (NSAIDS) such as meloxicam (MOBIC) Advil, Aleve, Ibuprofen, Motrin, Naproxen, Naprosyn and Aspirin based products such as Excedrin, Goody's Powder, BC Powder. Stop ANY OVER THE COUNTER supplements until after surgery (Viactiv, Vitamin D , Multivitamin)  Last dose of COQ- 10 will be on Friday 06-09-24   You may however, continue to take Tylenol  if needed for pain up until the day of surgery.  Continue taking all of your other prescription medications up until the day of surgery.  ON THE DAY OF SURGERY ONLY TAKE THESE MEDICATIONS WITH SIPS OF WATER : -levothyroxine  (SYNTHROID )  -omeprazole  (PRILOSEC)  -You may take ALPRAZolam  (XANAX ) if needed  No Alcohol for 24 hours before or after surgery.  No Smoking including e-cigarettes for 24 hours before surgery.  No chewable tobacco products for at least 6 hours before surgery.  No nicotine patches on the day of surgery.  Do not use any recreational drugs for at least a week (preferably 2 weeks) before your surgery.  Please be advised that the combination of cocaine and anesthesia may have negative outcomes, up to and including death. If you test positive for cocaine, your surgery  will be cancelled.  On the morning of surgery brush your teeth with toothpaste and water , you may rinse your mouth with mouthwash if you wish. Do not swallow any toothpaste or mouthwash.  Do not wear jewelry, make-up, hairpins, clips or nail polish.  For welded (permanent) jewelry: bracelets, anklets, waist bands, etc.  Please have this removed prior to surgery.  If it is not removed, there is a chance that hospital personnel will need to cut it off on the day of surgery.  Do not wear lotions, powders, or perfumes.   Do not shave body hair from the neck down 48 hours before surgery.  Contact lenses, hearing aids and dentures may not be worn into surgery.  Do not bring valuables to the hospital. [facility] is not responsible for any missing/lost belongings or valuables.   Notify your doctor if there is any change in your medical condition (cold, fever, infection).  Wear comfortable clothing (specific to your surgery type) to the hospital.  After surgery, you can help prevent lung complications by doing breathing exercises.  Take deep breaths and cough every 1-2 hours. Your doctor may order a device called an Incentive Spirometer to help you take deep breaths. When coughing or sneezing, hold a pillow firmly against your incision with both hands. This is called splinting. Doing this helps protect your incision. It also decreases belly discomfort.  If you are being admitted to the hospital overnight, leave your suitcase in the car. After surgery it may be brought to your room.  In case of increased patient census, it may be  necessary for you, the patient, to continue your postoperative care in the Same Day Surgery department.  If you are being discharged the day of surgery, you will not be allowed to drive home. You will need a responsible individual to drive you home and stay with you for 24 hours after surgery.   If you are taking public transportation, you will need to have a  responsible individual with you.  Please call the Pre-admissions Testing Dept. at [phone] if you have any questions about these instructions.  Surgery Visitation Policy:  Patients having surgery or a procedure may have two visitors.  Children under the age of 58 must have an adult with them who is not the patient.  Merchandiser, Retail to address health-related social needs:  [email or web address]

## 2024-06-13 ENCOUNTER — Telehealth: Payer: Self-pay

## 2024-06-13 NOTE — Telephone Encounter (Signed)
 Patient had called to cancel surgery due to weather on 06/12/24. I have called pastient and rescheduled her to 06/16/2024. Patient verbalized understangin and OR Grid updated.

## 2024-06-14 ENCOUNTER — Encounter: Admitting: Physician Assistant

## 2024-06-15 MED ORDER — CHLORHEXIDINE GLUCONATE 0.12 % MT SOLN
15.0000 mL | Freq: Once | OROMUCOSAL | Status: AC
  Administered 2024-06-16: 15 mL via OROMUCOSAL

## 2024-06-15 MED ORDER — LACTATED RINGERS IV SOLN: INTRAVENOUS | Status: DC

## 2024-06-15 MED ORDER — CEFAZOLIN SODIUM-DEXTROSE 2-4 GM/100ML-% IV SOLN
2.0000 g | INTRAVENOUS | Status: AC
  Administered 2024-06-16: 2 g via INTRAVENOUS

## 2024-06-15 MED ORDER — ORAL CARE MOUTH RINSE: 15.0000 mL | Freq: Once | OROMUCOSAL | Status: AC

## 2024-06-16 ENCOUNTER — Encounter
Admission: RE | Disposition: A | Discharge status: Home / Self Care | Payer: Self-pay | Source: Home / Self Care | Attending: Urology

## 2024-06-16 ENCOUNTER — Encounter: Payer: Self-pay | Admitting: Urology

## 2024-06-16 ENCOUNTER — Ambulatory Visit: Payer: Self-pay | Admitting: Urgent Care

## 2024-06-16 ENCOUNTER — Ambulatory Visit
Admission: RE | Admit: 2024-06-16 | Discharge: 2024-06-16 | Disposition: A | Discharge status: Home / Self Care | Attending: Urology | Admitting: Urology

## 2024-06-16 ENCOUNTER — Other Ambulatory Visit: Payer: Self-pay

## 2024-06-16 DIAGNOSIS — N3592 Unspecified urethral stricture, female: Secondary | ICD-10-CM | POA: Diagnosis not present

## 2024-06-16 MED ORDER — ACETAMINOPHEN 10 MG/ML IV SOLN
INTRAVENOUS | Status: DC | PRN
  Administered 2024-06-16: 1000 mg via INTRAVENOUS

## 2024-06-16 MED ORDER — DROPERIDOL 2.5 MG/ML IJ SOLN: 0.6250 mg | Freq: Once | INTRAMUSCULAR | Status: DC | PRN

## 2024-06-16 MED ORDER — EPHEDRINE SULFATE-NACL 50-0.9 MG/10ML-% IV SOSY
PREFILLED_SYRINGE | INTRAVENOUS | Status: DC | PRN
  Administered 2024-06-16: 5 mg via INTRAVENOUS

## 2024-06-16 MED ORDER — PROPOFOL 10 MG/ML IV BOLUS
INTRAVENOUS | Status: DC | PRN
  Administered 2024-06-16: 200 mg via INTRAVENOUS

## 2024-06-16 MED ORDER — CEFAZOLIN SODIUM-DEXTROSE 2-4 GM/100ML-% IV SOLN
INTRAVENOUS | Status: AC
  Filled 2024-06-16: qty 100

## 2024-06-16 MED ORDER — LACTATED RINGERS IV SOLN: INTRAVENOUS | Status: DC | PRN

## 2024-06-16 MED ORDER — EPHEDRINE 5 MG/ML INJ
INTRAVENOUS | Status: AC
  Filled 2024-06-16: qty 5

## 2024-06-16 MED ORDER — ACETAMINOPHEN 10 MG/ML IV SOLN
INTRAVENOUS | Status: AC
  Filled 2024-06-16: qty 100

## 2024-06-16 MED ORDER — DEXAMETHASONE SOD PHOSPHATE PF 10 MG/ML IJ SOLN
INTRAMUSCULAR | Status: DC | PRN
  Administered 2024-06-16: 10 mg via INTRAVENOUS

## 2024-06-16 MED ORDER — DEXMEDETOMIDINE HCL IN NACL 200 MCG/50ML IV SOLN
INTRAVENOUS | Status: DC | PRN
  Administered 2024-06-16: 8 ug via INTRAVENOUS

## 2024-06-16 MED ORDER — ONDANSETRON HCL 4 MG/2ML IJ SOLN
INTRAMUSCULAR | Status: DC | PRN
  Administered 2024-06-16: 4 mg via INTRAVENOUS

## 2024-06-16 MED ORDER — CHLORHEXIDINE GLUCONATE 0.12 % MT SOLN
OROMUCOSAL | Status: AC
  Filled 2024-06-16: qty 15

## 2024-06-16 MED ORDER — LIDOCAINE HCL (CARDIAC) PF 100 MG/5ML IV SOSY
PREFILLED_SYRINGE | INTRAVENOUS | Status: DC | PRN
  Administered 2024-06-16: 100 mg via INTRAVENOUS

## 2024-06-16 MED ORDER — FENTANYL CITRATE (PF) 100 MCG/2ML IJ SOLN
INTRAMUSCULAR | Status: AC
  Filled 2024-06-16: qty 2

## 2024-06-16 MED ORDER — FENTANYL CITRATE (PF) 100 MCG/2ML IJ SOLN: 25.0000 ug | INTRAMUSCULAR | Status: DC | PRN

## 2024-06-16 MED ORDER — STERILE WATER FOR IRRIGATION IR SOLN
Status: DC | PRN
  Administered 2024-06-16: 500 mL

## 2024-06-16 MED ORDER — GLYCOPYRROLATE 0.2 MG/ML IJ SOLN
INTRAMUSCULAR | Status: DC | PRN
  Administered 2024-06-16: .2 mg via INTRAVENOUS

## 2024-06-16 MED ORDER — SODIUM CHLORIDE 0.9 % IR SOLN
Status: DC | PRN
  Administered 2024-06-16: 3000 mL

## 2024-06-16 MED ORDER — OXYCODONE HCL 5 MG PO TABS: 5.0000 mg | ORAL_TABLET | Freq: Once | ORAL | Status: DC | PRN

## 2024-06-16 MED ORDER — OXYCODONE HCL 5 MG/5ML PO SOLN: 5.0000 mg | Freq: Once | ORAL | Status: DC | PRN

## 2024-06-16 NOTE — Anesthesia Preprocedure Evaluation (Signed)
"                                    Anesthesia Evaluation  Patient identified by MRN, date of birth, ID band Patient awake    Reviewed: Allergy & Precautions, H&P , NPO status , Patient's Chart, lab work & pertinent test results, reviewed documented beta blocker date and time   History of Anesthesia Complications Negative for: history of anesthetic complications  Airway Mallampati: II  TM Distance: >3 FB Neck ROM: full  Mouth opening: Limited Mouth Opening  Dental  (+) Dental Advidsory Given, Caps, Teeth Intact   Pulmonary neg shortness of breath, neg sleep apnea, neg COPD, Recent URI , Residual Cough   Pulmonary exam normal breath sounds clear to auscultation       Cardiovascular Exercise Tolerance: Good (-) hypertension(-) angina (-) Past MI and (-) Cardiac Stents + dysrhythmias (RBBB) Supra Ventricular Tachycardia (-) Valvular Problems/Murmurs Rhythm:regular Rate:Normal     Neuro/Psych  PSYCHIATRIC DISORDERS Anxiety Depression    negative neurological ROS     GI/Hepatic Neg liver ROS,GERD  Controlled,,  Endo/Other  diabetes (borderline), Well ControlledHypothyroidism    Renal/GU Renal disease (kidney stone)  negative genitourinary   Musculoskeletal   Abdominal   Peds  Hematology negative hematology ROS (+)   Anesthesia Other Findings Past Medical History: No date: Allergy No date: Anxiety No date: Arthritis     Comment:  lower spine No date: Bradycardia No date: Bundle branch block, right No date: Cataract No date: Degenerative disc disease, cervical No date: Depression No date: Gastric polyp No date: GERD (gastroesophageal reflux disease) No date: Hypercholesteremia No date: Hypothyroidism No date: Kidney stone No date: Osteoporosis     Comment:  back, hips No date: Pre-diabetes No date: SVT (supraventricular tachycardia) No date: Tendonitis of elbow, right No date: Urethral stricture   Reproductive/Obstetrics negative OB ROS                               Anesthesia Physical Anesthesia Plan  ASA: 2  Anesthesia Plan: General   Post-op Pain Management:    Induction: Intravenous  PONV Risk Score and Plan: 3 and Ondansetron , Dexamethasone  and Treatment may vary due to age or medical condition  Airway Management Planned: Oral ETT and LMA  Additional Equipment:   Intra-op Plan:   Post-operative Plan: Extubation in OR  Informed Consent: I have reviewed the patients History and Physical, chart, labs and discussed the procedure including the risks, benefits and alternatives for the proposed anesthesia with the patient or authorized representative who has indicated his/her understanding and acceptance.     Dental Advisory Given  Plan Discussed with: Anesthesiologist, CRNA and Surgeon  Anesthesia Plan Comments:         Anesthesia Quick Evaluation  "

## 2024-06-16 NOTE — Op Note (Signed)
 Date of procedure: 06/16/24  Preoperative diagnosis:  Urethral stricture  Postoperative diagnosis:  Same  Procedure: Cystoscopy, Optilume balloon dilation of urethral stricture  Surgeon: Redell Burnet, MD  Anesthesia: General  Complications: None  Intraoperative findings:  Pinpoint urethral stricture Normal cystoscopy with no suspicious lesions, ureteral orifices orthotopic bilaterally Uncomplicated Optilume balloon dilation of urethral stricture and Foley placement  EBL: Minimal  Specimens: None  Drains: 18 French Foley  Indication: Sonya Barnes is a 79 y.o. patient with history of urethral stricture, previously underwent serial dilation with Dr. Penne and had over 1 year of dysuria and worsening symptoms, found to have recurrence of urethral stricture on clinic cystoscopy and opted for Optilume balloon dilation.  After reviewing the management options for treatment, they elected to proceed with the above surgical procedure(s). We have discussed the potential benefits and risks of the procedure, side effects of the proposed treatment, the likelihood of the patient achieving the goals of the procedure, and any potential problems that might occur during the procedure or recuperation. Informed consent has been obtained.  Description of procedure:  The patient was taken to the operating room and general anesthesia was induced. SCDs were placed for DVT prophylaxis. The patient was placed in the dorsal lithotomy position, prepped and draped in the usual sterile fashion, and preoperative antibiotics were administered. A preoperative time-out was performed.   A 17.5 French rigid cystoscope was placed at the urethral meatus and I immediately appreciated a pinpoint urethral stricture.  A sensor wire was passed into the bladder, and a 21 French by 4 cm UroMax balloon was advanced over the wire and inflated to a pressure of 20 ATM in the urethra.  A 17.5 French rigid cystoscope was then  easily advanced alongside the wire into the bladder.  Thorough cystoscopy showed no suspicious lesions, and ureteral orifices orthotopic bilaterally.  I then advanced a 30 French by 3 cm Optilume balloon over the wire with the balloon located along the urethra.  The balloon was inflated to a pressure of 10 ATM and allowed to remain in place for 5 minutes.  Balloon was deflated and removed, the wire was also removed.  An 41 French Foley passed easily into the bladder with return of clear fluid and 10 mL were placed in the balloon.  Disposition: Stable to PACU  Plan: Foley removal as scheduled on Monday, RTC with MD 4 to 6 weeks for PVR and symptom check  Redell Burnet, MD

## 2024-06-16 NOTE — Anesthesia Procedure Notes (Signed)
 Procedure Name: LMA Insertion Date/Time: 06/16/2024 4:31 PM  Performed by: Ledora Duncan, CRNAPre-anesthesia Checklist: Patient identified, Emergency Drugs available, Suction available and Patient being monitored Patient Re-evaluated:Patient Re-evaluated prior to induction Oxygen Delivery Method: Circle system utilized Preoxygenation: Pre-oxygenation with 100% oxygen Induction Type: IV induction LMA: LMA inserted LMA Size: 4.0 Placement Confirmation: positive ETCO2 and breath sounds checked- equal and bilateral Tube secured with: Tape Dental Injury: Teeth and Oropharynx as per pre-operative assessment

## 2024-06-16 NOTE — H&P (Signed)
 "  06/16/24 3:16 PM   Sonya Barnes [birth year] [id number]  CC: Urethral stricture  HPI: 79 year old female with history of urethral stricture in 2022 with dilation in clinic by Dr. Rosina Riis and subsequent burning for at least 1 year.  Recently found to have recurrence of urethral stricture on attempted cystoscopy.  We discussed other alternatives like Optilume balloon dilation. I reviewed data that does show success in female urethral stricture disease with Optilume balloon. Will also perform cystoscopy at that time. Risks of dysuria, urgency/frequency, incontinence, recurrence were reviewed.    PMH: Past Medical History:  Diagnosis Date   Allergy    Anxiety    Arthritis    lower spine   Bradycardia    Bundle branch block, right    Cataract    Degenerative disc disease, cervical    Depression    Gastric polyp    GERD (gastroesophageal reflux disease)    Hypercholesteremia    Hypothyroidism    Kidney stone    Osteoporosis    back, hips   Pre-diabetes    SVT (supraventricular tachycardia)    Tendonitis of elbow, right    Urethral stricture     Surgical History: Past Surgical History:  Procedure Laterality Date   BREAST BIOPSY Left 15 years ago   Core BX of calcs. Negtive   BREAST CYST ASPIRATION Right 15 years ago   Negative   COLONOSCOPY N/A 02/01/2017   Procedure: COLONOSCOPY;  Surgeon: Jinny Carmine, MD;  Location: [facility];  Service: Gastroenterology;  Laterality: N/A;   DILATION AND CURETTAGE OF UTERUS  1956-57   ESOPHAGEAL DILATION N/A 02/01/2017   Procedure: ESOPHAGEAL DILATION;  Surgeon: Jinny Carmine, MD;  Location: [facility];  Service: Gastroenterology;  Laterality: N/A;   ESOPHAGOGASTRODUODENOSCOPY N/A 02/01/2017   Procedure: ESOPHAGOGASTRODUODENOSCOPY (EGD);  Surgeon: Jinny Carmine, MD;  Location: [facility];  Service: Gastroenterology;  Laterality: N/A;   EYE SURGERY Bilateral February 2024   Cataract surgery   HAMMER TOE  SURGERY Bilateral    OVARIAN CYST REMOVAL  1967   THYROIDECTOMY, PARTIAL     TONSILLECTOMY      Family History: Family History  Problem Relation Age of Onset   Arthritis Mother    Cancer Mother        lung   Lung disease Mother    Osteoporosis Mother    Alcohol abuse Mother    Anxiety disorder Mother    COPD Mother    Depression Mother    Arrhythmia Mother    Diabetes Father    Stroke Father    Heart disease Father    Hearing loss Father    Heart disease Sister    Lung disease Sister    Osteoporosis Sister    Colon polyps Sister    Alcohol abuse Sister    COPD Sister    Depression Sister    Hearing loss Sister    Early death Brother    Mental illness Brother    Liver disease Brother    Alcohol abuse Brother    Drug abuse Brother    Alcohol abuse Maternal Grandfather    Alcohol abuse Maternal Uncle    Birth defects Son    Breast cancer Neg Hx     Social History:  reports that she has never smoked. She has never used smokeless tobacco. She reports that she does not drink alcohol and does not use drugs.  Physical Exam: BP 114/77   Pulse 84   Temp ROLLEN)  97.5 F (36.4 C) (Temporal)   Resp 16   Ht 5' 4.5 (1.638 m)   Wt 56.2 kg   SpO2 98%   BMI 20.96 kg/m    Constitutional:  Alert and oriented, No acute distress. Cardiovascular: Regular rate and rhythm Respiratory: Clear to auscultation bilaterally GI: Abdomen is soft, nontender, nondistended, no abdominal masses   Laboratory Data: Culture 1/13 no growth   Assessment & Plan:   79 year old female with history of urethral stricture in 2022 with dilation in clinic by Dr. Rosina Riis and subsequent burning for at least 1 year.  Recently found to have recurrence of urethral stricture on attempted cystoscopy.  We discussed other alternatives like Optilume balloon dilation. I reviewed data that does show success in female urethral stricture disease with Optilume balloon. Will also perform cystoscopy at that  time. Risks of dysuria, urgency/frequency, incontinence, recurrence were reviewed.   Cystoscopy, Optilume balloon dilation   Redell Burnet, MD 06/16/2024  [facility] [address] [place], [place] [address] [phone]   "

## 2024-06-16 NOTE — Transfer of Care (Signed)
 Immediate Anesthesia Transfer of Care Note  Patient: Sonya Barnes  Procedure(s) Performed: CYSTOURETHROSCOPY, WITH URETHRAL STRICTURE DILATION USING DRUG-COATED BALLOON (Urethra)  Patient Location: PACU  Anesthesia Type:General  Level of Consciousness: drowsy and patient cooperative  Airway & Oxygen Therapy: Patient Spontanous Breathing and Patient connected to face mask oxygen  Post-op Assessment: Report given to RN and Post -op Vital signs reviewed and stable  Post vital signs: Reviewed and stable  Last Vitals:  Vitals Value Taken Time  BP    Temp    Pulse    Resp    SpO2      Last Pain:  Vitals:   06/16/24 1339  TempSrc: Temporal  PainSc: 0-No pain         Complications: No notable events documented.

## 2024-06-19 ENCOUNTER — Encounter: Admitting: Physician Assistant

## 2024-06-19 ENCOUNTER — Encounter: Payer: Self-pay | Admitting: Urology

## 2024-06-20 ENCOUNTER — Encounter: Admitting: Physician Assistant

## 2024-06-20 ENCOUNTER — Encounter: Payer: Self-pay | Admitting: Physician Assistant

## 2024-06-20 ENCOUNTER — Ambulatory Visit (INDEPENDENT_AMBULATORY_CARE_PROVIDER_SITE_OTHER): Admitting: Physician Assistant

## 2024-06-20 VITALS — BP 130/73 | HR 66 | Ht 64.5 in | Wt 124.0 lb

## 2024-06-20 DIAGNOSIS — N3592 Unspecified urethral stricture, female: Secondary | ICD-10-CM

## 2024-07-25 ENCOUNTER — Ambulatory Visit: Admitting: Urology

## 2024-11-06 ENCOUNTER — Ambulatory Visit: Admitting: Family Medicine
